# Patient Record
Sex: Female | Born: 1935 | Race: White | Hispanic: No | Smoking: Former smoker
Health system: Southern US, Community
[De-identification: ages and names within clinical notes are randomized; demographics above are authoritative.]

## PROBLEM LIST (undated history)

## (undated) DIAGNOSIS — N289 Disorder of kidney and ureter, unspecified: Secondary | ICD-10-CM

## (undated) DIAGNOSIS — F329 Major depressive disorder, single episode, unspecified: Secondary | ICD-10-CM

## (undated) DIAGNOSIS — I10 Essential (primary) hypertension: Secondary | ICD-10-CM

## (undated) DIAGNOSIS — J189 Pneumonia, unspecified organism: Secondary | ICD-10-CM

## (undated) DIAGNOSIS — G2581 Restless legs syndrome: Secondary | ICD-10-CM

## (undated) DIAGNOSIS — N318 Other neuromuscular dysfunction of bladder: Secondary | ICD-10-CM

## (undated) DIAGNOSIS — R413 Other amnesia: Secondary | ICD-10-CM

## (undated) DIAGNOSIS — G4489 Other headache syndrome: Secondary | ICD-10-CM

## (undated) DIAGNOSIS — N183 Chronic kidney disease, stage 3 (moderate): Secondary | ICD-10-CM

## (undated) DIAGNOSIS — M150 Primary generalized (osteo)arthritis: Secondary | ICD-10-CM

## (undated) DIAGNOSIS — M810 Age-related osteoporosis without current pathological fracture: Secondary | ICD-10-CM

## (undated) DIAGNOSIS — K219 Gastro-esophageal reflux disease without esophagitis: Secondary | ICD-10-CM

## (undated) DIAGNOSIS — S72009A Fracture of unspecified part of neck of unspecified femur, initial encounter for closed fracture: Secondary | ICD-10-CM

## (undated) DIAGNOSIS — I739 Peripheral vascular disease, unspecified: Secondary | ICD-10-CM

## (undated) DIAGNOSIS — H409 Unspecified glaucoma: Secondary | ICD-10-CM

## (undated) DIAGNOSIS — R350 Frequency of micturition: Secondary | ICD-10-CM

## (undated) DIAGNOSIS — N3281 Overactive bladder: Secondary | ICD-10-CM

## (undated) HISTORY — DX: Other neuromuscular dysfunction of bladder: N31.8

## (undated) HISTORY — DX: Age-related osteoporosis without current pathological fracture: M81.0

## (undated) HISTORY — DX: Gastro-esophageal reflux disease without esophagitis: K21.9

## (undated) HISTORY — DX: Other amnesia: R41.3

## (undated) HISTORY — DX: Disorder of kidney and ureter, unspecified: N28.9

## (undated) HISTORY — DX: Restless legs syndrome: G25.81

## (undated) HISTORY — DX: Other headache syndrome: G44.89

## (undated) HISTORY — DX: Frequency of micturition: R35.0

---
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
## 1984-04-04 HISTORY — PX: ROTATOR CUFF REPAIR: SHX139

## 1999-04-05 HISTORY — PX: ROTATOR CUFF REPAIR: SHX139

## 2003-06-11 ENCOUNTER — Ambulatory Visit (HOSPITAL_COMMUNITY): Admission: RE | Admit: 2003-06-11 | Discharge: 2003-06-11 | Payer: Self-pay | Admitting: Internal Medicine

## 2003-07-31 ENCOUNTER — Ambulatory Visit (HOSPITAL_COMMUNITY): Admission: RE | Admit: 2003-07-31 | Discharge: 2003-07-31 | Payer: Self-pay | Admitting: Vascular Surgery

## 2004-04-04 HISTORY — PX: CARPAL TUNNEL RELEASE: SHX101

## 2005-06-01 ENCOUNTER — Encounter: Admission: RE | Admit: 2005-06-01 | Discharge: 2005-06-01 | Payer: Self-pay | Admitting: Internal Medicine

## 2005-08-09 ENCOUNTER — Encounter: Admission: RE | Admit: 2005-08-09 | Discharge: 2005-08-09 | Payer: Self-pay | Admitting: Internal Medicine

## 2006-05-04 ENCOUNTER — Encounter: Admission: RE | Admit: 2006-05-04 | Discharge: 2006-05-04 | Payer: Self-pay | Admitting: *Deleted

## 2006-08-01 ENCOUNTER — Ambulatory Visit: Payer: Self-pay | Admitting: Vascular Surgery

## 2006-08-07 ENCOUNTER — Encounter: Payer: Self-pay | Admitting: Vascular Surgery

## 2006-08-07 ENCOUNTER — Ambulatory Visit: Payer: Self-pay | Admitting: Vascular Surgery

## 2006-08-07 ENCOUNTER — Inpatient Hospital Stay (HOSPITAL_COMMUNITY): Admission: EM | Admit: 2006-08-07 | Discharge: 2006-08-13 | Payer: Self-pay | Admitting: Emergency Medicine

## 2006-08-07 ENCOUNTER — Encounter (INDEPENDENT_AMBULATORY_CARE_PROVIDER_SITE_OTHER): Payer: Self-pay | Admitting: Cardiology

## 2006-08-22 ENCOUNTER — Encounter: Admission: RE | Admit: 2006-08-22 | Discharge: 2006-08-22 | Payer: Self-pay | Admitting: *Deleted

## 2008-08-07 ENCOUNTER — Encounter: Admission: RE | Admit: 2008-08-07 | Discharge: 2008-08-07 | Payer: Self-pay | Admitting: *Deleted

## 2009-02-09 ENCOUNTER — Encounter: Admission: RE | Admit: 2009-02-09 | Discharge: 2009-02-09 | Payer: Self-pay | Admitting: Internal Medicine

## 2009-05-02 ENCOUNTER — Inpatient Hospital Stay (HOSPITAL_COMMUNITY): Admission: EM | Admit: 2009-05-02 | Discharge: 2009-05-05 | Payer: Self-pay | Admitting: Emergency Medicine

## 2009-08-27 ENCOUNTER — Encounter: Admission: RE | Admit: 2009-08-27 | Discharge: 2009-08-27 | Payer: Self-pay | Admitting: Internal Medicine

## 2009-08-27 LAB — HM MAMMOGRAPHY: HM Mammogram: NORMAL

## 2010-01-26 ENCOUNTER — Ambulatory Visit (HOSPITAL_COMMUNITY): Admission: RE | Admit: 2010-01-26 | Discharge: 2010-01-26 | Payer: Self-pay | Admitting: Internal Medicine

## 2010-01-30 ENCOUNTER — Ambulatory Visit: Payer: Self-pay | Admitting: Cardiology

## 2010-01-30 ENCOUNTER — Inpatient Hospital Stay (HOSPITAL_COMMUNITY): Admission: EM | Admit: 2010-01-30 | Discharge: 2010-02-02 | Payer: Self-pay | Admitting: Emergency Medicine

## 2010-01-31 ENCOUNTER — Encounter (INDEPENDENT_AMBULATORY_CARE_PROVIDER_SITE_OTHER): Payer: Self-pay | Admitting: Internal Medicine

## 2010-04-25 ENCOUNTER — Encounter: Payer: Self-pay | Admitting: *Deleted

## 2010-06-15 LAB — BASIC METABOLIC PANEL
BUN: 18 mg/dL (ref 6–23)
Calcium: 9.4 mg/dL (ref 8.4–10.5)
GFR calc non Af Amer: 56 mL/min — ABNORMAL LOW (ref >60)
Glucose, Bld: 101 mg/dL — ABNORMAL HIGH (ref 70–99)
Sodium: 141 mEq/L (ref 135–145)

## 2010-06-16 LAB — CARDIAC PANEL(CRET KIN+CKTOT+MB+TROPI)
CK, MB: 3.3 ng/mL (ref 0.3–4.0)
CK, MB: 3.9 ng/mL (ref 0.3–4.0)
Troponin I: 0.02 ng/mL (ref 0.00–0.06)
Troponin I: 0.02 ng/mL (ref 0.00–0.06)

## 2010-06-16 LAB — URINE CULTURE
Colony Count: NO GROWTH
Culture  Setup Time: 201110291426

## 2010-06-16 LAB — CBC
HCT: 35.5 % — ABNORMAL LOW (ref 36.0–46.0)
Hemoglobin: 11.8 g/dL — ABNORMAL LOW (ref 12.0–15.0)
MCH: 31.4 pg (ref 26.0–34.0)
MCH: 31.4 pg (ref 26.0–34.0)
MCH: 31.4 pg (ref 26.0–34.0)
MCHC: 33.9 g/dL (ref 30.0–36.0)
MCHC: 33.9 g/dL (ref 30.0–36.0)
MCHC: 34 g/dL (ref 30.0–36.0)
MCV: 92.3 fL (ref 78.0–100.0)
MCV: 92.5 fL (ref 78.0–100.0)
MCV: 92.7 fL (ref 78.0–100.0)
Platelets: 119 10*3/uL — ABNORMAL LOW (ref 150–400)
RBC: 3.77 MIL/uL — ABNORMAL LOW (ref 3.87–5.11)
RDW: 13.4 % (ref 11.5–15.5)
RDW: 13.7 % (ref 11.5–15.5)

## 2010-06-16 LAB — BASIC METABOLIC PANEL
BUN: 21 mg/dL (ref 6–23)
BUN: 24 mg/dL — ABNORMAL HIGH (ref 6–23)
BUN: 24 mg/dL — ABNORMAL HIGH (ref 6–23)
CO2: 21 mEq/L (ref 19–32)
CO2: 21 mEq/L (ref 19–32)
CO2: 22 mEq/L (ref 19–32)
Calcium: 8.7 mg/dL (ref 8.4–10.5)
Chloride: 106 mEq/L (ref 96–112)
Chloride: 111 mEq/L (ref 96–112)
Chloride: 111 mEq/L (ref 96–112)
Creatinine, Ser: 1.01 mg/dL (ref 0.4–1.2)
Creatinine, Ser: 1.22 mg/dL — ABNORMAL HIGH (ref 0.4–1.2)
Glucose, Bld: 90 mg/dL (ref 70–99)
Potassium: 3.6 mEq/L (ref 3.5–5.1)

## 2010-06-16 LAB — CULTURE, BLOOD (ROUTINE X 2)

## 2010-06-16 LAB — CK TOTAL AND CKMB (NOT AT ARMC)
CK, MB: 3.5 ng/mL (ref 0.3–4.0)
Total CK: 56 U/L (ref 7–177)

## 2010-06-16 LAB — URINALYSIS, ROUTINE W REFLEX MICROSCOPIC
Bilirubin Urine: NEGATIVE
Nitrite: NEGATIVE
Protein, ur: 30 mg/dL — AB
Urobilinogen, UA: 1 mg/dL (ref 0.0–1.0)

## 2010-06-16 LAB — LACTIC ACID, PLASMA: Lactic Acid, Venous: 1.4 mmol/L (ref 0.5–2.2)

## 2010-06-16 LAB — VITAMIN B12: Vitamin B-12: 402 pg/mL (ref 211–911)

## 2010-06-20 LAB — BASIC METABOLIC PANEL
Calcium: 8.7 mg/dL (ref 8.4–10.5)
Creatinine, Ser: 0.87 mg/dL (ref 0.4–1.2)
GFR calc Af Amer: >60 mL/min (ref >60)
GFR calc non Af Amer: >60 mL/min (ref >60)
Glucose, Bld: 110 mg/dL — ABNORMAL HIGH (ref 70–99)
Sodium: 141 mEq/L (ref 135–145)

## 2010-06-20 LAB — COMPREHENSIVE METABOLIC PANEL
ALT: 10 U/L (ref 0–35)
ALT: 14 U/L (ref 0–35)
AST: 21 U/L (ref 0–37)
Albumin: 3.2 g/dL — ABNORMAL LOW (ref 3.5–5.2)
Albumin: 4.3 g/dL (ref 3.5–5.2)
Alkaline Phosphatase: 50 U/L (ref 39–117)
Alkaline Phosphatase: 72 U/L (ref 39–117)
BUN: 33 mg/dL — ABNORMAL HIGH (ref 6–23)
BUN: 55 mg/dL — ABNORMAL HIGH (ref 6–23)
Chloride: 111 mEq/L (ref 96–112)
GFR calc Af Amer: 34 mL/min — ABNORMAL LOW (ref >60)
Glucose, Bld: 92 mg/dL (ref 70–99)
Potassium: 4 mEq/L (ref 3.5–5.1)
Potassium: 4.1 mEq/L (ref 3.5–5.1)
Sodium: 142 mEq/L (ref 135–145)
Total Bilirubin: 1.5 mg/dL — ABNORMAL HIGH (ref 0.3–1.2)
Total Protein: 7.4 g/dL (ref 6.0–8.3)

## 2010-06-20 LAB — CULTURE, BLOOD (ROUTINE X 2)

## 2010-06-20 LAB — POCT CARDIAC MARKERS
CKMB, poc: 2 ng/mL (ref 1.0–8.0)
Troponin i, poc: <0.05 ng/mL (ref 0.00–0.09)

## 2010-06-20 LAB — CBC
HCT: 37.5 % (ref 36.0–46.0)
Hemoglobin: 12.5 g/dL (ref 12.0–15.0)
Hemoglobin: 12.8 g/dL (ref 12.0–15.0)
Platelets: 134 10*3/uL — ABNORMAL LOW (ref 150–400)
Platelets: 173 10*3/uL (ref 150–400)
RDW: 13.2 % (ref 11.5–15.5)
RDW: 13.3 % (ref 11.5–15.5)
WBC: 7.6 10*3/uL (ref 4.0–10.5)

## 2010-06-20 LAB — PROTIME-INR: INR: 1.03 (ref 0.00–1.49)

## 2010-06-20 LAB — URINALYSIS, ROUTINE W REFLEX MICROSCOPIC
Bilirubin Urine: NEGATIVE
Ketones, ur: NEGATIVE mg/dL
Nitrite: NEGATIVE
Specific Gravity, Urine: 1.023 (ref 1.005–1.030)
Urobilinogen, UA: 0.2 mg/dL (ref 0.0–1.0)

## 2010-06-20 LAB — DIFFERENTIAL
Basophils Relative: 0 % (ref 0–1)
Monocytes Absolute: 0.7 10*3/uL (ref 0.1–1.0)
Monocytes Relative: 7 % (ref 3–12)
Neutro Abs: 9.8 10*3/uL — ABNORMAL HIGH (ref 1.7–7.7)

## 2010-06-20 LAB — SODIUM, URINE, RANDOM: Sodium, Ur: 48 mEq/L

## 2010-06-20 LAB — MICROALBUMIN, URINE: Microalb, Ur: 15.65 mg/dL — ABNORMAL HIGH (ref 0.00–1.89)

## 2010-06-20 LAB — PROTEIN, URINE, RANDOM: Total Protein, Urine: 39 mg/dL

## 2010-06-20 LAB — SEDIMENTATION RATE: Sed Rate: 7 mm/hr (ref 0–22)

## 2010-08-17 NOTE — Discharge Summary (Signed)
NAMEREISE, HIETALA                ACCOUNT NO.:  [id number]   MEDICAL RECORD NO.:  [id number]          PATIENT TYPE:  INP   LOCATION:  6739                         FACILITY:  [facility]   PHYSICIAN:  Barnetta Chapel, MDDATE OF BIRTH:  12-Oct-1917   DATE OF ADMISSION:  08/07/2006  DATE OF DISCHARGE:  08/13/2006                               DISCHARGE SUMMARY   </PRIMARY CARE PHYSICIAN  Nicholes Calamity MD   ADMISSION DIAGNOSES:  1. Pneumonia.  2. Hypoxemia.  3. Hypokalemia.  4. Hyponatremia.  5. Hypertension.   DISCHARGE DIAGNOSES:  1. Left lower lobe pneumonia.  2. Hypoxemia secondary to pneumonia, resolved.  3. Hypokalemia, resolved.  4. Hyponatremia, resolved.  5. Hypertension.  6. Acute confusional state (delirium), likely secondary to underlying      pneumonia.   DISCHARGE MEDICATIONS:  1. Hytrin 2 mg p.o. 1 daily.  2. Pletal 50 mg p.o. b.i.d.  3. Lisinopril 40 mg 1 daily.  4. Omeprazole 20 mg p.o. 1 daily.  5. Requip 0.5 mg p.o. nightly.  6. Aspirin 325 mg p.o. 1 daily.  7. Pamelor eye drops 0.25% 1 drop to both eyes once daily.  8. Travatan 0.004% 1 drop both eyes nightly.  9. Celexa 20 mg p.o. once daily.  10.Clonidine 0.1 mg p.o. 3 times a day.  11.__________ p.o. once daily.  12.__________ 100 mg daily.  13.Risperidone 0.25 mg p.o. b.i.d. x1 week.  14.Levaquin 750 mg p.o. once daily for only 5 days and then stop.   CONSULTATIONS:  None.   IMAGING STUDIES:  1. CT of the chest done Aug 08, 2006.  This revealed left lower lobe      consolidation consistent with infectious infiltrate with lateral      adenopathy and narrowing of the pulmonary arteries.  No masses      identified.  Please repeat CT scan of the chest in 1-3 months to      ensure improvement in the consolidation and to rule out any      underlying mass.  The CT chest also showed fluid adjacent to the      liver.  It showed dilated right collecting system (this is      apparently congenital  according to the patient records.)  2. chest x-ray done the 5th of May showed pleural-based density on the      right chest that could represent possible pneumonia versus a mass.  3. CT scan of the head done on Aug 07, 2006, revealed atrophy and      chronic small vessel ischemic changes, no acute abnormality.  4. CT scan of the abdomen and pelvis done on Aug 09, 2006, revealed      dilated right renal pelvicaliceal system most likely secondary to a      right UPJ stenosis.  Diffuse edematous changes within the      subcutaneous fat and intraperitoneal fat, probable perineal      __________.  Mild splenomegaly.  Small simple-appearing left lobe      hepatic cyst.  Bilateral pleural effusions and left lower lobe  consolidative changes.  Diffuse changes of lumbar spondylosis.      Sigmoid colonic diverticulosis without evidence of diverticulitis.   BRIEF HISTORY AND HOSPITAL COURSE:  Please refer to the H&P done on Aug 07, 2006.  The patient is an 77 year old woman with multiple medical  problems.  The patient presented with a 2-day history of feeling sick,  not feeling well with cramping of the legs.  Appetite was also poor  prior to admission.  She had a cough which was productive of yellowish  sputum.  She was short of breath.  On admission, the patient's blood  pressure was 172/83 mmHg.  The temperature was 101.3.  The patient was  admitted to telemetry floor.  She was started on IV Avelox 400 mg once  daily.  She was adequately rehydrated.  She had a potassium of 3.3 on  admission, and sodium of 130.  Potassium was repleted.  With above  management, the hypoxia improved.  The patient's fever also resolved.  A  chest x-ray done on admission revealed a possible left-sided pneumonia.  CT scan was essentially as above.  A CT of the abdomen and pelvis was  done to further evaluate the findings on the CT of the chest.  Please  see above.   The patient has done well and will be discharged  back home to the care  of the primary care physician.  During her stay in the hospital, she  developed acute confusion.  She pulled her IV lines out.  She was  started on risperidone 0.25 mg once daily.  The acute confusion has  resolved.  She will be continued on risperidone for the next one week.   DISCHARGE PLANS:  1. Discharge patient back home today.  2. Follow up with primary care Victory Strollo in 1 week.  3. Cardiac diet.  4. Activity as tolerated.  5. Primary care Vanice Rappa should consider repeating CT scan in 6-12      weeks to ensure clearing of the left lower lobe consolidation.      Barnetta Chapel, MD  Electronically Signed     SIO/MEDQ  D:  08/13/2006  T:  08/13/2006  Job:  [id number]   cc:   D. Oley Balm III, M.D.

## 2010-08-20 NOTE — Op Note (Signed)
NAME:  Carrie Moran, Carrie Moran                          ACCOUNT NO.:  [id number]   MEDICAL RECORD NO.:  [id number]                   PATIENT TYPE:  OIB   LOCATION:  2899                                 FACILITY:  [facility]   PHYSICIAN:  Quita Skye. Hart Rochester, M.D.               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   PROCEDURE:  Abdominal aortogram with bilateral lower extremities run-off via  right common femoral approach.   SURGEON:  Quita Skye. Hart Rochester, M.D.   ANESTHESIA:  Local Xylocaine.   CONTRAST:  125 cc.   COMPLICATIONS:  None.  Labetalol 20 mg given intravenously for hypertension.   DESCRIPTION OF PROCEDURE:  The patient was taken to the [facility]  Peripheral Endovascular Lab, placed in the supine position at which time  both groins were prepped with Betadine scrub and solution, draped in routine  sterile manner. After infiltration with 1% Xylocaine, the right common  femoral artery was entered percutaneously using standard approach.  Guide  wire was passed into the suprarenal aorta under fluoroscopic guidance.   A 5 French sheath and dilator were passed over the guide wire, dilator  removed and a standard pigtail catheter positioned in the  suprarenal aorta.  Flush abdominal aortogram was then performed, injecting  20 cc of contrast at 20 cc/second.  This revealed the aorta to be widely  patent with single renal arteries bilaterally which had no areas of  stenosis.  Right kidney was slightly smaller than the left.  There was a  normal aortic bifurcation with mild plaque formation and both common  external and internal iliac arteries were also widely patent.  The catheter  was delivered into  the terminal aorta and bilateral lower extremities run-  off performed, injecting 98 cc of contrast at 8 cc/second.   This revealed the right leg to have a widely patent common femoral artery.  There was mild superficial femoral occlusive disease with no areas of  significant stenosis and 3-vessel run-off on the right through small, tibial  vessels.  On the left side, the common femoral and profunda femoris arteries  were widely patent. The superficial femoral artery was widely patent  proximally but became moderately to severely diseased in the proximal to mid-  thigh throughout with plaque running through this whole entire area.  The 2  areas of most significant stenosis proximally, 1 at approximately 80% and  the other at approximately 90%.  Distal superficial femoral and popliteal  arteries were widely patent with 3 vessel run-off on the left. Additional  views were obtained using a peak whole technique.   When this was completed, catheter was removed  over the guide wire, sheaths  removed, adequate compression applied. No complications ensued.   FINDINGS:  1. Left superficial femoral occlusive disease with 2 areas of moderate to     severe stenosis approximating 80-90% in     the proximal third of the thigh with normal run-off distally.  2. Mild right superficial femoral occlusive disease with normal run-off     distally.  3. Normal aortoiliac systems.                                               Quita Skye Hart Rochester, M.D.    JDL/MEDQ  D:  07/31/2003  T:  07/31/2003  Job:  [id number]

## 2010-08-20 NOTE — H&P (Signed)
Carrie Moran, Carrie Moran                ACCOUNT NO.:  [id number]   MEDICAL RECORD NO.:  [id number]          PATIENT TYPE:  INP   LOCATION:  6739                         FACILITY:  [facility]   PHYSICIAN:  Michelene Gardener, MD    DATE OF BIRTH:  09/29/1917   DATE OF ADMISSION:  08/07/2006  DATE OF DISCHARGE:                              HISTORY & PHYSICAL   PRIMARY CARE PHYSICIAN:  The patient has been seen at [facility] at [facility] and has been followed by Dr.  Deanne Coffer.   CHIEF COMPLAINT:  The patient was brought with multiple complaints  including feeling sick and poor appetite and cough for the last 2 days.   HISTORY OF PRESENT ILLNESS:  This is an 77 year old Caucasian female  with past medical history of multiple medical problems.  She presented  with the above-mentioned complaint.  Her daughter is present at the  bedside, and information was obtained from herself and her daughter.  For the last 2 days, she stated that she had been feeling sick and not  feeling well and had a lot of campy legs.  She was also complaining of  poor appetite.  Her symptoms progressed, and she developed increasing  cough which started as a dry cough and progressed to production with  white sputum initially and then developed into yellow sputum. That was  associated with increasing shortness of breath that has been going on  for the last 2 days and worsened today.  She denies wheeze.  There is no  fever, and there is no chest pain.  She came into the ER today because  she felt more febrile, and shortness of breath was worsening.   Initial vital signs in the ER showed blood pressure 172/83, temperature  101.3, pulse 95, respiratory rate 16.  Currently she is lying down with  no acute distress and was complaining of mild shortness of breath on and  off.   PAST MEDICAL HISTORY:  Significant for:  1. Arthritis.  2. History of DVT.  The patient is not taking Coumadin.  3. Hypertension.  4.  Migraine.  5. Renal insufficiency.  6. Osteoporosis.  7. GERD.  8. Restless leg syndrome.  9. History of glaucoma.   PAST SURGICAL HISTORY:  Denied.   MEDICATIONS:  1. Terazosin 4 mg p.o. once daily.  2. Actonel 35 mg p.o. once daily.  3. Lisinopril 40 mg p.o. once daily.  4. Omeprazole 20 mg p.o. twice daily.  5. Tramadol 50 mg as needed.  6. Requip 0.5 mg p.o. once daily.  7. Os-Cal Plus D 1 tablet p.o. twice daily.  8. Aspirin 81 mg p.o. once daily.  9. Timolol 0.5 mL 1 drop both eyes twice daily.  10.Travatan 1 drop once daily.  11.Citalopram, unknown dose, one daily.   SOCIAL HISTORY:  The patient denies smoking, denies alcohol drinking,  and denies recreational drugs.   FAMILY HISTORY:  Significant for hypertension on her father's side.  History of COPD on her mother's side.   REVIEW OF SYSTEMS:  CONSTITUTIONAL: Positive for fatigability, chronic  headache,  and poor appetite.  EYES: No blurred vision.  ENT:  Not  icterus.  No difficulty swallowing.  RESPIRATORY:  Positive for  productive cough with yellow sputum and increasing shortness of breath.  There are no wheezes and no chest pain.  CARDIOVASCULAR:  No chest pain,  no palpitations, no syncope.  GI: No nausea, vomiting, diarrhea.  GU:  No dysuria, hematuria.  ENDOCRINE:  No polyuria, no nocturia.  HEMATOLOGIC:  No bruising, no bleeding.  INFECTIOUS DISEASE:  No rashes,  no lesions.  NEUROLOGIC: No tingling and no weakness.  The rest of  systems reviewed and negative.   PHYSICAL EXAMINATION:  VITAL SIGNS:  Temperature 101.3, heart rate 75,  respiratory rate 18, blood pressure 172/83.  GENERAL APPEARANCE:  This is an elderly Caucasian female with mild  shortness of breath.  HEENT:  Conjunctivae showed no erythema and no pallor.  Pupils equal,  round, and reactive to light.  She has some hearing impairment.  Her  ears did not show any evidence of infection.  No nose bleed and no  infection.  Oral mucosa is dry,  and there is no pharyngeal erythema.  NECK:  Supple.  There is no JVD, and there is no carotid bruit.  Thyroid  showed no enlargement.  CARDIOVASCULAR: S1 and S2 are regular.  There is a positive murmur best  heard in the mitral area, and there are no gallops.  RESPIRATORY:  The patient is breathing around 20.  There is no use of  accessory muscles.  No intracostal retractions.  There is increased  rhonchi, especially in the left lower lobe area with mild rhonchi in the  right upper area.  There are no wheezes and no rales.  ABDOMEN:  Soft.  There is no distention, no tenderness.  No  hepatosplenomegaly.  Bowel sounds normal.  EXTREMITIES: Lower extremities positive for +1 edema.  There are no  varicose veins.  SKIN:  No rash, no erythema.  NEUROLOGIC:  Cranial nerves are intact.  There is no evidence of  neurological abnormalities.   LABORATORY RESULTS:  WBC 5.2, hemoglobin 12.4, hematocrit 36.5, MCV  91.5, platelet count 95.  Sodium 134, potassium 3.3, chloride 100, BUN  19, creatinine 1.1.   Chest x-ray showed infiltrate in the right lower lobe.   EKG is normal sinus rhythm, and there is no reversible ischemia.   ASSESSMENT:  1. Pneumonia.  Will admit this patient to the floor.  Already received      1 dose of antibiotics in the ER, and I will continue her on Avelox      400 mg IV once daily.  Two sets of blood cultures already from the      ER, so I will get a sputum culture.  Will apply oxygen as needed to      keep saturation above 92.  Will also give nebulizer treatments as      needed.  2. Hypoxemia secondary to underlying pneumonia.  Will treat with      oxygen as needed.  3. Hypokalemia.  Potassium is 3.3.  All of the medicines taken by the      patient will not cause hypokalemia.  I will get magnesium levels to      rule out the possibility of hypomagnesemia.  Will also give     potassium chloride 40 mEq x1 and will repeat potassium level in the      morning.  4.  Hyponatremia.  This is very mild hyponatremia  with sodium of 134      and no need for supplementation at this time, and we will just      watch sodium level tomorrow.  5. Hypertension.  Blood pressure has been elevated initially which is      around 195 systolic on admission. This might be attributed to the      anxiety of the patient.  Repeat blood pressure is around 158/75.      The patient did not take her medications today.  I will restart      those and then follow her blood pressure and make adjustments      according to her hospital course.   DISPOSITION:  Otherwise, other medical conditions remain stable.  The  patient was maintained on same medications as taken at home.  I have  discussed this case with the patient and the family member available at  bedside.  The condition, prognosis, and management were explained to  them, and their questions were answered.   Time:  1 hour.      Michelene Gardener, MD  Electronically Signed     NAE/MEDQ  D:  08/07/2006  T:  08/07/2006  Job:  [id number]   cc:   D. Oley Balm III, M.D.

## 2012-10-16 ENCOUNTER — Ambulatory Visit (INDEPENDENT_AMBULATORY_CARE_PROVIDER_SITE_OTHER): Payer: Medicare Other | Admitting: Internal Medicine

## 2012-10-16 ENCOUNTER — Encounter: Payer: Self-pay | Admitting: Internal Medicine

## 2012-10-16 VITALS — BP 150/72 | HR 91 | Temp 97.4°F | Resp 14 | Wt 97.8 lb

## 2012-10-16 DIAGNOSIS — N3281 Overactive bladder: Secondary | ICD-10-CM

## 2012-10-16 DIAGNOSIS — I10 Essential (primary) hypertension: Secondary | ICD-10-CM

## 2012-10-16 DIAGNOSIS — R1031 Right lower quadrant pain: Secondary | ICD-10-CM

## 2012-10-16 DIAGNOSIS — N318 Other neuromuscular dysfunction of bladder: Secondary | ICD-10-CM

## 2012-10-16 MED ORDER — OXYBUTYNIN CHLORIDE 5 MG PO TABS: 5.0000 mg | ORAL_TABLET | Freq: Every day | ORAL | Status: DC

## 2012-10-16 MED ORDER — METOPROLOL TARTRATE 25 MG PO TABS: ORAL_TABLET | ORAL | Status: DC

## 2012-10-16 NOTE — Progress Notes (Signed)
Patient ID: Carrie Moran, female   DOB: 1918-02-05, 79 y.o.   MRN: [id number]  Chief Complaint  Patient presents with  . Hypertension  . urinary complaints   Allergies not on file  HPI- Patient here with her son. She normally follows in [place] hospital.  Elevated bp- has been running high intermittently. Denies any chest pain, headache, SOB. Taking clonidine 0.1 mg bid , amlodipine 2.5 mg daily and lopressor 50 mg once a day for now. Was on lopressor 50 mg bid ande was decreased to 50 mg once a day 2 month back- has ran out of it and [place] had thought of weaning her off it. Will start her back on it. she is also on amlodipine 2.5 mg daily for 2 months and has ran out of it. bp has been running high  Urinary frequency- increased frequency for sometime. No burning or pain. No blood in the urine. Also had severe right groin area pain 2 days back, non radiating. Feels strong odor to her urine. No pain at present  Review of Systems  Constitutional: Negative for fever, chills and diaphoresis.  Respiratory: Negative for shortness of breath.   Cardiovascular: Negative for chest pain and palpitations.  Gastrointestinal: Negative for heartburn, nausea and vomiting.  Musculoskeletal: Negative for myalgias.  Skin: Negative for rash.  Neurological: Negative for dizziness.  Psychiatric/Behavioral: Negative for depression.   BP 150/72  Pulse 91  Temp(Src) 97.4 F (36.3 C) (Oral)  Resp 14  Wt 97 lb 12.8 oz (44.362 kg)  SpO2 97%  gen- elderly frail lady in NAD HEENT- no pallor, no icterus, no LAD, MMM CVS- ns 1,s2, rrr respi CTAB abdo- no suprapubic tenderness, no abdominal tenderness, bs+, soft, no flank tenderness Ext- able to move all 4  ASSESSMENT/PLAN-  HTN- Stop the amlodipine Increase the lopressor to 50 mg in am and 25 mg in pm Continue prn clonidine Monitor home bp Reassess if no improvement  Urinary frequency- likely from overactive bladder. Will have her on oxybutynin 5 mg daily.  Monitor further.   RLQ pain-Due to flank and groin pain few days back and recurrent uti will get renal usg to rule out hydronephrosis and renal stone

## 2012-10-16 NOTE — Patient Instructions (Signed)
Stop taking amlodipine  Start new dose of metoprolol and oxybutynin

## 2012-10-17 DIAGNOSIS — R1031 Right lower quadrant pain: Secondary | ICD-10-CM | POA: Insufficient documentation

## 2012-10-17 DIAGNOSIS — I10 Essential (primary) hypertension: Secondary | ICD-10-CM | POA: Insufficient documentation

## 2012-10-17 DIAGNOSIS — N3281 Overactive bladder: Secondary | ICD-10-CM | POA: Insufficient documentation

## 2012-10-17 HISTORY — DX: Overactive bladder: N32.81

## 2012-10-19 ENCOUNTER — Telehealth: Payer: Self-pay | Admitting: Geriatric Medicine

## 2012-10-19 NOTE — Telephone Encounter (Signed)
Brett Canales called and said that the patient is in pain again and her urine is dark. Due to our office closing at 12, I suggested that he take her somewhere today to go get looked at. He said he would take her.

## 2012-10-23 ENCOUNTER — Ambulatory Visit
Admission: RE | Admit: 2012-10-23 | Discharge: 2012-10-23 | Disposition: A | Discharge status: Home / Self Care | Payer: Medicare Other | Source: Ambulatory Visit | Attending: Internal Medicine | Admitting: Internal Medicine

## 2012-10-23 DIAGNOSIS — R1031 Right lower quadrant pain: Secondary | ICD-10-CM

## 2012-10-24 ENCOUNTER — Other Ambulatory Visit: Payer: Self-pay | Admitting: Geriatric Medicine

## 2012-10-24 MED ORDER — CLONIDINE HCL 0.1 MG PO TABS: 0.1000 mg | ORAL_TABLET | Freq: Two times a day (BID) | ORAL | Status: AC

## 2012-10-30 ENCOUNTER — Ambulatory Visit: Payer: Medicare Other | Admitting: Internal Medicine

## 2012-10-30 DIAGNOSIS — Z0289 Encounter for other administrative examinations: Secondary | ICD-10-CM

## 2012-10-31 ENCOUNTER — Encounter (HOSPITAL_COMMUNITY): Payer: Self-pay | Admitting: *Deleted

## 2012-10-31 ENCOUNTER — Emergency Department (HOSPITAL_COMMUNITY)
Admission: EM | Admit: 2012-10-31 | Discharge: 2012-10-31 | Discharge status: Against Medical Advice | Payer: Medicare Other | Attending: Emergency Medicine | Admitting: Emergency Medicine

## 2012-10-31 DIAGNOSIS — S0993XA Unspecified injury of face, initial encounter: Secondary | ICD-10-CM | POA: Insufficient documentation

## 2012-10-31 DIAGNOSIS — I10 Essential (primary) hypertension: Secondary | ICD-10-CM | POA: Insufficient documentation

## 2012-10-31 DIAGNOSIS — Y9289 Other specified places as the place of occurrence of the external cause: Secondary | ICD-10-CM | POA: Insufficient documentation

## 2012-10-31 DIAGNOSIS — S46909A Unspecified injury of unspecified muscle, fascia and tendon at shoulder and upper arm level, unspecified arm, initial encounter: Secondary | ICD-10-CM | POA: Insufficient documentation

## 2012-10-31 DIAGNOSIS — W010XXA Fall on same level from slipping, tripping and stumbling without subsequent striking against object, initial encounter: Secondary | ICD-10-CM | POA: Insufficient documentation

## 2012-10-31 DIAGNOSIS — S4980XA Other specified injuries of shoulder and upper arm, unspecified arm, initial encounter: Secondary | ICD-10-CM | POA: Insufficient documentation

## 2012-10-31 DIAGNOSIS — Z79899 Other long term (current) drug therapy: Secondary | ICD-10-CM | POA: Insufficient documentation

## 2012-10-31 DIAGNOSIS — S199XXA Unspecified injury of neck, initial encounter: Secondary | ICD-10-CM | POA: Insufficient documentation

## 2012-10-31 DIAGNOSIS — Y9389 Activity, other specified: Secondary | ICD-10-CM | POA: Insufficient documentation

## 2012-10-31 HISTORY — DX: Essential (primary) hypertension: I10

## 2012-10-31 NOTE — ED Notes (Signed)
The pt fell yesterday  When she tripped going up steps.  She has had neck and lt shoulder pain since then

## 2012-11-01 ENCOUNTER — Ambulatory Visit (INDEPENDENT_AMBULATORY_CARE_PROVIDER_SITE_OTHER): Payer: Medicare Other | Admitting: Nurse Practitioner

## 2012-11-01 ENCOUNTER — Ambulatory Visit
Admission: RE | Admit: 2012-11-01 | Discharge: 2012-11-01 | Disposition: A | Discharge status: Home / Self Care | Payer: Medicare Other | Source: Ambulatory Visit | Attending: Nurse Practitioner | Admitting: Nurse Practitioner

## 2012-11-01 ENCOUNTER — Encounter: Payer: Self-pay | Admitting: Nurse Practitioner

## 2012-11-01 ENCOUNTER — Other Ambulatory Visit: Payer: Self-pay | Admitting: Geriatric Medicine

## 2012-11-01 VITALS — BP 148/88 | HR 56 | Temp 97.6°F | Resp 14 | Wt 103.2 lb

## 2012-11-01 DIAGNOSIS — S42002A Fracture of unspecified part of left clavicle, initial encounter for closed fracture: Secondary | ICD-10-CM

## 2012-11-01 DIAGNOSIS — T148XXA Other injury of unspecified body region, initial encounter: Secondary | ICD-10-CM

## 2012-11-01 DIAGNOSIS — W19XXXA Unspecified fall, initial encounter: Secondary | ICD-10-CM

## 2012-11-01 DIAGNOSIS — S42009A Fracture of unspecified part of unspecified clavicle, initial encounter for closed fracture: Secondary | ICD-10-CM

## 2012-11-01 MED ORDER — TRAMADOL HCL 50 MG PO TABS: 50.0000 mg | ORAL_TABLET | Freq: Three times a day (TID) | ORAL | Status: DC | PRN

## 2012-11-01 NOTE — Patient Instructions (Addendum)
Will get chest xray and xray of left shoulder  Can use aleve 1 tablet every 8 hours for pain (use for 5 days then stop) Use ice and heat for pain  May get OTC salonpas patch to apply to painful area or use one of the following-- biofreeze/icyhot/bengay Cont using tylenol 650 mg every 6 hours ( do not use more than 3000 mg in 24 hours)   Contusion A contusion is a deep bruise. Contusions are the result of an injury that caused bleeding under the skin. The contusion may turn blue, purple, or yellow. Minor injuries will give you a painless contusion, but more severe contusions may stay painful and swollen for a few weeks.  CAUSES  A contusion is usually caused by a blow, trauma, or direct force to an area of the body. SYMPTOMS   Swelling and redness of the injured area.  Bruising of the injured area.  Tenderness and soreness of the injured area.  Pain. DIAGNOSIS  The diagnosis can be made by taking a history and physical exam. An X-ray, CT scan, or MRI may be needed to determine if there were any associated injuries, such as fractures. TREATMENT  Specific treatment will depend on what area of the body was injured. In general, the best treatment for a contusion is resting, icing, elevating, and applying cold compresses to the injured area. Over-the-counter medicines may also be recommended for pain control. Ask your caregiver what the best treatment is for your contusion. HOME CARE INSTRUCTIONS   Put ice on the injured area.  Put ice in a plastic bag.  Place a towel between your skin and the bag.  Leave the ice on for 15-20 minutes, 3-4 times a day.  Only take over-the-counter or prescription medicines for pain, discomfort, or fever as directed by your caregiver. Your caregiver may recommend avoiding anti-inflammatory medicines (aspirin, ibuprofen, and naproxen) for 48 hours because these medicines may increase bruising.  Rest the injured area.  If possible, elevate the injured area  to reduce swelling. SEEK IMMEDIATE MEDICAL CARE IF:   You have increased bruising or swelling.  You have pain that is getting worse.  Your swelling or pain is not relieved with medicines. MAKE SURE YOU:   Understand these instructions.  Will watch your condition.  Will get help right away if you are not doing well or get worse. Document Released: 12/29/2004 Document Revised: 06/13/2011 Document Reviewed: 01/24/2011 [facility] Patient Information 2014 [place], [place].

## 2012-11-01 NOTE — Progress Notes (Signed)
Patient ID: Carrie Moran, female   DOB: [birth year], 79 y.o.   MRN: [id number]   No Known Allergies  Chief Complaint  Patient presents with  . Fell 2 days ago and bruised chest and painful    HPI: Patient is a 79 y.o. female seen in the office today due to fall 2 days ago---pt does not remember what happened; granddaughter providing history and she was not there when the fall happened (son is out of town) but it is reported she was stepping up stairs; unknown what she hit or where she hit. No loss of consciousness noted. Pt is now Very bruised in her chest and with increased pain. Hx of bilateral shoulder surgeries and has limited mobility and can not lift her hands over her head or rotate her should at baseline however reports pain in her left shoulder now.  No neurological deficits. Memory has gotten much worse in the last month Review of Systems:  Review of Systems  Constitutional: Negative for malaise/fatigue.  HENT: Negative for nosebleeds, congestion, sore throat and tinnitus.   Eyes: Negative for blurred vision, double vision and pain.  Respiratory: Negative for cough and shortness of breath.   Cardiovascular: Negative for chest pain and palpitations.  Gastrointestinal: Negative for abdominal pain, diarrhea and constipation.  Genitourinary: Negative for dysuria, urgency and frequency.  Musculoskeletal: Positive for myalgias, joint pain (left shoulder pain) and falls.  Skin:       Significant briusing throughout chest   Neurological: Negative for dizziness, weakness and headaches.  Psychiatric/Behavioral: Positive for memory loss.      Past Medical History  Diagnosis Date  . Hypertension    History reviewed. No pertinent past surgical history. Social History:   reports that she has quit smoking. She does not have any smokeless tobacco history on file. She reports that she does not drink alcohol or use illicit drugs.  No family history on file.  Medications: Patient's  Medications  New Prescriptions   No medications on file  Previous Medications   ASPIRIN 81 MG TABLET    Take 81 mg by mouth daily.   CILOSTAZOL (PLETAL) 50 MG TABLET    Take 50 mg by mouth 2 (two) times daily.   CLONIDINE (CATAPRES) 0.1 MG TABLET    Take 1 tablet (0.1 mg total) by mouth 2 (two) times daily.   LATANOPROST (XALATAN) 0.005 % OPHTHALMIC SOLUTION    Place 1 drop into both eyes daily. Instill 1 drop in each eye once daily for glaucoma.   METOPROLOL TARTRATE (LOPRESSOR) 25 MG TABLET    Take 25-50 mg by mouth 2 (two) times daily. 2 tabs in the am, 1 tab in the pm   MIRTAZAPINE (REMERON) 15 MG TABLET    Take 15 mg by mouth at bedtime.   OMEPRAZOLE (PRILOSEC) 20 MG CAPSULE    Take 40 mg by mouth daily.    OXYBUTYNIN (DITROPAN) 5 MG TABLET    Take 1 tablet (5 mg total) by mouth daily.   TIMOLOL MALEATE 0.5 % (DAILY) SOLN    Place 1 drop into both eyes daily.   Modified Medications   No medications on file  Discontinued Medications   No medications on file     Physical Exam:  Filed Vitals:   11/01/12 1051  BP: 148/88  Pulse: 56  Temp: 97.6 F (36.4 C)  TempSrc: Oral  Resp: 14  Weight: 103 lb 3.2 oz (46.811 kg)  Physical Exam  Constitutional: She is well-developed, well-nourished, and  in no distress. No distress.  HENT:  Head: Normocephalic and atraumatic.  Eyes: Conjunctivae and EOM are normal. Pupils are equal, round, and reactive to light.  Neck: Normal range of motion. Neck supple. No thyromegaly present.  Cardiovascular: Normal rate, regular rhythm and normal heart sounds.   Pulmonary/Chest: Effort normal and breath sounds normal. No respiratory distress.    briusing to chest wall with tenderness   Abdominal: Soft. Bowel sounds are normal. She exhibits no distension.  Musculoskeletal: She exhibits no edema.       Right shoulder: She exhibits decreased range of motion. She exhibits no tenderness, no swelling, no effusion and normal pulse.       Left shoulder: She  exhibits decreased range of motion, tenderness and decreased strength. She exhibits no swelling, no effusion and normal pulse.  Lymphadenopathy:    She has no cervical adenopathy.  Neurological: She is alert.  Skin: Skin is warm and dry. She is not diaphoretic.  ecchymosis thought out chest; bruising in different stages     Assessment/Plan  Fall, initial encounter and  Contusion of unspecified site At this time will get xrays; of chest and shoulder; to use aleve 1 tablet for 5 days to help with inflammation may use ice and heat topically to help with pain; also may use salonpas patch or another topical agent- may cont tylenol do not exceed 3000 mg in 24 hours  - DG Chest 1 View - DG Shoulder Left;  ------ Once xrays were done it was reported there was a Suspect a displaced fracture of the left clavicular head   Clavicle fracture, left, closed, initial encounter At this time will get a  Ambulatory referral to Orthopedic Surgery ASAP And give tramadol 50 mg q 8 hours as needed for pain   Pt needs routine follow up--- instructions given to granddaughter to have son call and make routine follow up appt as soon as possible. Will need MMSE

## 2012-11-02 ENCOUNTER — Other Ambulatory Visit: Payer: Self-pay | Admitting: Geriatric Medicine

## 2012-11-06 ENCOUNTER — Telehealth: Payer: Self-pay | Admitting: Geriatric Medicine

## 2012-11-06 NOTE — Telephone Encounter (Signed)
Patient has an appointment with you tomorrow at 2:45. Her son has decided to put her in a facility. The patient does not know that her son has made this decision. He would like for you to help break the news to his mother during the office visit that you and the family both as a whole think its best that she go into a facility. Ask [name] if you have any questions.

## 2012-11-07 ENCOUNTER — Ambulatory Visit (INDEPENDENT_AMBULATORY_CARE_PROVIDER_SITE_OTHER): Payer: Medicare Other | Admitting: Internal Medicine

## 2012-11-07 VITALS — BP 110/70 | HR 54 | Temp 97.4°F | Resp 16 | Wt 105.6 lb

## 2012-11-07 DIAGNOSIS — K219 Gastro-esophageal reflux disease without esophagitis: Secondary | ICD-10-CM

## 2012-11-07 DIAGNOSIS — F32A Depression, unspecified: Secondary | ICD-10-CM

## 2012-11-07 DIAGNOSIS — H409 Unspecified glaucoma: Secondary | ICD-10-CM

## 2012-11-07 DIAGNOSIS — Z111 Encounter for screening for respiratory tuberculosis: Secondary | ICD-10-CM

## 2012-11-07 DIAGNOSIS — I739 Peripheral vascular disease, unspecified: Secondary | ICD-10-CM

## 2012-11-07 DIAGNOSIS — R51 Headache: Secondary | ICD-10-CM

## 2012-11-07 DIAGNOSIS — N318 Other neuromuscular dysfunction of bladder: Secondary | ICD-10-CM

## 2012-11-07 DIAGNOSIS — F329 Major depressive disorder, single episode, unspecified: Secondary | ICD-10-CM | POA: Insufficient documentation

## 2012-11-07 DIAGNOSIS — N3281 Overactive bladder: Secondary | ICD-10-CM

## 2012-11-07 DIAGNOSIS — R519 Headache, unspecified: Secondary | ICD-10-CM | POA: Insufficient documentation

## 2012-11-07 DIAGNOSIS — I10 Essential (primary) hypertension: Secondary | ICD-10-CM

## 2012-11-07 HISTORY — DX: Depression, unspecified: F32.A

## 2012-11-07 HISTORY — DX: Unspecified glaucoma: H40.9

## 2012-11-07 HISTORY — DX: Peripheral vascular disease, unspecified: I73.9

## 2012-11-07 MED ORDER — OMEPRAZOLE 20 MG PO CPDR: 40.0000 mg | DELAYED_RELEASE_CAPSULE | Freq: Every day | ORAL | Status: DC

## 2012-11-07 MED ORDER — TOPIRAMATE 25 MG PO TABS: 25.0000 mg | ORAL_TABLET | Freq: Two times a day (BID) | ORAL | Status: AC

## 2012-11-07 NOTE — Progress Notes (Signed)
Patient ID: Carrie Moran, female   DOB: 06-Oct-1917, 79 y.o.   MRN: [id number]  Chief Complaint  Patient presents with  . Follow-up    Bp check,    HPI 79 y/o female patient here with her son. She has been getting her care at [facility] and will be transferring her care to our clinic for routine visits. She was seen few weeks back for OAB and HTN. Her bp medication was adjusted and she was also started on oxybuynin Her bp has been better controlled She has been going to the bathroom but frequency has decreased compared to before Her family wants her to be moved into assisted living because after summer break her daughter in law will be back to teaching in school and her son goes to work and there will be no one to take care of her at home. She had a mechanical fall recently and had fracture of head of her left clavicle  Review of Systems  Constitutional: Negative for fever, chills and diaphoresis.  Eyes: Negative for blurred vision.  Respiratory: Negative for cough and shortness of breath.   Cardiovascular: Negative for chest pain, palpitations and orthopnea.  Gastrointestinal: Negative for heartburn, nausea, vomiting, abdominal pain and diarrhea.  Genitourinary: Negative for dysuria.  Musculoskeletal: Negative for myalgias.  Skin: Negative for itching and rash.  Neurological: Negative for headaches.  Psychiatric/Behavioral: Negative for depression.    Physical exam  BP 110/70  Pulse 54  Temp(Src) 97.4 F (36.3 C) (Oral)  Resp 16  Wt 105 lb 9.6 oz (47.9 kg)  SpO2 95%  gen- elderly frail lady in NAD HEENT- no pallor, no icterus, no LAD, MMM CVS- ns 1,s2, rrr respi CTAB abdo- no suprapubic tenderness, no abdominal tenderness, bs+, soft, no flank tenderness Ext- able to move all 4, using a cane Neuro- aaox 3, no focal deficit  Assessment/plan  Talked with patient in detail about reason behind family deciding on sending her to assisted living. She wants to think about it but is  willing to get her PPD test done. Have placed ppd and to read it on 11/09/12. Will fill out FL2 form  OAB- continue oxybutynin  gerd- refill on omeprazole provided  HTN- bp better controlled. Continue amlodipine, clonidine and metoprolol. Continue asa  PVD- continue cilostazol and asa  Glaucoma- cotninue xalatan  Headache- refill on topiramate provided. Remains headache free  Depression- continue remeron as helping with her weight and appetite along with her mood  Will need records from [place] for review

## 2012-11-07 NOTE — Patient Instructions (Signed)
Come back on Friday 11/09/12 to read PPD. Your FL2 form will be ready for pick up

## 2012-11-08 ENCOUNTER — Telehealth: Payer: Self-pay | Admitting: *Deleted

## 2012-11-08 LAB — CBC WITH DIFFERENTIAL/PLATELET
Basophils Absolute: 0 10*3/uL (ref 0.0–0.2)
Eos: 3 % (ref 0–5)
Immature Grans (Abs): 0 10*3/uL (ref 0.0–0.1)
Immature Granulocytes: 0 % (ref 0–2)
Lymphocytes Absolute: 1.5 10*3/uL (ref 0.7–3.1)
MCHC: 32.6 g/dL (ref 31.5–35.7)
MCV: 93 fL (ref 79–97)
Monocytes Absolute: 0.5 10*3/uL (ref 0.1–0.9)
Monocytes: 8 % (ref 4–12)
Neutrophils Relative %: 62 % (ref 40–74)
RDW: 14 % (ref 12.3–15.4)
WBC: 5.8 10*3/uL (ref 3.4–10.8)

## 2012-11-08 LAB — COMPREHENSIVE METABOLIC PANEL
ALT: 8 IU/L (ref 0–32)
Albumin/Globulin Ratio: 1.9 (ref 1.1–2.5)
Alkaline Phosphatase: 62 IU/L (ref 39–117)
BUN/Creatinine Ratio: 29 — ABNORMAL HIGH (ref 11–26)
Chloride: 107 mmol/L (ref 97–108)
GFR calc Af Amer: 49 mL/min/{1.73_m2} — ABNORMAL LOW (ref >59)
GFR calc non Af Amer: 42 mL/min/{1.73_m2} — ABNORMAL LOW (ref >59)
Potassium: 5.3 mmol/L — ABNORMAL HIGH (ref 3.5–5.2)
Total Bilirubin: 0.4 mg/dL (ref 0.0–1.2)

## 2012-11-08 NOTE — Progress Notes (Signed)
Error

## 2012-11-12 NOTE — Telephone Encounter (Signed)
Addressed in OV

## 2012-11-30 DIAGNOSIS — N189 Chronic kidney disease, unspecified: Secondary | ICD-10-CM

## 2012-11-30 DIAGNOSIS — I129 Hypertensive chronic kidney disease with stage 1 through stage 4 chronic kidney disease, or unspecified chronic kidney disease: Secondary | ICD-10-CM

## 2012-11-30 DIAGNOSIS — F329 Major depressive disorder, single episode, unspecified: Secondary | ICD-10-CM

## 2012-11-30 DIAGNOSIS — R413 Other amnesia: Secondary | ICD-10-CM

## 2012-12-05 ENCOUNTER — Telehealth: Payer: Self-pay

## 2012-12-05 NOTE — Telephone Encounter (Signed)
Message left on triage VM, patient with elevated B/P 190/130. (FYI Message)  I called [facility] to speak with Isabell Jarvis was gone for the day.  I was told b/p was rechecked and resulted at 190/130, patient is asymptomatic.   Per Dr.Pandey recheck b/p after 20 min or resting/lying down. If b/p still elevated patient needs to be seen in ER for evaluation. Clarisse Gouge (nurse) verbalized understanding of Dr.Pandeys instructions

## 2012-12-11 ENCOUNTER — Other Ambulatory Visit: Payer: Self-pay | Admitting: *Deleted

## 2012-12-11 MED ORDER — HYDROCHLOROTHIAZIDE 12.5 MG PO TABS: ORAL_TABLET | ORAL | Status: DC

## 2012-12-14 ENCOUNTER — Telehealth: Payer: Self-pay

## 2012-12-14 NOTE — Telephone Encounter (Signed)
Message left on triage voicemail: B/P- 190/100 Pulse-66, patient is asymptomatic of TIA, denies chest pain. Patient is with frequent urination, denies burning.   Another message was also left from Carrie Moran requesting ok to refer patient for speech therapy for trouble swallowing   Please advise

## 2012-12-14 NOTE — Telephone Encounter (Signed)
Left message on voicemail for Carrie Moran informing her ok for speech therapy. Questioned if patient is following instructions from last OV

## 2012-12-14 NOTE — Telephone Encounter (Signed)
Ok to refer to speech therapy  Had made medication adjustment on reviewing your last note, is patient taking the new med that was started?

## 2012-12-19 NOTE — Telephone Encounter (Signed)
Spoke with Vernona Rieger, patient is following instructions given at last OV, patient was seen yesterday and B/P was improved- Vernona Rieger was unable to recall exact values and indicates this information is faxed to PCP daily.   Dr.Pandey please advise if any additional follow-up is needed

## 2012-12-19 NOTE — Telephone Encounter (Signed)
As per note bp appears normal. I would appreciate documented reading though. On review her bp on 9/12 14 was 200/110. If bp is < 140/90, it is good. Else notify the office

## 2012-12-25 ENCOUNTER — Telehealth: Payer: Self-pay

## 2012-12-25 NOTE — Telephone Encounter (Signed)
thanks

## 2012-12-25 NOTE — Telephone Encounter (Signed)
1.) Left message on VM for Lauren with Dr.Pandey's instructions. Medication list updated   2.) Orders signed and faxed back to Willadean Carol with clarification on medications and B/P protocol

## 2012-12-25 NOTE — Telephone Encounter (Signed)
See phone note dated 12/25/2012

## 2012-12-25 NOTE — Telephone Encounter (Signed)
1.) Triage message left indication patient's B/P is 180/110. Current weight 102.6 was previously 103. Patient also still complaining of urinary frequency. Patient is requesting order for ensure. Call was from Lauren @ [facility] [phone]   2.) Another triage message was left on this patient by Willadean Carol from [facility] [phone], Paper was sent requesting clarification on medications and a standing order on how to handle patient's elevated B/P readings (when to call PCP). Please review paperwork that was sent and advise ASAP.   Message to be sent and printed for PCP (Dr.Pandey) to review and advise

## 2012-12-25 NOTE — Telephone Encounter (Signed)
Carrie Moran calling from [place] with another b/p reading 215/120 (FYI)

## 2012-12-25 NOTE — Telephone Encounter (Signed)
Patient last seen in office on 8/14 and her blood pressure was stable that visit. It appears she is having several elevated bp readings. She was transferring her care from the [facility] hospital but records are still pending. Increase her HCTZ to 25 mg daily for now. Have her come to the office for follow up for elevated bp next week with a provider. Her urinary frequency will be addressed in that visit as well. It will be good to get her records from [place] prior to the visit

## 2012-12-26 ENCOUNTER — Other Ambulatory Visit: Payer: Self-pay | Admitting: *Deleted

## 2012-12-26 DIAGNOSIS — S42002A Fracture of unspecified part of left clavicle, initial encounter for closed fracture: Secondary | ICD-10-CM

## 2012-12-26 MED ORDER — TRAMADOL HCL 50 MG PO TABS: 50.0000 mg | ORAL_TABLET | Freq: Three times a day (TID) | ORAL | Status: DC | PRN

## 2012-12-27 ENCOUNTER — Ambulatory Visit: Payer: Self-pay | Admitting: Internal Medicine

## 2012-12-31 ENCOUNTER — Encounter: Payer: Self-pay | Admitting: Internal Medicine

## 2012-12-31 ENCOUNTER — Ambulatory Visit (INDEPENDENT_AMBULATORY_CARE_PROVIDER_SITE_OTHER): Payer: Medicare Other | Admitting: Internal Medicine

## 2012-12-31 VITALS — BP 190/100 | HR 57 | Temp 97.7°F | Wt 101.0 lb

## 2012-12-31 DIAGNOSIS — N318 Other neuromuscular dysfunction of bladder: Secondary | ICD-10-CM

## 2012-12-31 DIAGNOSIS — N3281 Overactive bladder: Secondary | ICD-10-CM

## 2012-12-31 DIAGNOSIS — I10 Essential (primary) hypertension: Secondary | ICD-10-CM

## 2012-12-31 MED ORDER — AMLODIPINE BESYLATE 5 MG PO TABS: 5.0000 mg | ORAL_TABLET | Freq: Every day | ORAL | Status: DC

## 2012-12-31 NOTE — Progress Notes (Signed)
Patient ID: Carrie Moran, female   DOB: 24-Nov-1917, 79 y.o.   MRN: [id number] Location:  [facility]  No Known Allergies  Chief Complaint  Patient presents with  . Hypertension    elevated B/P   . Urinary Frequency    ongoing concern- per son x several years    HPI: Patient is a 79 y.o. white female seen in the office today for elevated BP.   Son is with her and states that it has been high since her 79s. States that in the past every time she has been to the hospital it has been very high even though she is being managed. Over the past week BP medication has been increased but no changes in BP. Pt is asymptomatic when BP is high. Denies having a headache or changes in her vision. Pt. Has been at [facility] for three weeks before she lived her son. He states that he checked her BP every once in a while when she lived with him and most of the time it was high.  Son is considered that she has white coat syndrome and that elevates her BP. Considered that this new move may be contributing.   Review of Systems:  Review of Systems  Eyes: Negative for blurred vision and double vision.  Respiratory: Positive for shortness of breath.        With acitivity  Cardiovascular: Negative for chest pain.  Neurological: Negative for dizziness and headaches.       States not anymore than usual     Past Medical History  Diagnosis Date  . Hypertension   . Memory loss   . GERD (gastroesophageal reflux disease)   . RLS (restless legs syndrome)   . Hypertonicity of bladder   . Renal insufficiency   . Other headache syndromes(339.89)     History reviewed. No pertinent past surgical history.  Social History:   reports that she has quit smoking. She does not have any smokeless tobacco history on file. She reports that she does not drink alcohol or use illicit drugs.  No family history on file.  Medications: Patient's Medications  New Prescriptions    No medications on file  Previous Medications   ASPIRIN 81 MG TABLET    Take 81 mg by mouth daily.   CILOSTAZOL (PLETAL) 50 MG TABLET    Take 50 mg by mouth 2 (two) times daily.   CLONIDINE (CATAPRES) 0.1 MG TABLET    Take 1 tablet (0.1 mg total) by mouth 2 (two) times daily.   HYDROCHLOROTHIAZIDE (HYDRODIURIL) 25 MG TABLET    Take 25 mg by mouth daily.   LATANOPROST (XALATAN) 0.005 % OPHTHALMIC SOLUTION    Place 1 drop into both eyes daily. Instill 1 drop in each eye once daily for glaucoma.   METOPROLOL TARTRATE (LOPRESSOR) 25 MG TABLET    Take 50 mg by mouth 2 (two) times daily.    MIRTAZAPINE (REMERON) 15 MG TABLET    Take 15 mg by mouth at bedtime.   OMEPRAZOLE (PRILOSEC) 20 MG CAPSULE    Take 2 capsules (40 mg total) by mouth daily.   OXYBUTYNIN (DITROPAN) 5 MG TABLET    Take 1 tablet (5 mg total) by mouth daily.   TIMOLOL MALEATE 0.5 % (DAILY) SOLN    Place 1 drop into both eyes daily.    TOPIRAMATE (TOPAMAX) 25 MG TABLET    Take 1 tablet (25 mg total) by mouth 2 (two)  times daily.   TRAMADOL (ULTRAM) 50 MG TABLET    Take 1 tablet (50 mg total) by mouth every 8 (eight) hours as needed for pain.  Modified Medications   No medications on file  Discontinued Medications   No medications on file     Physical Exam: Filed Vitals:   12/31/12 1158  BP: 190/100  Pulse: 57  Temp: 97.7 F (36.5 C)  TempSrc: Oral  Weight: 101 lb (45.813 kg)  SpO2: 95%   Physical Exam  Constitutional: She appears well-developed and well-nourished.  Thin white female  Cardiovascular: Normal rate, regular rhythm, normal heart sounds and intact distal pulses.   Pulmonary/Chest: Effort normal and breath sounds normal.  Neurological: She is alert.  Skin: Skin is warm and dry.  Psychiatric: She has a normal mood and affect.     Labs reviewed: Basic Metabolic Panel:  Recent Labs  72/53/66 1545  NA 145*  K 5.3*  CL 107  CO2 26  GLUCOSE 100*  BUN 33  CREATININE 1.12*  CALCIUM 9.9   Liver  Function Tests:  Recent Labs  11/07/12 1545  AST 14  ALT 8  ALKPHOS 62  BILITOT 0.4  PROT 5.8*  CBC:  Recent Labs  11/07/12 1545  WBC 5.8  NEUTROABS 3.6  HGB 11.8  HCT 36.2  MCV 93    Assessment/Plan 1. Essential hypertension, benign -continue catapres 0.1mg  po bid, hctz 25mg  and lopressor 50mg  po bid - amLODipine (NORVASC) 5 MG tablet; Take 1 tablet (5 mg total) by mouth daily.  Dispense: 90 tablet; Refill: 3 - could consider renal artery doppler study if bp remains uncontrolled with norvasc, but appears pt and her son would not be interested in stenting or intervention if this was discovered (makes sense given her age and comorbidities)  2. Overactive bladder -remains problematic -ditropan is not helping her -unfortunately, bp is poorly controlled, and that is a side effect of myrbetriq so I would probably not try it  Labs/tests ordered:  None today Next appt:  As scheduled with Dr. Glade Lloyd

## 2012-12-31 NOTE — Patient Instructions (Signed)
Start amlodipine 5mg  by mouth daily Follow a low sodium diet If your blood pressure is remaining above 150/90, please contact the office and we can increase the amlodipine to 10mg  daily.

## 2013-01-01 ENCOUNTER — Other Ambulatory Visit: Payer: Self-pay | Admitting: *Deleted

## 2013-01-04 ENCOUNTER — Telehealth: Payer: Self-pay | Admitting: *Deleted

## 2013-01-08 NOTE — Telephone Encounter (Signed)
Note sent to MD

## 2013-02-12 ENCOUNTER — Encounter: Payer: Self-pay | Admitting: *Deleted

## 2013-02-12 ENCOUNTER — Ambulatory Visit: Payer: Medicare Other | Admitting: Internal Medicine

## 2013-03-05 ENCOUNTER — Telehealth: Payer: Self-pay | Admitting: *Deleted

## 2013-03-05 NOTE — Telephone Encounter (Signed)
Received a fax from [facility] regarding patient's BP results 03/02/2013 her BP was 154/92 and pulse 70 after medication per [employer].  Per Dr. Warner Mccreedy amlodipine to 10mg  by mouth daily. Monitor BP. Also next BP reading, please send her medication list for verification. Faxed to [facility] Fax#: [phone]

## 2013-06-20 ENCOUNTER — Ambulatory Visit (INDEPENDENT_AMBULATORY_CARE_PROVIDER_SITE_OTHER): Payer: Medicare Other | Admitting: Nurse Practitioner

## 2013-06-20 ENCOUNTER — Encounter: Payer: Self-pay | Admitting: Nurse Practitioner

## 2013-06-20 VITALS — BP 128/82 | HR 53 | Temp 96.5°F | Resp 10 | Wt 108.0 lb

## 2013-06-20 DIAGNOSIS — I739 Peripheral vascular disease, unspecified: Secondary | ICD-10-CM

## 2013-06-20 DIAGNOSIS — I10 Essential (primary) hypertension: Secondary | ICD-10-CM

## 2013-06-20 DIAGNOSIS — N318 Other neuromuscular dysfunction of bladder: Secondary | ICD-10-CM

## 2013-06-20 DIAGNOSIS — R634 Abnormal weight loss: Secondary | ICD-10-CM

## 2013-06-20 DIAGNOSIS — R51 Headache: Secondary | ICD-10-CM

## 2013-06-20 DIAGNOSIS — F329 Major depressive disorder, single episode, unspecified: Secondary | ICD-10-CM

## 2013-06-20 DIAGNOSIS — N3281 Overactive bladder: Secondary | ICD-10-CM

## 2013-06-20 DIAGNOSIS — K219 Gastro-esophageal reflux disease without esophagitis: Secondary | ICD-10-CM

## 2013-06-20 DIAGNOSIS — F32A Depression, unspecified: Secondary | ICD-10-CM

## 2013-06-20 DIAGNOSIS — F3289 Other specified depressive episodes: Secondary | ICD-10-CM

## 2013-06-20 NOTE — Patient Instructions (Signed)
  NEW ORDERS  -STOP ditropan  -please take blood pressure three times a week only after Harlin Rain has been sitting for 5-10 mins  -Cont all blood pressure medications as previously prescribed  -blood work today

## 2013-06-20 NOTE — Progress Notes (Signed)
Patient ID: Carrie Moran, female   DOB: 1917/06/14, 80 y.o.   MRN: [id number]    No Known Allergies  Chief Complaint  Patient presents with  . Hypertension    Elevated blood pressure   . Leg Swelling    Bilateral leg swelling since October, swelling decreases at night   . Foot Swelling    Left foot swelling     HPI: Patient is a 80 y.o. famle seen in the office today for routine follow up; pt has been having elevated blood pressure at facility but there are no records of what the blood pressure is, per son and pt her blood pressure does run high at times but overall it is better controlled.  Pt has had slight leg swelling since she was started on norvasc; blood pressure better controlled and swelling does not bother her other than the fact she knows it is there. -son reports pt is [facility] happier now that she is at assisted living, eating better (weight is up), involved in activities, more social   Review of Systems:  Review of Systems  Constitutional: Negative for fever, chills, malaise/fatigue and diaphoresis.  Eyes: Negative for blurred vision.  Respiratory: Negative for cough and shortness of breath.   Cardiovascular: Positive for leg swelling. Negative for chest pain, palpitations and orthopnea.  Gastrointestinal: Negative for heartburn, nausea, vomiting, abdominal pain, diarrhea and constipation.  Genitourinary: Positive for frequency (ongoing despite medication ). Negative for dysuria.  Musculoskeletal: Negative for myalgias.  Skin: Negative for itching and rash.  Neurological: Negative for dizziness, tingling and headaches.  Psychiatric/Behavioral: Negative for depression. The patient is not nervous/anxious and does not have insomnia.      Past Medical History  Diagnosis Date  . Hypertension   . Memory loss   . GERD (gastroesophageal reflux disease)   . RLS (restless legs syndrome)   . Hypertonicity of bladder   . Renal insufficiency   . Other headache  syndromes(339.89)   . Osteoporosis, unspecified   . Urinary frequency    Past Surgical History  Procedure Laterality Date  . Appendectomy  1930  . Rotator cuff repair  1986  . Rotator cuff repair  2001  . Carpal tunnel release  2006    Bilateral    Social History:   reports that she has quit smoking. She does not have any smokeless tobacco history on file. She reports that she does not drink alcohol or use illicit drugs.  Family History  Problem Relation Age of Onset  . Heart attack Mother   . Cancer Brother   . Cancer Brother     Medications: Patient's Medications  New Prescriptions   No medications on file  Previous Medications   AMLODIPINE (NORVASC) 10 MG TABLET    Take one tablet once daily to control Blood pressure   ASPIRIN 81 MG TABLET    Take 81 mg by mouth daily.   CILOSTAZOL (PLETAL) 50 MG TABLET    Take 50 mg by mouth 2 (two) times daily.   CLONIDINE (CATAPRES) 0.1 MG TABLET    Take 1 tablet (0.1 mg total) by mouth 2 (two) times daily.   HYDROCHLOROTHIAZIDE (HYDRODIURIL) 25 MG TABLET    Take 25 mg by mouth daily.   LATANOPROST (XALATAN) 0.005 % OPHTHALMIC SOLUTION    Place 1 drop into both eyes daily. Instill 1 drop in each eye once daily for glaucoma.   METOPROLOL TARTRATE (LOPRESSOR) 25 MG TABLET    Take 50 mg by mouth 2 (  two) times daily.    MIRTAZAPINE (REMERON) 15 MG TABLET    Take 15 mg by mouth at bedtime.   OMEPRAZOLE (PRILOSEC) 20 MG CAPSULE    Take 2 capsules (40 mg total) by mouth daily.   OXYBUTYNIN (DITROPAN) 5 MG TABLET    Take 1 tablet (5 mg total) by mouth daily.   TIMOLOL MALEATE 0.5 % (DAILY) SOLN    Place 1 drop into both eyes daily.    TOPIRAMATE (TOPAMAX) 25 MG TABLET    Take 1 tablet (25 mg total) by mouth 2 (two) times daily.   TRAMADOL (ULTRAM) 50 MG TABLET    Take 1 tablet (50 mg total) by mouth every 8 (eight) hours as needed for pain.  Modified Medications   No medications on file  Discontinued Medications   No medications on file      Physical Exam:  Filed Vitals:   06/20/13 1519  BP: 128/82  Pulse: 53  Temp: 96.5 F (35.8 C)  Resp: 10  Weight: 108 lb (48.988 kg)  SpO2: 95%    Physical Exam  Constitutional: She is well-developed, well-nourished, and in no distress.  HENT:  Mouth/Throat: Oropharynx is clear and moist. No oropharyngeal exudate.  Eyes: Conjunctivae and EOM are normal. Pupils are equal, round, and reactive to light.  Neck: Neck supple. No JVD present. No thyromegaly present.  Cardiovascular: Normal rate, regular rhythm, normal heart sounds and intact distal pulses.   Pulmonary/Chest: Effort normal and breath sounds normal. No respiratory distress. She has no wheezes.  Abdominal: Soft. Bowel sounds are normal. She exhibits no distension. There is no tenderness.  Musculoskeletal: She exhibits edema (+1 bilaterally). She exhibits no tenderness.  Lymphadenopathy:    She has no cervical adenopathy.  Neurological: She is alert.  Skin: Skin is warm and dry.  Psychiatric: Affect normal.     Labs reviewed: Basic Metabolic Panel:  Recent Labs  11/07/12 1545  NA 145*  K 5.3*  CL 107  CO2 26  GLUCOSE 100*  BUN 33  CREATININE 1.12*  CALCIUM 9.9   Liver Function Tests:  Recent Labs  11/07/12 1545  AST 14  ALT 8  ALKPHOS 62  BILITOT 0.4  PROT 5.8*   No results found for this basename: LIPASE, AMYLASE,  in the last 8760 hours No results found for this basename: AMMONIA,  in the last 8760 hours CBC:  Recent Labs  11/07/12 1545  WBC 5.8  NEUTROABS 3.6  HGB 11.8  HCT 36.2  MCV 93    Assessment/Plan 1. HTN (hypertension) -pt blood pressure seems to be labile but overall seems to have improved since starting norvasc several months ago; of note the edema is most likely from this but swelling is minimal and does improve at night per pt; offer TED hose to help during the day but swelling does not appear to be bothering her enough even to wear these.  -blood pressure good  today, educated pts son that facility should take bp while pt has been sitting for ~5 mins and not agitated; will have them check blood pressure 3 times weekly  -to cont catapres, norvasc and lopressor - Basic metabolic panel - CBC With differential/Platelet  2. Overactive bladder -no improvement on medication -STOP ditropan at this time  3. GERD (gastroesophageal reflux disease) -conts on prilosec with good results  4. Loss of weight -has improved; conts on Remeron with good effect  5. Depression -improved since transitioned to assisted living   6. Headache(784.0) -conts  on topamax without increase in headaches  7. PVD (peripheral vascular disease) -stable on petal  - CBC With differential/Platelet

## 2013-06-21 LAB — CBC WITH DIFFERENTIAL
BASOS ABS: 0 10*3/uL (ref 0.0–0.2)
Basos: 0 %
Eos: 3 %
Eosinophils Absolute: 0.2 10*3/uL (ref 0.0–0.4)
HEMATOCRIT: 40.2 % (ref 34.0–46.6)
Hemoglobin: 13.4 g/dL (ref 11.1–15.9)
Immature Grans (Abs): 0 10*3/uL (ref 0.0–0.1)
Immature Granulocytes: 0 %
LYMPHS ABS: 1.8 10*3/uL (ref 0.7–3.1)
LYMPHS: 33 %
MCH: 30.2 pg (ref 26.6–33.0)
MCHC: 33.3 g/dL (ref 31.5–35.7)
MCV: 91 fL (ref 79–97)
Monocytes Absolute: 0.5 10*3/uL (ref 0.1–0.9)
Monocytes: 9 %
Neutrophils Absolute: 2.9 10*3/uL (ref 1.4–7.0)
Neutrophils Relative %: 55 %
Platelets: 179 10*3/uL (ref 150–379)
RBC: 4.44 x10E6/uL (ref 3.77–5.28)
RDW: 13 % (ref 12.3–15.4)
WBC: 5.4 10*3/uL (ref 3.4–10.8)

## 2013-06-21 LAB — BASIC METABOLIC PANEL
BUN/Creatinine Ratio: 23 (ref 11–26)
BUN: 37 mg/dL — ABNORMAL HIGH (ref 10–36)
CALCIUM: 9.7 mg/dL (ref 8.7–10.3)
CHLORIDE: 101 mmol/L (ref 97–108)
CO2: 25 mmol/L (ref 18–29)
Creatinine, Ser: 1.58 mg/dL — ABNORMAL HIGH (ref 0.57–1.00)
GFR calc Af Amer: 32 mL/min/{1.73_m2} — ABNORMAL LOW (ref >59)
GFR, EST NON AFRICAN AMERICAN: 28 mL/min/{1.73_m2} — AB (ref >59)
GLUCOSE: 89 mg/dL (ref 65–99)
POTASSIUM: 3.5 mmol/L (ref 3.5–5.2)
Sodium: 144 mmol/L (ref 134–144)

## 2013-06-28 ENCOUNTER — Encounter: Payer: Self-pay | Admitting: Internal Medicine

## 2013-07-09 ENCOUNTER — Ambulatory Visit (INDEPENDENT_AMBULATORY_CARE_PROVIDER_SITE_OTHER): Payer: Medicare Other | Admitting: Internal Medicine

## 2013-07-09 ENCOUNTER — Encounter: Payer: Self-pay | Admitting: Internal Medicine

## 2013-07-09 ENCOUNTER — Ambulatory Visit: Payer: Self-pay | Admitting: Internal Medicine

## 2013-07-09 VITALS — BP 112/70 | HR 72 | Temp 98.0°F | Wt 106.8 lb

## 2013-07-09 DIAGNOSIS — I10 Essential (primary) hypertension: Secondary | ICD-10-CM

## 2013-07-09 DIAGNOSIS — H6120 Impacted cerumen, unspecified ear: Secondary | ICD-10-CM

## 2013-07-09 DIAGNOSIS — N183 Chronic kidney disease, stage 3 unspecified: Secondary | ICD-10-CM

## 2013-07-09 DIAGNOSIS — H6121 Impacted cerumen, right ear: Secondary | ICD-10-CM

## 2013-07-09 DIAGNOSIS — R42 Dizziness and giddiness: Secondary | ICD-10-CM

## 2013-07-09 HISTORY — DX: Essential (primary) hypertension: I10

## 2013-07-09 MED ORDER — METOPROLOL TARTRATE 25 MG PO TABS: 25.0000 mg | ORAL_TABLET | Freq: Two times a day (BID) | ORAL | Status: DC

## 2013-07-09 MED ORDER — CARBAMIDE PEROXIDE 6.5 % OT SOLN
5.0000 [drp] | Freq: Two times a day (BID) | OTIC | Status: AC
Start: 2013-07-09 — End: 2013-07-14

## 2013-07-09 NOTE — Progress Notes (Signed)
Patient ID: Carrie Moran, female   DOB: 12-15-1917, 80 y.o.   MRN: [id number]    Chief Complaint  Patient presents with  . Acute Visit    fall, dizziness, multiple elevated bp readings in facility   No Known Allergies  HPI 80 y/o female patient is seen today for :  Elevated bp readings- she has hx of HTN and has had several bp readings with SBP 160-190 and DBP 90-110. On further review from facility,  07/03/13 bp 135/93, 07/05/13 128/70, 07/08/13 109/59  This am bp 189/99 and after medication 103/60  She had a mechanical fall yesterday while trying to wear her trousers, she denies hitting her head. She is alert and oriented at present. She denies any headache, blurry vision, abdominal pain, chest pain or dyspnea. She has dizziness at times with change of position. Denies ringing in her ears  Review of Systems  Constitutional: Negative for fever, chills, weight loss. Feels tired HENT: Negative for congestion, hearing loss and sore throat.   Eyes: Negative for blurred vision, double vision and discharge.  Respiratory: has dry cough for couple of days. Negative for sputum production, shortness of breath and wheezing.   Cardiovascular: Negative for chest pain, palpitations, orthopnea and leg swelling.  Gastrointestinal: Negative for heartburn, nausea, vomiting, abdominal pain Genitourinary: Negative for dysuria, urgency, frequency and flank pain.  Musculoskeletal: Negative for back pain. Has  Knee pain Skin: Negative for itching and rash.  Neurological: Negative for tingling, focal weakness and headaches.  Psychiatric/Behavioral: Negative for depression and memory loss. The patient is not nervous/anxious.    Past Medical History  Diagnosis Date  . Hypertension   . Memory loss   . GERD (gastroesophageal reflux disease)   . RLS (restless legs syndrome)   . Hypertonicity of bladder   . Renal insufficiency   . Other headache syndromes(339.89)   . Osteoporosis, unspecified   . Urinary  frequency    Past Surgical History  Procedure Laterality Date  . Appendectomy  1930  . Rotator cuff repair  1986  . Rotator cuff repair  2001  . Carpal tunnel release  2006    Bilateral    Current Outpatient Prescriptions on File Prior to Visit  Medication Sig Dispense Refill  . amLODipine (NORVASC) 10 MG tablet Take one tablet once daily to control Blood pressure      . aspirin 81 MG tablet Take 81 mg by mouth daily.      . cilostazol (PLETAL) 50 MG tablet Take 50 mg by mouth 2 (two) times daily.      . cloNIDine (CATAPRES) 0.1 MG tablet Take 1 tablet (0.1 mg total) by mouth 2 (two) times daily.  60 tablet  3  . latanoprost (XALATAN) 0.005 % ophthalmic solution Place 1 drop into both eyes daily. Instill 1 drop in each eye once daily for glaucoma.      . metoprolol tartrate (LOPRESSOR) 25 MG tablet Take 50 mg by mouth 2 (two) times daily.       . mirtazapine (REMERON) 15 MG tablet Take 15 mg by mouth at bedtime.      Marland Kitchen omeprazole (PRILOSEC) 20 MG capsule Take 2 capsules (40 mg total) by mouth daily.  60 capsule  3  . Timolol Maleate 0.5 % (DAILY) SOLN Place 1 drop into both eyes daily.       Marland Kitchen topiramate (TOPAMAX) 25 MG tablet Take 1 tablet (25 mg total) by mouth 2 (two) times daily.  60 tablet  1  .  traMADol (ULTRAM) 50 MG tablet Take 1 tablet (50 mg total) by mouth every 8 (eight) hours as needed for pain.  90 tablet  5   No current facility-administered medications on file prior to visit.    Physical exam BP 110/68  Pulse 65  Temp(Src) 98 F (36.7 C) (Oral)  Wt 106 lb 12.8 oz (48.444 kg)  SpO2 97%  Orthostatic bp Lying down 102/60, 66/min Sitting- 110/68, 65/min Standing 112/70, 72/min  General- elderly female in no acute distress, frail Head- atraumatic, normocephalic Eyes- PERRLA, EOMI, no pallor, no icterus Ears- left ear normal tympanic membrane and normal external ear canal , right ear imapcted with cerumen Neck- no lymphadenopathy Mouth- normal mucus membrane, no  oral thrush, normal oropharynx Chest- no chest wall deformities, no chest wall tenderness Cardiovascular- normal s1,s2, no murmurs/ rubs/ gallops Respiratory- bilateral clear to auscultation, no wheeze, no rhonchi, no crackles Abdomen- bowel sounds present, soft, non tender Musculoskeletal- able to move all 4 extremities, no spinal and paraspinal tenderness,no use of assistive device, no leg edema Neurological- no focal deficit Skin- warm and dry Psychiatry- alert and oriented, normal mood and affect  Labs- Lab Results  Component Value Date   WBC 5.4 06/20/2013   HGB 13.4 06/20/2013   HCT 40.2 06/20/2013   MCV 91 06/20/2013   PLT 179 06/20/2013   CMP     Component Value Date/Time   NA 144 06/20/2013 1610   NA 141 02/02/2010 0433   K 3.5 06/20/2013 1610   CL 101 06/20/2013 1610   CO2 25 06/20/2013 1610   GLUCOSE 89 06/20/2013 1610   GLUCOSE 101* 02/02/2010 0433   BUN 37* 06/20/2013 1610   BUN 18 02/02/2010 0433   CREATININE 1.58* 06/20/2013 1610   CALCIUM 9.7 06/20/2013 1610   PROT 5.8* 11/07/2012 1545   PROT 5.6* 05/03/2009 0550   ALBUMIN 3.2* 05/03/2009 0550   AST 14 11/07/2012 1545   ALT 8 11/07/2012 1545   ALKPHOS 62 11/07/2012 1545   BILITOT 0.4 11/07/2012 1545   GFRNONAA 28* 06/20/2013 1610   GFRAA 32* 06/20/2013 1610    Assessment/plan  1. Accelerated hypertension bp reading in office and last 1 week looks overall normal with few elevated readings. Given her age, will avoid too tght a control. Stop hctz with her worsening renal function. Change lopressor to 25 mg bid. Monitor bp twice a day, review reading in 1 week. Follow up in 1 month  2. Dizziness orthostasis ruled out. No anemia in past, will rule out by checking cbc given new onset. Likely iatrogenic with her being on multiple bp medication. Adjustment made, see above - CBC with Differential  3. CKD (chronic kidney disease) stage 3, GFR 30-59 ml/min Avoid NSAIDs, encouraged hydration. Likely from long standing bp - Basic  Metabolic Panel  4. Impacted cerumen In right ear, debrox ear drop 1 drop bid in right ear x 5 days f/b ear lavage

## 2013-07-10 LAB — CBC WITH DIFFERENTIAL/PLATELET
Basophils Absolute: 0 10*3/uL (ref 0.0–0.2)
Basos: 1 %
EOS ABS: 0.1 10*3/uL (ref 0.0–0.4)
Eos: 4 %
HCT: 39.8 % (ref 34.0–46.6)
Hemoglobin: 13.3 g/dL (ref 11.1–15.9)
IMMATURE GRANS (ABS): 0 10*3/uL (ref 0.0–0.1)
IMMATURE GRANULOCYTES: 0 %
LYMPHS ABS: 1.4 10*3/uL (ref 0.7–3.1)
Lymphs: 34 %
MCH: 30.7 pg (ref 26.6–33.0)
MCHC: 33.4 g/dL (ref 31.5–35.7)
MCV: 92 fL (ref 79–97)
Monocytes Absolute: 0.9 10*3/uL (ref 0.1–0.9)
Monocytes: 22 %
NEUTROS PCT: 39 %
Neutrophils Absolute: 1.6 10*3/uL (ref 1.4–7.0)
RBC: 4.33 x10E6/uL (ref 3.77–5.28)
RDW: 13.2 % (ref 12.3–15.4)
WBC: 4 10*3/uL (ref 3.4–10.8)

## 2013-07-10 LAB — BASIC METABOLIC PANEL
BUN / CREAT RATIO: 18 (ref 11–26)
BUN: 29 mg/dL (ref 10–36)
CHLORIDE: 98 mmol/L (ref 97–108)
CO2: 27 mmol/L (ref 18–29)
Calcium: 9.5 mg/dL (ref 8.7–10.3)
Creatinine, Ser: 1.61 mg/dL — ABNORMAL HIGH (ref 0.57–1.00)
GFR calc non Af Amer: 27 mL/min/{1.73_m2} — ABNORMAL LOW (ref >59)
GFR, EST AFRICAN AMERICAN: 32 mL/min/{1.73_m2} — AB (ref >59)
GLUCOSE: 88 mg/dL (ref 65–99)
Potassium: 3.6 mmol/L (ref 3.5–5.2)
SODIUM: 142 mmol/L (ref 134–144)

## 2013-08-06 ENCOUNTER — Ambulatory Visit: Payer: Medicare Other | Admitting: Internal Medicine

## 2013-08-07 ENCOUNTER — Encounter: Payer: Self-pay | Admitting: Internal Medicine

## 2013-08-07 ENCOUNTER — Ambulatory Visit (INDEPENDENT_AMBULATORY_CARE_PROVIDER_SITE_OTHER): Payer: Medicare Other | Admitting: Internal Medicine

## 2013-08-07 VITALS — BP 154/82 | HR 80 | Temp 97.7°F | Ht 63.0 in | Wt 105.8 lb

## 2013-08-07 DIAGNOSIS — H6120 Impacted cerumen, unspecified ear: Secondary | ICD-10-CM

## 2013-08-07 DIAGNOSIS — N318 Other neuromuscular dysfunction of bladder: Secondary | ICD-10-CM

## 2013-08-07 DIAGNOSIS — N3281 Overactive bladder: Secondary | ICD-10-CM | POA: Insufficient documentation

## 2013-08-07 DIAGNOSIS — H6123 Impacted cerumen, bilateral: Secondary | ICD-10-CM

## 2013-08-07 DIAGNOSIS — I10 Essential (primary) hypertension: Secondary | ICD-10-CM

## 2013-08-07 MED ORDER — FESOTERODINE FUMARATE ER 4 MG PO TB24: 4.0000 mg | ORAL_TABLET | Freq: Every day | ORAL | Status: DC

## 2013-08-07 NOTE — Progress Notes (Signed)
Patient ID: Carrie Moran, female   DOB: [birth year], 80 y.o.   MRN: [id number]    Chief Complaint  Patient presents with  . Medical Management of Chronic Issues    1 month f/u  . other    frequent urination and needs ears cleaned out  . Immunizations    declines Tdap   Allergies  Allergen Reactions  . Sulfa Antibiotics    HPI 80 y/o female patient is here for follow up visit. Her son is present with her. She complaints of having hearing loss and feeling stuffed in both ears. She also has increased urinary frequency both during day and night- gets up atleast 3 times in the night. No burning or pain reported. No vaginal discharge or itching. Her bp reading from facility reviewed with few elevated SBP reading but overall normal. dneies headache or chest pain or dyspnea  Review of Systems  Constitutional: Negative for fever, chills, weight loss, malaise/fatigue and diaphoresis.  HENT: Negative for congestion, hearing loss and sore throat.   Eyes: Negative for blurred vision, double vision and discharge.  Respiratory: Negative for cough, sputum production, shortness of breath and wheezing.   Cardiovascular: Negative for chest pain, palpitations, orthopnea and leg swelling.  Gastrointestinal: Negative for heartburn, nausea, vomiting Genitourinary: Negative for dysuria and flank pain.  Musculoskeletal: Negative for back pain, falls, joint pain and myalgias.  Skin: Negative for itching and rash.  Neurological:  Negative for dizziness, tingling, focal weakness and headaches.  Psychiatric/Behavioral: Negative for depression and memory loss. The patient is not nervous/anxious.    Past Medical History  Diagnosis Date  . Hypertension   . Memory loss   . GERD (gastroesophageal reflux disease)   . RLS (restless legs syndrome)   . Hypertonicity of bladder   . Renal insufficiency   . Other headache syndromes(339.89)   . Osteoporosis, unspecified   . Urinary frequency    Past Surgical  History  Procedure Laterality Date  . Appendectomy  1930  . Rotator cuff repair  1986  . Rotator cuff repair  2001  . Carpal tunnel release  2006    Bilateral    Current Outpatient Prescriptions on File Prior to Visit  Medication Sig Dispense Refill  . amLODipine (NORVASC) 10 MG tablet Take one tablet once daily to control Blood pressure      . aspirin 81 MG tablet Take 81 mg by mouth daily.      . cilostazol (PLETAL) 50 MG tablet Take 50 mg by mouth 2 (two) times daily.      . cloNIDine (CATAPRES) 0.1 MG tablet Take 1 tablet (0.1 mg total) by mouth 2 (two) times daily.  60 tablet  3  . latanoprost (XALATAN) 0.005 % ophthalmic solution Place 1 drop into both eyes daily. Instill 1 drop in each eye once daily for glaucoma.      . metoprolol tartrate (LOPRESSOR) 25 MG tablet Take 1 tablet (25 mg total) by mouth 2 (two) times daily.  60 tablet  3  . mirtazapine (REMERON) 15 MG tablet Take 15 mg by mouth at bedtime.      Marland Kitchen omeprazole (PRILOSEC) 20 MG capsule Take 2 capsules (40 mg total) by mouth daily.  60 capsule  3  . Timolol Maleate 0.5 % (DAILY) SOLN Place 1 drop into both eyes daily.       Marland Kitchen topiramate (TOPAMAX) 25 MG tablet Take 1 tablet (25 mg total) by mouth 2 (two) times daily.  60 tablet  1  .  traMADol (ULTRAM) 50 MG tablet Take 1 tablet (50 mg total) by mouth every 8 (eight) hours as needed for pain.  90 tablet  5   No current facility-administered medications on file prior to visit.    Physical exam BP 154/82  Pulse 80  Temp(Src) 97.7 F (36.5 C) (Oral)  Ht 5\' 3"  (1.6 m)  Wt 105 lb 12.8 oz (47.991 kg)  BMI 18.75 kg/m2  SpO2 98%  General- elderly female in no acute distress, frail Head- atraumatic, normocephalic Eyes- PERRLA, EOMI, no pallor, no icterus Ears- left ear right ear imapcted with cerumen Neck- no lymphadenopathy Mouth- normal mucus membrane, no oral thrush, normal oropharynx Chest- no chest wall deformities, no chest wall tenderness Cardiovascular- normal  s1,s2, no murmurs/ rubs/ gallops Respiratory- bilateral clear to auscultation, no wheeze, no rhonchi, no crackles Abdomen- bowel sounds present, soft, non tender Musculoskeletal- able to move all 4 extremities, no spinal and paraspinal tenderness,no use of assistive device, no leg edema Neurological- no focal deficit Skin- warm and dry Psychiatry- alert and oriented, normal mood and affect   Assessment/plan 1. OAB (overactive bladder) Will have her on toviaz 4 mg daily for now and monitor clinically. Advised to avoid drinking water after 8pm with her going to bed at 9-9:30  2. Impacted cerumen of both ears Ear lavage and both canal appear clear  3. Essential hypertension, benign Overall fluctuating bp reading with few high and other normal reading. Wont change medications but will have them given at defined timing and assess further. Metoprolol 25 mg bid 7am/pm and clonidine 0.1 mg 7am/pm and amlodipine 10 mg daily for now. Monitor bp at home

## 2013-08-13 ENCOUNTER — Telehealth: Payer: Self-pay

## 2013-08-13 NOTE — Telephone Encounter (Signed)
Paperwork was dropped off by [employer] and given to Dr.Pandey. Paperwork was a physician's order to sign medication list. Per Dr.Pandey, med list dropped off was not accurate when compared to med list in Epic. I [phone]) and faxed [phone]) paperwork to [facility] place to update medication list (copy of our list and dropped off list faxed).  I called Misty @ [phone] informing her of Dr.Pandey's response to med list/ physician orders, I also informed Misty of the action plan to correct med list. Dr.Pandey will sign med list/physician orders once corrected.

## 2013-08-15 NOTE — Progress Notes (Signed)
This encounter was created in error - please disregard.

## 2013-08-21 ENCOUNTER — Other Ambulatory Visit: Payer: Self-pay | Admitting: *Deleted

## 2013-09-24 ENCOUNTER — Ambulatory Visit (INDEPENDENT_AMBULATORY_CARE_PROVIDER_SITE_OTHER): Payer: Medicare Other | Admitting: Internal Medicine

## 2013-09-24 ENCOUNTER — Encounter: Payer: Self-pay | Admitting: Internal Medicine

## 2013-09-24 VITALS — BP 140/84 | HR 72 | Temp 97.5°F | Wt 107.0 lb

## 2013-09-24 DIAGNOSIS — F3289 Other specified depressive episodes: Secondary | ICD-10-CM

## 2013-09-24 DIAGNOSIS — F32A Depression, unspecified: Secondary | ICD-10-CM

## 2013-09-24 DIAGNOSIS — I10 Essential (primary) hypertension: Secondary | ICD-10-CM

## 2013-09-24 DIAGNOSIS — N183 Chronic kidney disease, stage 3 unspecified: Secondary | ICD-10-CM

## 2013-09-24 DIAGNOSIS — R634 Abnormal weight loss: Secondary | ICD-10-CM

## 2013-09-24 DIAGNOSIS — M159 Polyosteoarthritis, unspecified: Secondary | ICD-10-CM

## 2013-09-24 DIAGNOSIS — M150 Primary generalized (osteo)arthritis: Secondary | ICD-10-CM

## 2013-09-24 DIAGNOSIS — M8949 Other hypertrophic osteoarthropathy, multiple sites: Secondary | ICD-10-CM

## 2013-09-24 DIAGNOSIS — F329 Major depressive disorder, single episode, unspecified: Secondary | ICD-10-CM

## 2013-09-24 DIAGNOSIS — K219 Gastro-esophageal reflux disease without esophagitis: Secondary | ICD-10-CM

## 2013-09-24 DIAGNOSIS — N3281 Overactive bladder: Secondary | ICD-10-CM

## 2013-09-24 DIAGNOSIS — N318 Other neuromuscular dysfunction of bladder: Secondary | ICD-10-CM

## 2013-09-24 HISTORY — DX: Other hypertrophic osteoarthropathy, multiple sites: M89.49

## 2013-09-24 HISTORY — DX: Primary generalized (osteo)arthritis: M15.0

## 2013-09-24 HISTORY — DX: Polyosteoarthritis, unspecified: M15.9

## 2013-09-24 MED ORDER — OMEPRAZOLE 20 MG PO CPDR: 20.0000 mg | DELAYED_RELEASE_CAPSULE | Freq: Every day | ORAL | Status: AC

## 2013-09-24 MED ORDER — TRAMADOL HCL 50 MG PO TABS: 50.0000 mg | ORAL_TABLET | Freq: Every day | ORAL | Status: DC

## 2013-09-24 MED ORDER — OMEPRAZOLE 20 MG PO CPDR: 20.0000 mg | DELAYED_RELEASE_CAPSULE | Freq: Every day | ORAL | Status: DC

## 2013-09-24 MED ORDER — DICLOFENAC SODIUM 1 % TD GEL: 2.0000 g | Freq: Two times a day (BID) | TRANSDERMAL | Status: DC

## 2013-09-24 MED ORDER — MIRABEGRON ER 25 MG PO TB24: 25.0000 mg | ORAL_TABLET | Freq: Every day | ORAL | Status: AC

## 2013-09-24 MED ORDER — MIRABEGRON ER 25 MG PO TB24: 25.0000 mg | ORAL_TABLET | Freq: Every day | ORAL | Status: DC

## 2013-09-24 NOTE — Progress Notes (Signed)
Patient ID: Carrie Moran, female   DOB: 09/09/1917, 80 y.o.   MRN: [id number]    Chief Complaint  Patient presents with  . Medical Management of Chronic Issues    blood pressure, depression, PVD, 3 month follow-up. With daughter-in-law Threasa Beards.    Allergies  Allergen Reactions  . Sulfa Antibiotics    HPI Patient here with her daughter in law for follow up. bp reading much improved. Continues to have overactive bladder. Has been having dry mouth. Her knee joint and right shoulder joint bothers her and limits her ROM. Resides in independent living in [place]. Reviewed meds. Mood imporved. Gained some weight and po intake improved  Review of Systems  Constitutional: Negative for fever, chills, weight loss, malaise/fatigue and diaphoresis.  HENT: Negative for congestion, hearing loss and sore throat.   Eyes: Negative for blurred vision, double vision and discharge.  Respiratory: Negative for cough, sputum production, shortness of breath and wheezing.   Cardiovascular: Negative for chest pain, palpitations, orthopnea and leg swelling.  Gastrointestinal: Negative for heartburn, nausea, vomiting Genitourinary: Negative for dysuria and flank pain.  Musculoskeletal: Negative for back pain, falls. has joint pain  Skin: Negative for itching and rash.  Neurological:  Negative for dizziness, tingling, focal weakness and headaches.  Psychiatric/Behavioral: The patient is not nervous/anxious.     Physical exam BP 140/84  Pulse 72  Temp(Src) 97.5 F (36.4 C) (Oral)  Wt 107 lb (48.535 kg)  General- elderly female in no acute distress, frail Head- atraumatic, normocephalic Cardiovascular- normal s1,s2, no murmurs/ rubs/ gallops Respiratory- bilateral clear to auscultation, no wheeze, no rhonchi, no crackles Abdomen- bowel sounds present, soft, non tender Musculoskeletal- able to move all 4 extremities, no spinal and paraspinal tenderness,no use of assistive device, no leg  edema Neurological- no focal deficit Skin- warm and dry Psychiatry- alert and oriented, normal mood and affect  Labs- Lab Results  Component Value Date   CREATININE 1.61* 07/09/2013   Lab Results  Component Value Date   WBC 4.0 07/09/2013   HGB 13.3 07/09/2013   HCT 39.8 07/09/2013   MCV 92 07/09/2013   PLT 179 06/20/2013   Assessment/plan  1. Essential hypertension, benign Stable and improved from before. Facility reading reviewed. Continue current regimen. No changes made  2. Gastroesophageal reflux disease, esophagitis presence not specified Improved. Decrease omeprazole to 20 mg daily and reduce further next visit if tolerated  3. OAB (overactive bladder) toviaz not helping and pt has dry mouth. Discontinue toviaz. Have tried vesicare in past. Will try mirabegron 25 mg daily and reassess  4. CKD (chronic kidney disease) stage 3, GFR 30-59 ml/min Avoid NSAIDs  5. Depression Stable with remeron- mood and weight have improved  6. Loss of weight Stable. Appetite improved. Continue remeron  7. OA Will have her on voltaren gel bid to affected areas and tramadol once a day for now and reassess

## 2013-09-24 NOTE — Patient Instructions (Signed)
Stop taking TOVIAZ  Stop taking prilosec 40 mg daily  Only take prilosec 20 mg daily  Take tramadol 50 mg once a day for pain from arthritis  Start mirabegron 25 mg daily for overactive bladder  New medication voltaren gel to be applied to your knee and shoulder twice a day for pain  Please give updated medication list to your nurse

## 2013-10-12 ENCOUNTER — Emergency Department (HOSPITAL_COMMUNITY)
Admission: EM | Admit: 2013-10-12 | Discharge: 2013-10-12 | Disposition: A | Discharge status: Home / Self Care | Payer: Medicare Other | Attending: Emergency Medicine | Admitting: Emergency Medicine

## 2013-10-12 ENCOUNTER — Encounter (HOSPITAL_COMMUNITY): Payer: Self-pay | Admitting: Emergency Medicine

## 2013-10-12 ENCOUNTER — Emergency Department (HOSPITAL_COMMUNITY): Payer: Medicare Other

## 2013-10-12 DIAGNOSIS — M25551 Pain in right hip: Secondary | ICD-10-CM

## 2013-10-12 DIAGNOSIS — Z87891 Personal history of nicotine dependence: Secondary | ICD-10-CM | POA: Insufficient documentation

## 2013-10-12 DIAGNOSIS — S79929A Unspecified injury of unspecified thigh, initial encounter: Secondary | ICD-10-CM

## 2013-10-12 DIAGNOSIS — Y929 Unspecified place or not applicable: Secondary | ICD-10-CM | POA: Insufficient documentation

## 2013-10-12 DIAGNOSIS — Y9389 Activity, other specified: Secondary | ICD-10-CM | POA: Insufficient documentation

## 2013-10-12 DIAGNOSIS — R42 Dizziness and giddiness: Secondary | ICD-10-CM | POA: Insufficient documentation

## 2013-10-12 DIAGNOSIS — G2581 Restless legs syndrome: Secondary | ICD-10-CM | POA: Insufficient documentation

## 2013-10-12 DIAGNOSIS — K219 Gastro-esophageal reflux disease without esophagitis: Secondary | ICD-10-CM | POA: Insufficient documentation

## 2013-10-12 DIAGNOSIS — S79919A Unspecified injury of unspecified hip, initial encounter: Secondary | ICD-10-CM | POA: Insufficient documentation

## 2013-10-12 DIAGNOSIS — Z7982 Long term (current) use of aspirin: Secondary | ICD-10-CM | POA: Insufficient documentation

## 2013-10-12 DIAGNOSIS — W19XXXA Unspecified fall, initial encounter: Secondary | ICD-10-CM

## 2013-10-12 DIAGNOSIS — Z8739 Personal history of other diseases of the musculoskeletal system and connective tissue: Secondary | ICD-10-CM | POA: Insufficient documentation

## 2013-10-12 DIAGNOSIS — Z87448 Personal history of other diseases of urinary system: Secondary | ICD-10-CM | POA: Insufficient documentation

## 2013-10-12 DIAGNOSIS — Z79899 Other long term (current) drug therapy: Secondary | ICD-10-CM | POA: Insufficient documentation

## 2013-10-12 DIAGNOSIS — W010XXA Fall on same level from slipping, tripping and stumbling without subsequent striking against object, initial encounter: Secondary | ICD-10-CM | POA: Insufficient documentation

## 2013-10-12 DIAGNOSIS — I10 Essential (primary) hypertension: Secondary | ICD-10-CM | POA: Insufficient documentation

## 2013-10-12 LAB — URINALYSIS, ROUTINE W REFLEX MICROSCOPIC
Bilirubin Urine: NEGATIVE
Glucose, UA: NEGATIVE mg/dL
HGB URINE DIPSTICK: NEGATIVE
Ketones, ur: NEGATIVE mg/dL
Leukocytes, UA: NEGATIVE
Nitrite: NEGATIVE
Protein, ur: NEGATIVE mg/dL
Specific Gravity, Urine: 1.011 (ref 1.005–1.030)
Urobilinogen, UA: 1 mg/dL (ref 0.0–1.0)
pH: 7.5 (ref 5.0–8.0)

## 2013-10-12 LAB — COMPREHENSIVE METABOLIC PANEL
ALT: 11 U/L (ref 0–35)
ANION GAP: 15 (ref 5–15)
AST: 21 U/L (ref 0–37)
Albumin: 3.9 g/dL (ref 3.5–5.2)
Alkaline Phosphatase: 69 U/L (ref 39–117)
BUN: 31 mg/dL — ABNORMAL HIGH (ref 6–23)
CALCIUM: 9.6 mg/dL (ref 8.4–10.5)
CO2: 23 mEq/L (ref 19–32)
CREATININE: 1.28 mg/dL — AB (ref 0.50–1.10)
Chloride: 105 mEq/L (ref 96–112)
GFR calc non Af Amer: 35 mL/min — ABNORMAL LOW (ref >90)
GFR, EST AFRICAN AMERICAN: 40 mL/min — AB (ref >90)
GLUCOSE: 105 mg/dL — AB (ref 70–99)
Potassium: 3.9 mEq/L (ref 3.7–5.3)
Sodium: 143 mEq/L (ref 137–147)
Total Bilirubin: 0.3 mg/dL (ref 0.3–1.2)
Total Protein: 7.1 g/dL (ref 6.0–8.3)

## 2013-10-12 LAB — CBC WITH DIFFERENTIAL/PLATELET
Basophils Absolute: 0 10*3/uL (ref 0.0–0.1)
Basophils Relative: 0 % (ref 0–1)
EOS ABS: 0.2 10*3/uL (ref 0.0–0.7)
EOS PCT: 4 % (ref 0–5)
HCT: 39.4 % (ref 36.0–46.0)
Hemoglobin: 13.3 g/dL (ref 12.0–15.0)
LYMPHS ABS: 0.5 10*3/uL — AB (ref 0.7–4.0)
Lymphocytes Relative: 10 % — ABNORMAL LOW (ref 12–46)
MCH: 30.9 pg (ref 26.0–34.0)
MCHC: 33.8 g/dL (ref 30.0–36.0)
MCV: 91.4 fL (ref 78.0–100.0)
MONOS PCT: 9 % (ref 3–12)
Monocytes Absolute: 0.5 10*3/uL (ref 0.1–1.0)
Neutro Abs: 4.2 10*3/uL (ref 1.7–7.7)
Neutrophils Relative %: 77 % (ref 43–77)
PLATELETS: 120 10*3/uL — AB (ref 150–400)
RBC: 4.31 MIL/uL (ref 3.87–5.11)
RDW: 13.1 % (ref 11.5–15.5)
WBC: 5.4 10*3/uL (ref 4.0–10.5)

## 2013-10-12 LAB — I-STAT TROPONIN, ED: TROPONIN I, POC: 0.01 ng/mL (ref 0.00–0.08)

## 2013-10-12 MED ORDER — ACETAMINOPHEN 325 MG PO TABS
650.0000 mg | ORAL_TABLET | Freq: Once | ORAL | Status: AC
  Administered 2013-10-12: 650 mg via ORAL
  Filled 2013-10-12: qty 2

## 2013-10-12 NOTE — Discharge Instructions (Signed)
Hip Pain The hips join the upper legs to the lower pelvis. The bones, cartilage, tendons, and muscles of the hip joint perform a lot of work each day holding your body weight and allowing you to move around. Hip pain is a common symptom. It can range from a minor ache to severe pain on 1 or both hips. Pain may be felt on the inside of the hip joint near the groin, or the outside near the buttocks and upper thigh. There may be swelling or stiffness as well. It occurs more often when a person walks or performs activity. There are many reasons hip pain can develop. CAUSES  It is important to work with your caregiver to identify the cause since many conditions can impact the bones, cartilage, muscles, and tendons of the hips. Causes for hip pain include:  Broken (fractured) bones.  Separation of the thighbone from the hip socket (dislocation).  Torn cartilage of the hip joint.  Swelling (inflammation) of a tendon (tendonitis), the sac within the hip joint (bursitis), or a joint.  A weakening in the abdominal wall (hernia), affecting the nerves to the hip.  Arthritis in the hip joint or lining of the hip joint.  Pinched nerves in the back, hip, or upper thigh.  A bulging disc in the spine (herniated disc).  Rarely, bone infection or cancer. DIAGNOSIS  The location of your hip pain will help your caregiver understand what may be causing the pain. A diagnosis is based on your medical history, your symptoms, results from your physical exam, and results from diagnostic tests. Diagnostic tests may include X-ray exams, a computerized magnetic scan (magnetic resonance imaging, MRI), or bone scan. TREATMENT  Treatment will depend on the cause of your hip pain. Treatment may include:  Limiting activities and resting until symptoms improve.  Crutches or other walking supports (a cane or brace).  Ice, elevation, and compression.  Physical therapy or home exercises.  Shoe inserts or special  shoes.  Losing weight.  Medications to reduce pain.  Undergoing surgery. HOME CARE INSTRUCTIONS   Only take over-the-counter or prescription medicines for pain, discomfort, or fever as directed by your caregiver.  Put ice on the injured area:  Put ice in a plastic bag.  Place a towel between your skin and the bag.  Leave the ice on for 15-20 minutes at a time, 03-04 times a day.  Keep your leg raised (elevated) when possible to lessen swelling.  Avoid activities that cause pain.  Follow specific exercises as directed by your caregiver.  Sleep with a pillow between your legs on your most comfortable side.  Record how often you have hip pain, the location of the pain, and what it feels like. This information may be helpful to you and your caregiver.  Ask your caregiver about returning to work or sports and whether you should drive.  Follow up with your caregiver for further exams, therapy, or testing as directed. SEEK MEDICAL CARE IF:   Your pain or swelling continues or worsens after 1 week.  You are feeling unwell or have chills.  You have increasing difficulty with walking.  You have a loss of sensation or other new symptoms.  You have questions or concerns. SEEK IMMEDIATE MEDICAL CARE IF:   You cannot put weight on the affected hip.  You have fallen.  You have a sudden increase in pain and swelling in your hip.  You have a fever. MAKE SURE YOU:   Understand these instructions.  Will watch your condition.  Will get help right away if you are not doing well or get worse. Document Released: 09/08/2009 Document Revised: 06/13/2011 Document Reviewed: 09/08/2009 [facility] Patient Information 2015 [place], [place]. This information is not intended to replace advice given to you by your health care provider. Make sure you discuss any questions you have with your health care provider.

## 2013-10-12 NOTE — ED Provider Notes (Signed)
CSN: [id number]     Arrival date & time 10/12/13  1823 History   First MD Initiated Contact with Patient 10/12/13 1917     Chief Complaint  Patient presents with  . Hypertension  . Fall   HPI Comments: Patient is a 80 y.o. Female who presents the the [facility] ED from a nursing home with a complaint of fall with dizziness.  Patient was incidentally noted to have hypertension en route by EMS.  Patient states that she was trying to put the phone away when she got tripped up over her feet and fell on the right hip.  Patient states that she laid on the floor for about an hour before somebody came to help her get up.  She states that she was unable to get herself up because she became dizzy when she tried to get herself up.  She denies hitting her head when she fell on the floor.    Patient is a 80 y.o. female presenting with hypertension and fall.  Hypertension Associated symptoms include arthralgias. Pertinent negatives include no abdominal pain, chest pain, chills, fatigue, fever, joint swelling, nausea, neck pain or vomiting.  Fall Associated symptoms include arthralgias. Pertinent negatives include no abdominal pain, chest pain, chills, fatigue, fever, joint swelling, nausea, neck pain or vomiting.    Past Medical History  Diagnosis Date  . Hypertension   . Memory loss   . GERD (gastroesophageal reflux disease)   . RLS (restless legs syndrome)   . Hypertonicity of bladder   . Renal insufficiency   . Other headache syndromes(339.89)   . Osteoporosis, unspecified   . Urinary frequency    Past Surgical History  Procedure Laterality Date  . Appendectomy  1930  . Rotator cuff repair  1986  . Rotator cuff repair  2001  . Carpal tunnel release  2006    Bilateral    Family History  Problem Relation Age of Onset  . Heart attack Mother   . Cancer Brother   . Cancer Brother    History  Substance Use Topics  . Smoking status: Former Research scientist (life sciences)  . Smokeless tobacco: Never Used  . Alcohol Use:  No   OB History   Grav Para Term Preterm Abortions TAB SAB Ect Mult Living                 Review of Systems  Constitutional: Negative for fever, chills and fatigue.  Respiratory: Negative for chest tightness and shortness of breath.   Cardiovascular: Negative for chest pain, palpitations and leg swelling.  Gastrointestinal: Negative for nausea, vomiting, abdominal pain, diarrhea, constipation and blood in stool.  Genitourinary: Negative for dysuria, urgency, frequency, hematuria and difficulty urinating.  Musculoskeletal: Positive for arthralgias. Negative for gait problem, joint swelling, neck pain and neck stiffness.      Allergies  Sulfa antibiotics  Home Medications   Prior to Admission medications   Medication Sig Start Date End Date Taking? Authorizing Provider  amLODipine (NORVASC) 10 MG tablet Take one tablet once daily to control Blood pressure   Yes Historical Provider, MD  aspirin 81 MG tablet Take 81 mg by mouth daily.   Yes Historical Provider, MD  cilostazol (PLETAL) 50 MG tablet Take 50 mg by mouth 2 (two) times daily.   Yes Historical Provider, MD  cloNIDine (CATAPRES) 0.1 MG tablet Take 1 tablet (0.1 mg total) by mouth 2 (two) times daily. 10/24/12  Yes Mahima Bubba Camp, MD  diclofenac sodium (VOLTAREN) 1 % GEL Apply 2 g topically  2 (two) times daily. 09/24/13  Yes Mahima Pandey, MD  latanoprost (XALATAN) 0.005 % ophthalmic solution Place 1 drop into both eyes daily. Instill 1 drop in each eye once daily for glaucoma.   Yes Historical Provider, MD  metoprolol tartrate (LOPRESSOR) 25 MG tablet Take 1 tablet (25 mg total) by mouth 2 (two) times daily. 07/09/13  Yes Mahima Pandey, MD  mirabegron ER (MYRBETRIQ) 25 MG TB24 tablet Take 1 tablet (25 mg total) by mouth daily. 09/24/13  Yes Mahima Pandey, MD  mirtazapine (REMERON) 15 MG tablet Take 15 mg by mouth at bedtime.   Yes Historical Provider, MD  omeprazole (PRILOSEC) 20 MG capsule Take 1 capsule (20 mg total) by mouth  daily. 09/24/13  Yes Mahima Pandey, MD  Timolol Maleate 0.5 % (DAILY) SOLN Place 1 drop into both eyes daily.    Yes Historical Provider, MD  topiramate (TOPAMAX) 25 MG tablet Take 1 tablet (25 mg total) by mouth 2 (two) times daily. 11/07/12  Yes Mahima Bubba Camp, MD  traMADol (ULTRAM) 50 MG tablet Take 1 tablet (50 mg total) by mouth daily. 09/24/13  Yes Mahima Pandey, MD   BP 189/93  Pulse 85  Temp(Src) 100.8 F (38.2 C) (Rectal)  Resp 29  SpO2 96% Physical Exam  Constitutional: She is oriented to person, place, and time. She appears well-developed and well-nourished. No distress.  HENT:  Head: Normocephalic and atraumatic.  Mouth/Throat: Oropharynx is clear and moist. No oropharyngeal exudate.  Eyes: Conjunctivae and EOM are normal. Pupils are equal, round, and reactive to light. No scleral icterus.  Neck: Normal range of motion. Neck supple. No JVD present. No thyromegaly present.  Cardiovascular: Normal rate, regular rhythm and intact distal pulses.  Exam reveals no gallop and no friction rub.   No murmur heard. Pulmonary/Chest: Effort normal and breath sounds normal. No respiratory distress. She has no wheezes. She has no rales. She exhibits no tenderness.  Abdominal: Soft. Bowel sounds are normal. She exhibits no distension and no mass. There is no tenderness. There is no rebound and no guarding.  Musculoskeletal: Normal range of motion.       Right hip: She exhibits tenderness. She exhibits normal range of motion, normal strength, no bony tenderness, no swelling, no crepitus, no deformity and no laceration.  Lymphadenopathy:    She has no cervical adenopathy.  Neurological: She is alert and oriented to person, place, and time. No cranial nerve deficit. Coordination normal.  Skin: Skin is warm and dry. She is not diaphoretic.  Psychiatric: She has a normal mood and affect. Her behavior is normal. Judgment and thought content normal.    ED Course  Procedures (including critical care  time) Labs Review Labs Reviewed  CBC WITH DIFFERENTIAL - Abnormal; Notable for the following:    Platelets 120 (*)    Lymphocytes Relative 10 (*)    Lymphs Abs 0.5 (*)    All other components within normal limits  COMPREHENSIVE METABOLIC PANEL - Abnormal; Notable for the following:    Glucose, Bld 105 (*)    BUN 31 (*)    Creatinine, Ser 1.28 (*)    GFR calc non Af Amer 35 (*)    GFR calc Af Amer 40 (*)    All other components within normal limits  URINALYSIS, ROUTINE W REFLEX MICROSCOPIC - Abnormal; Notable for the following:    APPearance CLOUDY (*)    All other components within normal limits  I-STAT TROPOININ, ED    Imaging Review Dg Chest 2  View  10/12/2013   CLINICAL DATA:  Status post fall.  Syncope.  EXAM: CHEST  2 VIEW  COMPARISON:  And single view of the chest 11/01/2012.  FINDINGS: Lungs are clear. No pneumothorax or pleural effusion. Cardiomegaly is noted. Remote left second rib fracture is noted. No acute fracture is identified. Degenerative change is present about the shoulders.  IMPRESSION: Cardiomegaly without acute disease.   Electronically Signed   By: Inge Rise M.D.   On: 10/12/2013 21:34   Dg Hip Complete Right  10/12/2013   CLINICAL DATA:  Fall, right hip pain  EXAM: RIGHT HIP - COMPLETE 2+ VIEW  COMPARISON:  08/07/2008  FINDINGS: Moderate to severe right hip degenerative change with overhanging osteophytosis is reidentified. Allowing for this, which may mimic or obscure a subtle subcapital fracture, no displaced fracture is identified. The right femoral head is properly located. The appearance is unchanged since previously. Right iliac bone island and pelvic phleboliths reidentified. Vascular calcifications are noted. Lumbar spine degenerative change partly visualized. Mild left hip degenerative change is also noted. No displaced pelvic fracture.  IMPRESSION: Moderate severe right hip degenerative change without focal acute finding.   Electronically Signed   By:  Conchita Paris M.D.   On: 10/12/2013 20:05     EKG Interpretation None      MDM   Final diagnoses:  Essential hypertension  Dizziness  Fall, initial encounter  Right hip pain   Patient is a 80 y.o. Female who presents to the ED after a mechanical fall. Patient had some mild right hip pain on initial exam.  Plain film of the hip are without any obvious fracture.  Patient did complain of some mild dizziness.  Patient had a CBC, CMP, EKG, CXR, and UA which were unremarkable.  Patient did have a mild temperature here in the ED of 100.8 F.  She was treated with some tylenol.  Patient was able to ambulate.  Patient is stable for discharge back to the nursing home at this time.      Bostwick A Forcucci, PA-C 10/12/13 2227  At approximately 10:30 pm I was found by the nurse stating that the patient had a less steady gait and said that she felt a little off kilter.  I asked for orthostatic pressures and the patient was found to have a 10 mm drop in systolic from sitting to standing.  Patient is tachypnic and did have fever here, but no source for infection at this time.  Patient stable for discharge.     Cherylann Parr, PA-C 10/12/13 2334

## 2013-10-12 NOTE — ED Notes (Signed)
Patient states she fell on her right hip, but reports she doesn't have any pain.  Alert and oriented x 2.  Patient reports her feet got tangled up and she fell beside her bed and laid there an hour before anyone found her.

## 2013-10-12 NOTE — ED Notes (Signed)
Patient reports she was dizzy after falling but did not hit her head.

## 2013-10-12 NOTE — ED Notes (Signed)
Pt ambulated to the bathroom w/ one assist.  Pt had a steady gait.

## 2013-10-12 NOTE — ED Notes (Signed)
Per EMS-fell today and laid on ground till Tech arrived, no injury from fall-once she got up she felt dizzy, BP 204/104-no head injury, no LOC-no complaints of pain-states dizziness not bas, "just a little"

## 2013-10-12 NOTE — ED Notes (Signed)
Bed: WA18 Expected date:  Expected time:  Means of arrival:  Comments: EMS 

## 2013-10-14 NOTE — ED Provider Notes (Signed)
Medical screening examination/treatment/procedure(s) were conducted as a shared visit with non-physician practitioner(s) and myself.  I personally evaluated the patient during the encounter.   EKG Interpretation None     Patient with fall. Some generalized weakness. Mild temperature without clear source. Does not appear to need lumbar puncture. Patient feels better and be discharged back to the nursing home  Jasper Riling. Alvino Chapel, MD 10/14/13 [id number]

## 2013-11-26 ENCOUNTER — Ambulatory Visit (INDEPENDENT_AMBULATORY_CARE_PROVIDER_SITE_OTHER): Payer: Medicare Other | Admitting: Internal Medicine

## 2013-11-26 ENCOUNTER — Ambulatory Visit: Payer: Medicare Other | Admitting: Internal Medicine

## 2013-11-26 ENCOUNTER — Encounter: Payer: Self-pay | Admitting: Internal Medicine

## 2013-11-26 VITALS — BP 118/74 | HR 72 | Temp 97.6°F | Ht 63.0 in | Wt 102.6 lb

## 2013-11-26 DIAGNOSIS — I10 Essential (primary) hypertension: Secondary | ICD-10-CM

## 2013-11-26 DIAGNOSIS — N183 Chronic kidney disease, stage 3 unspecified: Secondary | ICD-10-CM

## 2013-11-26 DIAGNOSIS — N3281 Overactive bladder: Secondary | ICD-10-CM

## 2013-11-26 DIAGNOSIS — R350 Frequency of micturition: Secondary | ICD-10-CM

## 2013-11-26 DIAGNOSIS — K219 Gastro-esophageal reflux disease without esophagitis: Secondary | ICD-10-CM

## 2013-11-26 DIAGNOSIS — N318 Other neuromuscular dysfunction of bladder: Secondary | ICD-10-CM

## 2013-11-26 LAB — POCT URINALYSIS DIPSTICK
Bilirubin, UA: NEGATIVE
Blood, UA: NEGATIVE
Glucose, UA: NEGATIVE
KETONES UA: NEGATIVE
Nitrite, UA: NEGATIVE
Protein, UA: NEGATIVE
Spec Grav, UA: <=1.005
UROBILINOGEN UA: 0.2
pH, UA: 6

## 2013-11-26 MED ORDER — MIRTAZAPINE 15 MG PO TABS: 7.5000 mg | ORAL_TABLET | Freq: Every day | ORAL | Status: DC

## 2013-11-26 NOTE — Progress Notes (Signed)
Patient ID: Carrie Moran, female   DOB: 09/30/1917, 80 y.o.   MRN: [id number]    Chief Complaint  Patient presents with  . Follow-up    2 month f/u OAB, Arthritis, BP, & discuss Genetic Test Results  . other    frequent urination x 2-3 weeks; neg for fall screening   Allergies  Allergen Reactions  . Sulfa Antibiotics    HPI 80 y/o female pt here for follow up visit. She has frequent urination for 2-3 weeks. She feels that myrbetriq is not helping with her bladder symptom. Reviewed genetic test results  Wt Readings from Last 3 Encounters:  11/26/13 102 lb 9.6 oz (46.539 kg)  09/24/13 107 lb (48.535 kg)  08/07/13 105 lb 12.8 oz (47.991 kg)   Her bp reading looks controlled today OA pain controlled with her tramadol  ROS Constitutional: Negative for fever, chills, weight loss, malaise/fatigue and diaphoresis.   HENT: Negative for congestion, hearing loss and sore throat.    Eyes: Negative for blurred vision, double vision and discharge.   Respiratory: Negative for cough, sputum production, shortness of breath and wheezing.    Cardiovascular: Negative for chest pain, palpitations, orthopnea and leg swelling.   Gastrointestinal: Negative for heartburn, nausea, vomiting.   Musculoskeletal: Negative for back pain, falls. has joint pain   Skin: Negative for itching and rash.   Neurological:  Negative for dizziness, tingling, focal weakness and headaches.   Psychiatric/Behavioral: The patient is not nervous/anxious.    Past Medical History  Diagnosis Date  . Hypertension   . Memory loss   . GERD (gastroesophageal reflux disease)   . RLS (restless legs syndrome)   . Hypertonicity of bladder   . Renal insufficiency   . Other headache syndromes(339.89)   . Osteoporosis, unspecified   . Urinary frequency    Medication reviewed. See [facility]  Physical exam BP 118/74  Pulse 72  Temp(Src) 97.6 F (36.4 C) (Oral)  Ht 5\' 3"  (1.6 m)  Wt 102 lb 9.6 oz (46.539 kg)  BMI 18.18 kg/m2   SpO2 96%  General- elderly female in no acute distress, frail Head- atraumatic, normocephalic Cardiovascular- normal s1,s2, no murmurs Respiratory- bilateral clear to auscultation, no wheeze, no rhonchi, no crackles Abdomen- bowel sounds present, soft, non tender Musculoskeletal- able to move all 4 extremities, no spinal and paraspinal tenderness,no use of assistive device, no leg edema Neurological- no focal deficit Skin- warm and dry Psychiatry- alert and oriented, normal mood and affect   Labs CBC Latest Ref Rng 10/12/2013 07/09/2013 06/20/2013  WBC 4.0 - 10.5 K/uL 5.4 4.0 5.4  Hemoglobin 12.0 - 15.0 g/dL 13.3 13.3 13.4  Hematocrit 36.0 - 46.0 % 39.4 39.8 40.2  Platelets 150 - 400 K/uL 120(L) - 179   CMP     Component Value Date/Time   NA 143 10/12/2013 1853   NA 142 07/09/2013 1250   K 3.9 10/12/2013 1853   CL 105 10/12/2013 1853   CO2 23 10/12/2013 1853   GLUCOSE 105* 10/12/2013 1853   GLUCOSE 88 07/09/2013 1250   BUN 31* 10/12/2013 1853   BUN 29 07/09/2013 1250   CREATININE 1.28* 10/12/2013 1853   CALCIUM 9.6 10/12/2013 1853   PROT 7.1 10/12/2013 1853   PROT 5.8* 11/07/2012 1545   ALBUMIN 3.9 10/12/2013 1853   AST 21 10/12/2013 1853   ALT 11 10/12/2013 1853   ALKPHOS 69 10/12/2013 1853   BILITOT 0.3 10/12/2013 1853   GFRNONAA 35* 10/12/2013 1853   GFRAA 40* 10/12/2013  1853   Urinalysis    Component Value Date/Time   COLORURINE YELLOW 10/12/2013 1935   APPEARANCEUR CLOUDY* 10/12/2013 1935   LABSPEC 1.011 10/12/2013 1935   PHURINE 7.5 10/12/2013 1935   GLUCOSEU NEGATIVE 10/12/2013 1935   HGBUR NEGATIVE 10/12/2013 1935   BILIRUBINUR neg 11/26/2013 1137   BILIRUBINUR NEGATIVE 10/12/2013 1935   KETONESUR NEGATIVE 10/12/2013 1935   PROTEINUR neg 11/26/2013 1137   PROTEINUR NEGATIVE 10/12/2013 1935   UROBILINOGEN 0.2 11/26/2013 1137   UROBILINOGEN 1.0 10/12/2013 1935   NITRITE neg 11/26/2013 1137   NITRITE NEGATIVE 10/12/2013 1935   LEUKOCYTESUR small (1+) 11/26/2013 1137    Assessment/plan  1.  Frequency of urination Will send her urine for culture study. Encouraged hydration. Her OAB likely contributing to this as well. Consider antibiotics if has uti - POC Urinalysis Dipstick - Culture, Urine  2. Essential hypertension, benign Stable, cotninue clonidine and amlodipine with lopressor - CMP - Lipid Panel - CBC with Differential - TSH  3. Gastroesophageal reflux disease, esophagitis presence not specified Continue prilosec for now  4. OAB (overactive bladder) Continue myrbetriq for now. Have tried other medication for UI without much help. This has not helped her symptom much either. Advised on seeing urology- pt and son to decide on this and will let me know whether to provide urology referral   5. CKD (chronic kidney disease) stage 3, GFR 30-59 ml/min Slow decline- avoid nephrotoxic agents. Monitor clinically - TSH  Will wean her off remeron with her mood being fine and appetite being normal. Also increased risk of metabolic interaction with topamax

## 2013-11-28 LAB — URINE CULTURE

## 2013-11-29 NOTE — Progress Notes (Signed)
LMOM for Carrie Moran to Return call # [phone]

## 2013-12-12 ENCOUNTER — Encounter: Payer: Self-pay | Admitting: Internal Medicine

## 2014-02-19 ENCOUNTER — Ambulatory Visit (INDEPENDENT_AMBULATORY_CARE_PROVIDER_SITE_OTHER): Payer: Medicare Other | Admitting: Internal Medicine

## 2014-02-19 ENCOUNTER — Encounter: Payer: Self-pay | Admitting: Internal Medicine

## 2014-02-19 VITALS — BP 140/60 | HR 69 | Temp 96.3°F | Ht 63.0 in | Wt 101.8 lb

## 2014-02-19 DIAGNOSIS — R600 Localized edema: Secondary | ICD-10-CM

## 2014-02-19 DIAGNOSIS — N3281 Overactive bladder: Secondary | ICD-10-CM

## 2014-02-19 DIAGNOSIS — N393 Stress incontinence (female) (male): Secondary | ICD-10-CM

## 2014-02-19 NOTE — Progress Notes (Signed)
Patient ID: Carrie Moran, female   DOB: July 26, 1917, 80 y.o.   MRN: [id number]    Chief Complaint  Patient presents with  . Acute Visit    Left ankle swollen   Allergies  Allergen Reactions  . Sulfa Antibiotics    HPI 80 y/o female pt here with her son for acute concerns. She has been having swelling in her ankles for a week now. She has been keeping them elevated when possible. She mentions the swelling increasing by end of the day. She denies any leg pain. Her weight is unchanged on review. Denies redness in her legs. She continues to have increased urinary frequency and incontinence. She is on myrbetriq and this is not helping much. She agrees to seeing a urologist this visit  Wt Readings from Last 3 Encounters:  02/19/14 101 lb 12.8 oz (46.176 kg)  11/26/13 102 lb 9.6 oz (46.539 kg)  09/24/13 107 lb (48.535 kg)   Review of Systems   Constitutional: Negative for fever, chills, weight loss, malaise/fatigue and diaphoresis.   HENT: Negative for congestion, hearing loss and sore throat.    Eyes: Negative for blurred vision, double vision and discharge.   Respiratory: Negative for cough, sputum production, shortness of breath and wheezing.    Cardiovascular: Negative for chest pain, palpitations, orthopnea and leg swelling.   Gastrointestinal: Negative for heartburn, nausea, vomiting Genitourinary: Negative for dysuria and flank pain.   Neurological:  Negative for dizziness, tingling, focal weakness and headaches.   Psychiatric/Behavioral: The patient is not nervous/anxious.    Past Medical History  Diagnosis Date  . Hypertension   . Memory loss   . GERD (gastroesophageal reflux disease)   . RLS (restless legs syndrome)   . Hypertonicity of bladder   . Renal insufficiency   . Other headache syndromes(339.89)   . Osteoporosis, unspecified   . Urinary frequency    Current Outpatient Prescriptions on File Prior to Visit  Medication Sig Dispense Refill  . amLODipine (NORVASC)  10 MG tablet Take one tablet once daily to control Blood pressure    . aspirin 81 MG tablet Take 81 mg by mouth daily.    . cilostazol (PLETAL) 50 MG tablet Take 50 mg by mouth 2 (two) times daily.    . cloNIDine (CATAPRES) 0.1 MG tablet Take 1 tablet (0.1 mg total) by mouth 2 (two) times daily. 60 tablet 3  . diclofenac sodium (VOLTAREN) 1 % GEL Apply 2 g topically 2 (two) times daily. 1 Tube 1  . latanoprost (XALATAN) 0.005 % ophthalmic solution Place 1 drop into both eyes daily. Instill 1 drop in each eye once daily for glaucoma.    . metoprolol tartrate (LOPRESSOR) 25 MG tablet Take 1 tablet (25 mg total) by mouth 2 (two) times daily. 60 tablet 3  . mirabegron ER (MYRBETRIQ) 25 MG TB24 tablet Take 1 tablet (25 mg total) by mouth daily. 30 tablet 3  . mirtazapine (REMERON) 15 MG tablet Take 0.5 tablets (7.5 mg total) by mouth at bedtime. Take half a tablet once a day for one week and then stop it 10 tablet 0  . omeprazole (PRILOSEC) 20 MG capsule Take 1 capsule (20 mg total) by mouth daily. 30 capsule 3  . Timolol Maleate 0.5 % (DAILY) SOLN Place 1 drop into both eyes daily.     Marland Kitchen topiramate (TOPAMAX) 25 MG tablet Take 1 tablet (25 mg total) by mouth 2 (two) times daily. 60 tablet 1  . traMADol (ULTRAM) 50 MG tablet  Take 1 tablet (50 mg total) by mouth daily. 30 tablet 5   No current facility-administered medications on file prior to visit.    Physical exam BP 140/60 mmHg  Pulse 69  Temp(Src) 96.3 F (35.7 C) (Oral)  Ht 5\' 3"  (1.6 m)  Wt 101 lb 12.8 oz (46.176 kg)  BMI 18.04 kg/m2  SpO2 98%  General- elderly female in no acute distress, frail Head- atraumatic, normocephalic Cardiovascular- normal s1,s2, no murmurs/ rubs/ gallops Respiratory- bilateral clear to auscultation, no wheeze, no rhonchi, no crackles Musculoskeletal- able to move all 4 extremities, no spinal and paraspinal tenderness, fluctuant swelling in left knee, non tender, has trace edema around both ankles, good  dorsalis pedis, has varicose veins Neurological- no focal deficit Skin- warm and dry Psychiatry- alert and oriented, normal mood and affect  Assessment/plan  1. Stress incontinence - Ambulatory referral to Urology  2. Bilateral edema of lower extremity Likely from venous stasis given her varicose veins. Her amlodipine could be contributing some too. Will avoid diuretics for now given her age and OAB as this will worsen it. Advised on leg elevation at rest and to wear compression stocking when out of bed. Script provided. Reassess on upcoming visit  3. OAB (overactive bladder) Persists, on myrbetriq, continue this and will get urology input, referral provided

## 2014-03-04 ENCOUNTER — Encounter: Payer: Medicare Other | Admitting: Internal Medicine

## 2014-04-17 ENCOUNTER — Other Ambulatory Visit: Payer: Self-pay | Admitting: *Deleted

## 2014-04-17 MED ORDER — TRAMADOL HCL 50 MG PO TABS: ORAL_TABLET | ORAL | Status: DC

## 2014-04-17 NOTE — Telephone Encounter (Signed)
Fax Order From [employer]

## 2014-06-06 ENCOUNTER — Encounter: Payer: Self-pay | Admitting: Internal Medicine

## 2014-06-06 ENCOUNTER — Ambulatory Visit (INDEPENDENT_AMBULATORY_CARE_PROVIDER_SITE_OTHER): Payer: Medicare Other | Admitting: Internal Medicine

## 2014-06-06 VITALS — BP 110/80 | HR 61 | Temp 97.5°F | Resp 18 | Ht 63.0 in | Wt 101.0 lb

## 2014-06-06 DIAGNOSIS — D234 Other benign neoplasm of skin of scalp and neck: Secondary | ICD-10-CM | POA: Diagnosis not present

## 2014-06-06 DIAGNOSIS — B0223 Postherpetic polyneuropathy: Secondary | ICD-10-CM | POA: Diagnosis not present

## 2014-06-06 DIAGNOSIS — N3281 Overactive bladder: Secondary | ICD-10-CM | POA: Diagnosis not present

## 2014-06-06 LAB — POCT URINALYSIS DIPSTICK
Bilirubin, UA: NEGATIVE
GLUCOSE UA: NEGATIVE
Ketones, UA: NEGATIVE
Nitrite, UA: NEGATIVE
Protein, UA: NEGATIVE
RBC UA: NEGATIVE
Spec Grav, UA: <=1.005
UROBILINOGEN UA: NEGATIVE
pH, UA: 8.5

## 2014-06-06 MED ORDER — GABAPENTIN 100 MG PO CAPS: 100.0000 mg | ORAL_CAPSULE | Freq: Every day | ORAL | Status: AC

## 2014-06-06 NOTE — Progress Notes (Signed)
Patient ID: Carrie Moran, female   DOB: 01-13-1918, 81 y.o.   MRN: [id number]    Facility  [facility]    Place of Service:   OFFICE   Allergies  Allergen Reactions  . Sulfa Antibiotics     Chief Complaint  Patient presents with  . Acute Visit    pain between her breasts; urinary frequency; scalp bump    HPI:  81 yo female seen today for pain between her breasts x 6 weeks. She lives at [facility]. Area is itchy but no rash. Pain is burning and occasionally wakes her up from sleep. She had her zostavax.  Bump on hairline noticed today by hairstylist. No pain or d/c.  Increased urinary frequency x several yrs. Has tried multiple meds in the past without relief. She did not see urologist as appt was cx by her son who could not make it.    Medications: Patient's Medications  New Prescriptions   GABAPENTIN (NEURONTIN) 100 MG CAPSULE    Take 1 capsule (100 mg total) by mouth at bedtime.  Previous Medications   AMBULATORY NON FORMULARY MEDICATION    Ted compression stockings for both legs- wear them in the morning and remove it before going to bed   AMLODIPINE (NORVASC) 10 MG TABLET    Take one tablet once daily to control Blood pressure   ASPIRIN 81 MG TABLET    Take 81 mg by mouth daily.   CILOSTAZOL (PLETAL) 50 MG TABLET    Take 50 mg by mouth 2 (two) times daily.   CLONIDINE (CATAPRES) 0.1 MG TABLET    Take 1 tablet (0.1 mg total) by mouth 2 (two) times daily.   DICLOFENAC SODIUM (VOLTAREN) 1 % GEL    Apply 2 g topically 2 (two) times daily.   LATANOPROST (XALATAN) 0.005 % OPHTHALMIC SOLUTION    Place 1 drop into both eyes daily. Instill 1 drop in each eye once daily for glaucoma.   METOPROLOL TARTRATE (LOPRESSOR) 25 MG TABLET    Take 1 tablet (25 mg total) by mouth 2 (two) times daily.   MIRABEGRON ER (MYRBETRIQ) 25 MG TB24 TABLET    Take 1 tablet (25 mg total) by mouth daily.   MIRTAZAPINE (REMERON) 15 MG TABLET    Take 0.5 tablets (7.5 mg total) by mouth at bedtime. Take half a  tablet once a day for one week and then stop it   OMEPRAZOLE (PRILOSEC) 20 MG CAPSULE    Take 1 capsule (20 mg total) by mouth daily.   TIMOLOL MALEATE 0.5 % (DAILY) SOLN    Place 1 drop into both eyes daily.    TOPIRAMATE (TOPAMAX) 25 MG TABLET    Take 1 tablet (25 mg total) by mouth 2 (two) times daily.   TRAMADOL (ULTRAM) 50 MG TABLET    Take one tablet by mouth once daily for pain  Modified Medications   No medications on file  Discontinued Medications   No medications on file     Review of Systems  Constitutional: Negative for fever and chills.  Respiratory: Negative for cough, chest tightness and shortness of breath.   Cardiovascular: Positive for chest pain.  Skin: Negative for rash.  Neurological: Negative for dizziness, tremors, facial asymmetry, speech difficulty, weakness and numbness.  Psychiatric/Behavioral: Positive for confusion and sleep disturbance. The patient is nervous/anxious.     Filed Vitals:   06/06/14 1145  BP: 110/80  Pulse: 61  Temp: 97.5 F (36.4 C)  TempSrc: Oral  Resp:  18  Height: 5\' 3"  (1.6 m)  Weight: 101 lb (45.813 kg)  SpO2: 99%   Body mass index is 17.9 kg/(m^2).  Physical Exam  Constitutional:  Frail appearing in NAD  Pulmonary/Chest:    Abdominal: Soft. Normal appearance, normal aorta and bowel sounds are normal. She exhibits no distension, no fluid wave, no abdominal bruit, no ascites, no pulsatile midline mass and no mass. There is no hepatomegaly. There is no tenderness. There is no rigidity, no rebound and no guarding.  Skin:        Labs reviewed: Office Visit on 06/06/2014  Component Date Value Ref Range Status  . Color, UA 06/06/2014 Yellow   Final  . Clarity, UA 06/06/2014 clear   Final  . Glucose, UA 06/06/2014 negative   Final  . Bilirubin, UA 06/06/2014 negative   Final  . Ketones, UA 06/06/2014 negative   Final  . Spec Grav, UA 06/06/2014 <=1.005   Final  . Blood, UA 06/06/2014 negative   Final  . pH, UA  06/06/2014 8.5   Final  . Protein, UA 06/06/2014 negative   Final  . Urobilinogen, UA 06/06/2014 negative   Final  . Nitrite, UA 06/06/2014 negative   Final  . Leukocytes, UA 06/06/2014 small (1+)   Final   UA neg for acute process  Assessment/Plan   ICD-9-CM ICD-10-CM   1. Shingles (herpes zoster) polyneuropathy - new 053.13 B02.23 gabapentin (NEURONTIN) 100 MG capsule  2. Dermoid cyst of scalp - new 216.4 D23.4   3. OAB (overactive bladder) - unchanged on myrbetriq 596.51 N32.81 Ambulatory referral to Urology     POC Urinalysis Dipstick   --trial of gabapentin 100mg  qhs for neuropathic pain  --may apply cool compress to chest wall as needed  --wash hands frequently to avoid spread  --antiviral not indicated as the window for treatment has expired  --she will think about derm referral for scalp cyst. She was told by her hairstylist that the lump is enlarging  --refer to urology for OAB w/u. She has failed pharmacologic tx  --f/u for routine OV   Nakita Santerre S. Perlie Gold  [facility] [address] [place], [place] [address] [phone] Office (Wednesdays and Fridays 8 AM - 5 PM) [phone] Cell (Monday-Friday 8 AM - 5 PM)

## 2014-06-06 NOTE — Patient Instructions (Addendum)
Continue current medications as prescribed.  Will call with appt for Urology  Recommend dermatology eval for scalp cyst if it becomes irritated

## 2014-06-11 ENCOUNTER — Ambulatory Visit (INDEPENDENT_AMBULATORY_CARE_PROVIDER_SITE_OTHER): Payer: Medicare Other | Admitting: Internal Medicine

## 2014-06-11 ENCOUNTER — Encounter: Payer: Self-pay | Admitting: Internal Medicine

## 2014-06-11 VITALS — BP 118/66 | HR 62 | Temp 97.7°F | Resp 20 | Ht 63.0 in | Wt 104.6 lb

## 2014-06-11 DIAGNOSIS — M25442 Effusion, left hand: Secondary | ICD-10-CM

## 2014-06-11 DIAGNOSIS — B0223 Postherpetic polyneuropathy: Secondary | ICD-10-CM

## 2014-06-11 DIAGNOSIS — M150 Primary generalized (osteo)arthritis: Secondary | ICD-10-CM | POA: Diagnosis not present

## 2014-06-11 DIAGNOSIS — R410 Disorientation, unspecified: Secondary | ICD-10-CM

## 2014-06-11 DIAGNOSIS — M159 Polyosteoarthritis, unspecified: Secondary | ICD-10-CM

## 2014-06-11 NOTE — Progress Notes (Signed)
Patient ID: Carrie Moran, female   DOB: [birth year], 81 y.o.   MRN: [id number]    Facility  [facility]    Place of Service:   OFFICE   Allergies  Allergen Reactions  . Sulfa Antibiotics     Chief Complaint  Patient presents with  . Acute Visit    Patient with left hand concerns,swollen joint. No known injury   . Medication Management    Discuss ,gabapentin    HPI:  81 yo female seen today for left finger pain x 3 days. 3rd finger goes out of joint and hurts. No known trauma. No numbness or tingling.  Son would [facility]. She has fallen x 2 in last 4 mos while getting dressed. She also needs help with bathing and medication adherance. No other ADL needs.   Since beginning the gabapentin, she has been disoriented and confuses night and day. No other concerns. Med is controlling her pain and son does not wish to change med at this time.  Medications: Patient's Medications  New Prescriptions   No medications on file  Previous Medications   AMBULATORY NON FORMULARY MEDICATION    Ted compression stockings for both legs- wear them in the morning and remove it before going to bed   AMLODIPINE (NORVASC) 10 MG TABLET    Take one tablet once daily to control Blood pressure   ASPIRIN 81 MG TABLET    Take 81 mg by mouth daily.   CILOSTAZOL (PLETAL) 50 MG TABLET    Take 50 mg by mouth 2 (two) times daily.   CLONIDINE (CATAPRES) 0.1 MG TABLET    Take 1 tablet (0.1 mg total) by mouth 2 (two) times daily.   DICLOFENAC SODIUM (VOLTAREN) 1 % GEL    Apply 2 g topically 2 (two) times daily.   GABAPENTIN (NEURONTIN) 100 MG CAPSULE    Take 1 capsule (100 mg total) by mouth at bedtime.   LATANOPROST (XALATAN) 0.005 % OPHTHALMIC SOLUTION    Place 1 drop into both eyes daily. Instill 1 drop in each eye once daily for glaucoma.   METOPROLOL TARTRATE (LOPRESSOR) 25 MG TABLET    Take 1 tablet (25 mg total) by mouth 2 (two) times daily.   MIRABEGRON ER (MYRBETRIQ) 25 MG TB24 TABLET     Take 1 tablet (25 mg total) by mouth daily.   MIRTAZAPINE (REMERON) 15 MG TABLET    Take 0.5 tablets (7.5 mg total) by mouth at bedtime. Take half a tablet once a day for one week and then stop it   OMEPRAZOLE (PRILOSEC) 20 MG CAPSULE    Take 1 capsule (20 mg total) by mouth daily.   TIMOLOL MALEATE 0.5 % (DAILY) SOLN    Place 1 drop into both eyes daily.    TOPIRAMATE (TOPAMAX) 25 MG TABLET    Take 1 tablet (25 mg total) by mouth 2 (two) times daily.   TRAMADOL (ULTRAM) 50 MG TABLET    Take one tablet by mouth once daily for pain  Modified Medications   No medications on file  Discontinued Medications   No medications on file     Review of Systems  Unable to perform ROS: Other  she is confused  Filed Vitals:   06/11/14 0913  BP: 118/66  Pulse: 62  Temp: 97.7 F (36.5 C)  TempSrc: Oral  Resp: 20  Height: 5\' 3"  (1.6 m)  Weight: 104 lb 9.6 oz (47.446 kg)  SpO2: 97%  Body mass index is 18.53 kg/(m^2).  Physical Exam  Constitutional: No distress.  Frail appearing in NAD. Awake and alert  Musculoskeletal: She exhibits edema and tenderness (with flexion of left 3rd finger at MCP joint).  Left 2nd MCP joint swelling with crepitus. ROM intact. Min PIP and no DIP swelling at 3rd finger. Grip strengthintact. Radial and ulnar pulses intact. Excellent capillary RF  Psychiatric: Her speech is normal. Her mood appears not anxious. She is not agitated.     Labs reviewed: Office Visit on 06/06/2014  Component Date Value Ref Range Status  . Color, UA 06/06/2014 Yellow   Final  . Clarity, UA 06/06/2014 clear   Final  . Glucose, UA 06/06/2014 negative   Final  . Bilirubin, UA 06/06/2014 negative   Final  . Ketones, UA 06/06/2014 negative   Final  . Spec Grav, UA 06/06/2014 <=1.005   Final  . Blood, UA 06/06/2014 negative   Final  . pH, UA 06/06/2014 8.5   Final  . Protein, UA 06/06/2014 negative   Final  . Urobilinogen, UA 06/06/2014 negative   Final  . Nitrite, UA 06/06/2014  negative   Final  . Leukocytes, UA 06/06/2014 small (1+)   Final     Assessment/Plan   ICD-9-CM ICD-10-CM   1. Finger joint swelling, left - probably due to arthritis 719.04 M25.442   2. Shingles (herpes zoster) polyneuropathy 053.13 B02.23   3. Disorientation 780.99 R41.0    due to gabapentin  4.      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 May benefit from joint injection. Buddy tape fingers until seen by Ortho. Avoid use for now until seen by Ortho  --continue tramadol prn pain  --continue gabapentin for shingles pain. Redirect when disoriented. Watch as she is a fall risk  --recommend [facility] due to her increased ADL needs. She needs assistance with dressing, bathing and medication adherence.  --Follow up as scheduled and prn  Deidra Spease S. Perlie Gold  [facility] [address] [place], [place] [address] [phone] Office (Wednesdays and Fridays 8 AM - 5 PM) [phone] Cell (Monday-Friday 8 AM - 5 PM)

## 2014-06-11 NOTE — Patient Instructions (Signed)
Recommend Ortho eval (hand specialist) for possible injection  Continue gabapentin for nerve pain  Keep appt as scheduled

## 2014-06-12 ENCOUNTER — Encounter: Payer: Self-pay | Admitting: Internal Medicine

## 2014-06-17 ENCOUNTER — Encounter (HOSPITAL_COMMUNITY): Payer: Self-pay | Admitting: Emergency Medicine

## 2014-06-17 ENCOUNTER — Other Ambulatory Visit (HOSPITAL_COMMUNITY): Payer: Self-pay

## 2014-06-17 ENCOUNTER — Emergency Department (HOSPITAL_COMMUNITY): Payer: Medicare Other

## 2014-06-17 ENCOUNTER — Inpatient Hospital Stay (HOSPITAL_COMMUNITY)
Admission: EM | Admit: 2014-06-17 | Discharge: 2014-06-24 | DRG: 469 | Disposition: A | Discharge status: Skilled Nursing Facility | Payer: Medicare Other | Attending: Internal Medicine | Admitting: Internal Medicine

## 2014-06-17 ENCOUNTER — Inpatient Hospital Stay (HOSPITAL_COMMUNITY): Payer: Medicare Other

## 2014-06-17 DIAGNOSIS — R0602 Shortness of breath: Secondary | ICD-10-CM

## 2014-06-17 DIAGNOSIS — M810 Age-related osteoporosis without current pathological fracture: Secondary | ICD-10-CM | POA: Diagnosis present

## 2014-06-17 DIAGNOSIS — S72001A Fracture of unspecified part of neck of right femur, initial encounter for closed fracture: Principal | ICD-10-CM | POA: Diagnosis present

## 2014-06-17 DIAGNOSIS — F0390 Unspecified dementia without behavioral disturbance: Secondary | ICD-10-CM | POA: Diagnosis present

## 2014-06-17 DIAGNOSIS — J189 Pneumonia, unspecified organism: Secondary | ICD-10-CM | POA: Diagnosis not present

## 2014-06-17 DIAGNOSIS — Z01810 Encounter for preprocedural cardiovascular examination: Secondary | ICD-10-CM

## 2014-06-17 DIAGNOSIS — G2581 Restless legs syndrome: Secondary | ICD-10-CM | POA: Diagnosis present

## 2014-06-17 DIAGNOSIS — I10 Essential (primary) hypertension: Secondary | ICD-10-CM | POA: Diagnosis present

## 2014-06-17 DIAGNOSIS — R7989 Other specified abnormal findings of blood chemistry: Secondary | ICD-10-CM

## 2014-06-17 DIAGNOSIS — H409 Unspecified glaucoma: Secondary | ICD-10-CM | POA: Diagnosis present

## 2014-06-17 DIAGNOSIS — R778 Other specified abnormalities of plasma proteins: Secondary | ICD-10-CM | POA: Diagnosis present

## 2014-06-17 DIAGNOSIS — Z87891 Personal history of nicotine dependence: Secondary | ICD-10-CM | POA: Diagnosis not present

## 2014-06-17 DIAGNOSIS — I739 Peripheral vascular disease, unspecified: Secondary | ICD-10-CM | POA: Diagnosis present

## 2014-06-17 DIAGNOSIS — Z791 Long term (current) use of non-steroidal anti-inflammatories (NSAID): Secondary | ICD-10-CM

## 2014-06-17 DIAGNOSIS — K5900 Constipation, unspecified: Secondary | ICD-10-CM | POA: Diagnosis present

## 2014-06-17 DIAGNOSIS — I4891 Unspecified atrial fibrillation: Secondary | ICD-10-CM

## 2014-06-17 DIAGNOSIS — I248 Other forms of acute ischemic heart disease: Secondary | ICD-10-CM | POA: Diagnosis present

## 2014-06-17 DIAGNOSIS — Y95 Nosocomial condition: Secondary | ICD-10-CM | POA: Diagnosis not present

## 2014-06-17 DIAGNOSIS — N390 Urinary tract infection, site not specified: Secondary | ICD-10-CM

## 2014-06-17 DIAGNOSIS — Z7982 Long term (current) use of aspirin: Secondary | ICD-10-CM

## 2014-06-17 DIAGNOSIS — K219 Gastro-esophageal reflux disease without esophagitis: Secondary | ICD-10-CM | POA: Diagnosis present

## 2014-06-17 DIAGNOSIS — S72001D Fracture of unspecified part of neck of right femur, subsequent encounter for closed fracture with routine healing: Secondary | ICD-10-CM

## 2014-06-17 DIAGNOSIS — R7401 Elevation of levels of liver transaminase levels: Secondary | ICD-10-CM | POA: Insufficient documentation

## 2014-06-17 DIAGNOSIS — N183 Chronic kidney disease, stage 3 unspecified: Secondary | ICD-10-CM

## 2014-06-17 DIAGNOSIS — Z79899 Other long term (current) drug therapy: Secondary | ICD-10-CM

## 2014-06-17 DIAGNOSIS — N179 Acute kidney failure, unspecified: Secondary | ICD-10-CM | POA: Diagnosis present

## 2014-06-17 DIAGNOSIS — W19XXXA Unspecified fall, initial encounter: Secondary | ICD-10-CM

## 2014-06-17 DIAGNOSIS — I129 Hypertensive chronic kidney disease with stage 1 through stage 4 chronic kidney disease, or unspecified chronic kidney disease: Secondary | ICD-10-CM | POA: Diagnosis present

## 2014-06-17 DIAGNOSIS — R140 Abdominal distension (gaseous): Secondary | ICD-10-CM

## 2014-06-17 DIAGNOSIS — R079 Chest pain, unspecified: Secondary | ICD-10-CM

## 2014-06-17 DIAGNOSIS — R740 Nonspecific elevation of levels of transaminase and lactic acid dehydrogenase [LDH]: Secondary | ICD-10-CM

## 2014-06-17 DIAGNOSIS — T8351XD Infection and inflammatory reaction due to indwelling urinary catheter, subsequent encounter: Secondary | ICD-10-CM | POA: Diagnosis not present

## 2014-06-17 DIAGNOSIS — N3281 Overactive bladder: Secondary | ICD-10-CM | POA: Diagnosis present

## 2014-06-17 DIAGNOSIS — E870 Hyperosmolality and hypernatremia: Secondary | ICD-10-CM | POA: Diagnosis present

## 2014-06-17 DIAGNOSIS — Z66 Do not resuscitate: Secondary | ICD-10-CM | POA: Diagnosis present

## 2014-06-17 DIAGNOSIS — R109 Unspecified abdominal pain: Secondary | ICD-10-CM

## 2014-06-17 DIAGNOSIS — M25551 Pain in right hip: Secondary | ICD-10-CM | POA: Diagnosis present

## 2014-06-17 DIAGNOSIS — K819 Cholecystitis, unspecified: Secondary | ICD-10-CM

## 2014-06-17 DIAGNOSIS — Z09 Encounter for follow-up examination after completed treatment for conditions other than malignant neoplasm: Secondary | ICD-10-CM

## 2014-06-17 DIAGNOSIS — S72009A Fracture of unspecified part of neck of unspecified femur, initial encounter for closed fracture: Secondary | ICD-10-CM

## 2014-06-17 HISTORY — DX: Peripheral vascular disease, unspecified: I73.9

## 2014-06-17 HISTORY — DX: Essential (primary) hypertension: I10

## 2014-06-17 HISTORY — DX: Fracture of unspecified part of neck of unspecified femur, initial encounter for closed fracture: S72.009A

## 2014-06-17 HISTORY — DX: Unspecified glaucoma: H40.9

## 2014-06-17 HISTORY — DX: Major depressive disorder, single episode, unspecified: F32.9

## 2014-06-17 HISTORY — DX: Chronic kidney disease, stage 3 (moderate): N18.3

## 2014-06-17 HISTORY — DX: Overactive bladder: N32.81

## 2014-06-17 HISTORY — DX: Chronic kidney disease, stage 3 unspecified: N18.30

## 2014-06-17 HISTORY — DX: Primary generalized (osteo)arthritis: M15.0

## 2014-06-17 LAB — TYPE AND SCREEN
ABO/RH(D): A NEG
Antibody Screen: NEGATIVE

## 2014-06-17 LAB — COMPREHENSIVE METABOLIC PANEL
ALT: 244 U/L — ABNORMAL HIGH (ref 0–35)
AST: 362 U/L — AB (ref 0–37)
Albumin: 3.6 g/dL (ref 3.5–5.2)
Alkaline Phosphatase: 78 U/L (ref 39–117)
Anion gap: 11 (ref 5–15)
BUN: 34 mg/dL — ABNORMAL HIGH (ref 6–23)
CALCIUM: 9.1 mg/dL (ref 8.4–10.5)
CO2: 20 mmol/L (ref 19–32)
CREATININE: 1.41 mg/dL — AB (ref 0.50–1.10)
Chloride: 108 mmol/L (ref 96–112)
GFR calc Af Amer: 36 mL/min — ABNORMAL LOW (ref >90)
GFR calc non Af Amer: 31 mL/min — ABNORMAL LOW (ref >90)
Glucose, Bld: 265 mg/dL — ABNORMAL HIGH (ref 70–99)
Potassium: 4 mmol/L (ref 3.5–5.1)
Sodium: 139 mmol/L (ref 135–145)
Total Bilirubin: 1.4 mg/dL — ABNORMAL HIGH (ref 0.3–1.2)
Total Protein: 6.2 g/dL (ref 6.0–8.3)

## 2014-06-17 LAB — CBC WITH DIFFERENTIAL/PLATELET
BASOS PCT: 0 % (ref 0–1)
Basophils Absolute: 0 10*3/uL (ref 0.0–0.1)
Eosinophils Absolute: 0.1 10*3/uL (ref 0.0–0.7)
Eosinophils Relative: 1 % (ref 0–5)
HEMATOCRIT: 45.1 % (ref 36.0–46.0)
HEMOGLOBIN: 13.9 g/dL (ref 12.0–15.0)
LYMPHS ABS: 2.7 10*3/uL (ref 0.7–4.0)
Lymphocytes Relative: 12 % (ref 12–46)
MCH: 30 pg (ref 26.0–34.0)
MCHC: 30.8 g/dL (ref 30.0–36.0)
MCV: 97.2 fL (ref 78.0–100.0)
MONO ABS: 1.6 10*3/uL — AB (ref 0.1–1.0)
MONOS PCT: 7 % (ref 3–12)
NEUTROS ABS: 17.8 10*3/uL — AB (ref 1.7–7.7)
Neutrophils Relative %: 80 % — ABNORMAL HIGH (ref 43–77)
Platelets: 160 10*3/uL (ref 150–400)
RBC: 4.64 MIL/uL (ref 3.87–5.11)
RDW: 13 % (ref 11.5–15.5)
WBC: 22.2 10*3/uL — ABNORMAL HIGH (ref 4.0–10.5)

## 2014-06-17 LAB — I-STAT TROPONIN, ED: Troponin i, poc: 0.49 ng/mL (ref 0.00–0.08)

## 2014-06-17 LAB — URINALYSIS, ROUTINE W REFLEX MICROSCOPIC
BILIRUBIN URINE: NEGATIVE
Glucose, UA: NEGATIVE mg/dL
Hgb urine dipstick: NEGATIVE
Ketones, ur: NEGATIVE mg/dL
Nitrite: NEGATIVE
PROTEIN: NEGATIVE mg/dL
SPECIFIC GRAVITY, URINE: 1.01 (ref 1.005–1.030)
UROBILINOGEN UA: 0.2 mg/dL (ref 0.0–1.0)
pH: 6 (ref 5.0–8.0)

## 2014-06-17 LAB — MRSA PCR SCREENING: MRSA BY PCR: NEGATIVE

## 2014-06-17 LAB — PROTIME-INR
INR: 1.37 (ref 0.00–1.49)
Prothrombin Time: 17 seconds — ABNORMAL HIGH (ref 11.6–15.2)

## 2014-06-17 LAB — URINE MICROSCOPIC-ADD ON

## 2014-06-17 LAB — ABO/RH: ABO/RH(D): A NEG

## 2014-06-17 MED ORDER — ENOXAPARIN SODIUM 40 MG/0.4ML ~~LOC~~ SOLN
40.0000 mg | SUBCUTANEOUS | Status: DC
  Administered 2014-06-17: 40 mg via SUBCUTANEOUS
  Filled 2014-06-17: qty 0.4

## 2014-06-17 MED ORDER — GABAPENTIN 100 MG PO CAPS
100.0000 mg | ORAL_CAPSULE | Freq: Every day | ORAL | Status: DC
  Administered 2014-06-17 – 2014-06-23 (×7): 100 mg via ORAL
  Filled 2014-06-17 (×7): qty 1

## 2014-06-17 MED ORDER — CILOSTAZOL 100 MG PO TABS
50.0000 mg | ORAL_TABLET | Freq: Two times a day (BID) | ORAL | Status: DC
  Administered 2014-06-17 – 2014-06-24 (×14): 50 mg via ORAL
  Filled 2014-06-17 (×14): qty 1

## 2014-06-17 MED ORDER — METOPROLOL TARTRATE 25 MG PO TABS
25.0000 mg | ORAL_TABLET | Freq: Two times a day (BID) | ORAL | Status: DC
  Administered 2014-06-17 – 2014-06-20 (×7): 25 mg via ORAL
  Filled 2014-06-17 (×7): qty 1

## 2014-06-17 MED ORDER — FENTANYL CITRATE 0.05 MG/ML IJ SOLN
50.0000 ug | Freq: Once | INTRAMUSCULAR | Status: AC
  Administered 2014-06-17: 50 ug via INTRAVENOUS
  Filled 2014-06-17: qty 2

## 2014-06-17 MED ORDER — AMLODIPINE BESYLATE 10 MG PO TABS
10.0000 mg | ORAL_TABLET | Freq: Every day | ORAL | Status: DC
  Administered 2014-06-17 – 2014-06-20 (×3): 10 mg via ORAL
  Filled 2014-06-17 (×3): qty 1

## 2014-06-17 MED ORDER — TIMOLOL MALEATE 0.5 % OP SOLN
1.0000 [drp] | Freq: Every day | OPHTHALMIC | Status: DC
  Administered 2014-06-17 – 2014-06-24 (×8): 1 [drp] via OPHTHALMIC
  Filled 2014-06-17: qty 5

## 2014-06-17 MED ORDER — CLONIDINE HCL 0.1 MG PO TABS
0.1000 mg | ORAL_TABLET | Freq: Two times a day (BID) | ORAL | Status: DC
  Administered 2014-06-17 – 2014-06-24 (×14): 0.1 mg via ORAL
  Filled 2014-06-17 (×14): qty 1

## 2014-06-17 MED ORDER — TOPIRAMATE 25 MG PO TABS
25.0000 mg | ORAL_TABLET | Freq: Two times a day (BID) | ORAL | Status: DC
  Administered 2014-06-17 – 2014-06-24 (×13): 25 mg via ORAL
  Filled 2014-06-17 (×16): qty 1

## 2014-06-17 MED ORDER — MORPHINE SULFATE 4 MG/ML IJ SOLN
4.0000 mg | Freq: Once | INTRAMUSCULAR | Status: AC
  Administered 2014-06-17: 4 mg via INTRAVENOUS
  Filled 2014-06-17: qty 1

## 2014-06-17 MED ORDER — SODIUM CHLORIDE 0.9 % IV SOLN
INTRAVENOUS | Status: DC
  Administered 2014-06-17: 14:00:00 via INTRAVENOUS

## 2014-06-17 MED ORDER — PANTOPRAZOLE SODIUM 40 MG PO TBEC
40.0000 mg | DELAYED_RELEASE_TABLET | Freq: Every day | ORAL | Status: DC
  Administered 2014-06-17 – 2014-06-24 (×7): 40 mg via ORAL
  Filled 2014-06-17 (×7): qty 1

## 2014-06-17 MED ORDER — SODIUM CHLORIDE 0.9 % IV SOLN
INTRAVENOUS | Status: DC
  Administered 2014-06-18 – 2014-06-21 (×5): via INTRAVENOUS

## 2014-06-17 MED ORDER — LATANOPROST 0.005 % OP SOLN
1.0000 [drp] | Freq: Every day | OPHTHALMIC | Status: DC
Start: 2014-06-17 — End: 2014-06-24
  Administered 2014-06-17 – 2014-06-24 (×8): 1 [drp] via OPHTHALMIC
  Filled 2014-06-17: qty 2.5

## 2014-06-17 MED ORDER — POLYETHYLENE GLYCOL 3350 17 G PO PACK
17.0000 g | PACK | Freq: Every day | ORAL | Status: DC
  Administered 2014-06-17 – 2014-06-24 (×7): 17 g via ORAL
  Filled 2014-06-17 (×6): qty 1

## 2014-06-17 MED ORDER — MIRABEGRON ER 25 MG PO TB24
25.0000 mg | ORAL_TABLET | Freq: Every day | ORAL | Status: DC
  Administered 2014-06-17 – 2014-06-24 (×7): 25 mg via ORAL
  Filled 2014-06-17 (×8): qty 1

## 2014-06-17 MED ORDER — SENNOSIDES-DOCUSATE SODIUM 8.6-50 MG PO TABS: 2.0000 | ORAL_TABLET | Freq: Every day | ORAL | Status: DC | PRN

## 2014-06-17 MED ORDER — SODIUM CHLORIDE 0.9 % IV BOLUS (SEPSIS)
500.0000 mL | Freq: Once | INTRAVENOUS | Status: AC
  Administered 2014-06-17: 500 mL via INTRAVENOUS

## 2014-06-17 MED ORDER — DICLOFENAC SODIUM 1 % TD GEL
2.0000 g | Freq: Two times a day (BID) | TRANSDERMAL | Status: DC
  Administered 2014-06-17 – 2014-06-19 (×3): 2 g via TOPICAL
  Filled 2014-06-17: qty 100

## 2014-06-17 NOTE — Progress Notes (Signed)
  Echocardiogram 2D Echocardiogram has been performed.  Darlina Sicilian M 06/17/2014, 1:46 PM

## 2014-06-17 NOTE — Progress Notes (Signed)
Clinical Social Work Department CLINICAL SOCIAL WORK PLACEMENT NOTE 06/17/2014  Patient:  Carrie Moran, Carrie Moran  Account Number:  [id number] Admit date:  06/17/2014  Clinical Social Worker:  Renold Genta  Date/time:  06/17/2014 04:16 PM  Clinical Social Work is seeking post-discharge placement for this patient at the following level of care:   SKILLED NURSING   (*CSW will update this form in Epic as items are completed)   06/17/2014  Patient/family provided with [place] Department of Clinical Social Work's list of facilities offering this level of care within the geographic area requested by the patient (or if unable, by the patient's family).  06/17/2014  Patient/family informed of their freedom to choose among providers that offer the needed level of care, that participate in Medicare, Medicaid or managed care program needed by the patient, have an available bed and are willing to accept the patient.  06/17/2014  Patient/family informed of [facility]' ownership interest in [facility], as well as of the fact that they are under no obligation to receive care at this facility.  PASARR submitted to EDS on 06/17/2014 PASARR number received on 06/17/2014  FL2 transmitted to all facilities in geographic area requested by pt/family on  06/17/2014 FL2 transmitted to all facilities within larger geographic area on   Patient informed that his/her managed care company has contracts with or will negotiate with  certain facilities, including the following:     Patient/family informed of bed offers received:   Patient chooses bed at  Physician recommends and patient chooses bed at    Patient to be transferred to  on   Patient to be transferred to facility by  Patient and family notified of transfer on  Name of family member notified:    The following physician request were entered in Epic:   Additional Comments:    Raynaldo Opitz, [place] Social Worker cell #: [phone]

## 2014-06-17 NOTE — ED Notes (Signed)
Critical value I-stat CTnl 0.49 reported to Dr. Darl Householder

## 2014-06-17 NOTE — H&P (Addendum)
History and Physical:    Carrie Moran   ZOX:[id number] DOB: 1917/10/05 DOA: 06/17/2014  Referring physician: Dr. Shirlyn Moran PCP: Carrie Serve, MD   Chief Complaint: Fall/hip pain  History of Present Illness:   Carrie Moran is an 81 y.o. female with a PMH of dementia, hypertension and severe LVH with an EF of 65% by echocardiography in 2011 who presented with a fall and right hip pain. No angina. 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 She denies any syncope or loss of consciousness. X-rays show a right subcapital hip fracture. As part of her workup her troponin resulted as positive at 0.49. White blood cell count is elevated at 22,000 with a left shift. UA is negative and chest x-ray is clear. There is no known history of coronary disease.  Cardiology has seen the patient for pre-operative clearance given her elevated troponins, with recommendations to proceed with 2 D Echo but to proceed with surgery after this is completed as further preop testing will delay her surgery and recovery.  Patient has received pain medications and is unable to participate in an extensive reiteration of her history of present illness. Her son reports that she currently resides in an assisted living facility, where she was brought to live due to frequent falls at home. On a previous fall, she actually broke her collarbone.  ROS:   Constitutional: No fever, no chills;  Appetite normal; + weight loss, no weight gain, no fatigue.  HEENT: No blurry vision, no diplopia, no pharyngitis, no dysphagia CV: No chest pain, no palpitations, no PND, no orthopnea, no edema.  Resp: No SOB, no cough, no pleuritic pain. GI: No nausea, no vomiting, no diarrhea, no melena, no hematochezia, + constipation, no abdominal pain.  GU: No dysuria, no hematuria, no frequency, no urgency. MSK: no myalgias, no arthralgias, +right hip pain, +gait imbalance.  Neuro:  No headache, no  focal neurological deficits, no history of seizures.  Psych: + depression, no anxiety.  Endo: No heat intolerance, no cold intolerance, no polyuria, no polydipsia  Skin: No rashes, no skin lesions.  Heme: No easy bruising.  Travel history: No recent travel.   Past Medical History:   Past Medical History  Diagnosis Date  . Hypertension   . Memory loss   . GERD (gastroesophageal reflux disease)   . RLS (restless legs syndrome)   . Hypertonicity of bladder   . Renal insufficiency   . Other headache syndromes(339.89)   . Osteoporosis, unspecified   . Urinary frequency   . Accelerated hypertension 07/09/2013  . Stage III chronic kidney disease 06/17/2014  . Hip fracture requiring operative repair 06/17/2014  . Primary osteoarthritis involving multiple joints 09/24/2013  . PVD (peripheral vascular disease) 11/07/2012  . Glaucoma 11/07/2012  . Depression 11/07/2012  . Overactive bladder 10/17/2012    Past Surgical History:   Past Surgical History  Procedure Laterality Date  . Appendectomy  1930  . Rotator cuff repair  1986  . Rotator cuff repair  2001  . Carpal tunnel release  2006    Bilateral     Social History:   History   Social History  . Marital Status: Widowed    Spouse Name: N/A  . Number of Children: N/A  . Years of Education: N/A   Occupational History  . Not on file.   Social History Main Topics  . Smoking status: Former Research scientist (life sciences)  . Smokeless tobacco: Never  Used  . Alcohol Use: No  . Drug Use: No  . Sexual Activity: Not on file   Other Topics Concern  . Not on file   Social History Narrative   Lives at [facility]   Widowed   Former smoker   Alcohol none   Exercise does chair exercise once a day 5 days a week             Family history:   Family History  Problem Relation Age of Onset  . Heart attack Mother   . Cancer Brother   . Cancer Brother     Allergies   Sulfa antibiotics  Current Medications:   Prior to Admission medications    Medication Sig Start Date End Date Taking? Authorizing Provider  amLODipine (NORVASC) 10 MG tablet Take one tablet once daily to control Blood pressure   Yes Historical Provider, MD  aspirin 81 MG tablet Take 81 mg by mouth daily.   Yes Historical Provider, MD  cilostazol (PLETAL) 50 MG tablet Take 50 mg by mouth 2 (two) times daily.   Yes Historical Provider, MD  cloNIDine (CATAPRES) 0.1 MG tablet Take 1 tablet (0.1 mg total) by mouth 2 (two) times daily. 10/24/12  Yes Mahima Bubba Camp, MD  diclofenac sodium (VOLTAREN) 1 % GEL Apply 2 g topically 2 (two) times daily. 09/24/13  Yes Mahima Bubba Camp, MD  gabapentin (NEURONTIN) 100 MG capsule Take 1 capsule (100 mg total) by mouth at bedtime. 06/06/14  Yes Monica Carter, DO  latanoprost (XALATAN) 0.005 % ophthalmic solution Place 1 drop into both eyes daily. Instill 1 drop in each eye once daily for glaucoma.   Yes Historical Provider, MD  metoprolol tartrate (LOPRESSOR) 25 MG tablet Take 1 tablet (25 mg total) by mouth 2 (two) times daily. 07/09/13  Yes Mahima Pandey, MD  mirabegron ER (MYRBETRIQ) 25 MG TB24 tablet Take 1 tablet (25 mg total) by mouth daily. 09/24/13  Yes Mahima Bubba Camp, MD  omeprazole (PRILOSEC) 20 MG capsule Take 1 capsule (20 mg total) by mouth daily. 09/24/13  Yes Mahima Pandey, MD  senna-docusate (SENOKOT-S) 8.6-50 MG per tablet Take 2 tablets by mouth daily as needed for mild constipation.   Yes Historical Provider, MD  Timolol Maleate 0.5 % (DAILY) SOLN Place 1 drop into both eyes daily.    Yes Historical Provider, MD  topiramate (TOPAMAX) 25 MG tablet Take 1 tablet (25 mg total) by mouth 2 (two) times daily. 11/07/12  Yes Carrie Serve, MD  traMADol (ULTRAM) 50 MG tablet Take one tablet by mouth once daily for pain 04/17/14  Yes Lauree Chandler, NP  AMBULATORY NON FORMULARY MEDICATION Ted compression stockings for both legs- wear them in the morning and remove it before going to bed    Historical Provider, MD  mirtazapine (REMERON) 15 MG  tablet Take 0.5 tablets (7.5 mg total) by mouth at bedtime. Take half a tablet once a day for one week and then stop it Patient not taking: Reported on 06/17/2014 11/26/13   Carrie Serve, MD    Physical Exam:   Filed Vitals:   06/17/14 1104 06/17/14 1219 06/17/14 1300 06/17/14 1458  BP: 118/72  114/73   Pulse:  71 70   Temp:    97.5 F (36.4 C)  TempSrc:    Axillary  Resp: _0 Height:   5' 1" (1.549 m)   Weight:   48.1 kg (106 lb 0.7 oz)   SpO2: 99% 100% 95%  Physical Exam: Blood pressure 114/73, pulse 70, temperature 97.5 F (36.4 C), temperature source Axillary, resp. rate 18, height 5' 1" (1.549 m), weight 48.1 kg (106 lb 0.7 oz), SpO2 95 %. Gen: No acute distress. Head: Normocephalic, atraumatic. Eyes: PERRL, EOMI, sclerae nonicteric. Mouth: Oropharynx clear with dry mucous membranes. Neck: Supple, no thyromegaly, no lymphadenopathy, no jugular venous distention. Chest: Lungs diminished but clear. CV: Heart sounds are regular with a few premature beats noted. Grade 2/6 systolic murmur. Abdomen: Soft, distended but nontender with normal active bowel sounds. Extremities: Extremities are without clubbing, edema, or cyanosis. Skin: Warm and dry. Neuro: Alert and oriented times 2; cranial nerves II through XII grossly intact. Psych: Mood and affect normal.   Data Review:    Labs: Basic Metabolic Panel:  Recent Labs Lab 06/17/14 0750  NA 139  K 4.0  CL 108  CO2 20  GLUCOSE 265*  BUN 34*  CREATININE 1.41*  CALCIUM 9.1   Liver Function Tests:  Recent Labs Lab 06/17/14 0750  AST 362*  ALT 244*  ALKPHOS 78  BILITOT 1.4*  PROT 6.2  ALBUMIN 3.6   CBC:  Recent Labs Lab 06/17/14 0750  WBC 22.2*  NEUTROABS 17.8*  HGB 13.9  HCT 45.1  MCV 97.2  PLT 160    Radiographic Studies: Dg Chest 1 View  06/17/2014   CLINICAL DATA:  Pain following fall  EXAM: CHEST  1 VIEW  COMPARISON:  October 12, 2013  FINDINGS: There is no edema or consolidation.  Heart is slightly enlarged with pulmonary vascularity within normal limits. No adenopathy. There is atherosclerotic change in the aorta. No pneumothorax. No acute fracture. There is extensive arthropathy in each shoulder. There is evidence of old trauma involving the left third rib.  IMPRESSION: No edema or consolidation. No change in cardiac silhouette. No pneumothorax.   Electronically Signed   By: Lowella Grip III M.D.   On: 06/17/2014 08:09   Dg Hip Unilat With Pelvis 2-3 Views Right  06/17/2014   CLINICAL DATA:  Fall this morning with right hip pain and lower extremity foreshortening. Initial encounter.  EXAM: RIGHT HIP (WITH PELVIS) 2-3 VIEWS  COMPARISON:  None.  FINDINGS: There is evidence of an acute, complete subcapital fracture of the proximal femur with mild displacement. No dislocation. No bony lesions are seen. The bony pelvis is intact.  IMPRESSION: Acute subcapital fracture of the right hip.   Electronically Signed   By: Aletta Edouard M.D.   On: 06/17/2014 08:07   Dg Femur, Min 2 Views Right  06/17/2014   CLINICAL DATA:  Recent fall with known hip fracture  EXAM: RIGHT FEMUR 2 VIEWS  COMPARISON:  None.  FINDINGS: Subcapital femoral neck fracture is noted with some impaction at the fracture site. Degenerative changes about the knee joint are seen. Diffuse vascular calcifications are noted. No other fractures are seen.  IMPRESSION: Subcapital right femoral neck fracture. No other acute abnormality is seen.   Electronically Signed   By: Inez Catalina M.D.   On: 06/17/2014 12:09    EKG: Independently reviewed. Sinus rhythm; Atrial premature complex; LAD, consider left anterior fascicular block; Anteroseptal infarct as evidenced by Q waves.   Assessment/Plan:   Principal Problem:   Hip fracture, right, requiring operative repair Preoperative risk assessment / clearance  No angina, dyspnea, syncope, and palpitations, history of heart disease including ischemic, valvular, or  myopathic disease, no history of diabetes, or cerebrovascular disease. She does have a history of hypertension, chronic kidney disease  and peripheral artery disease.   Cardiac functional status :  1 MET (Can take care of self, such as eat, dress, or use the toilet (1 MET)  Preoperative ECG reviewed and shows possible prior ischemic event.  Anteroseptal Q waves noted.  She is at moderate risk for perioperative MI given her elevated troponins, abnormal EKG, and comorbidities, but may wish to proceed with surgical intervention despite known risks to preserve her mobility.  Cardiology following.  Surgery tentatively planned for 06/19/14.  Active Problems:   Leukocytosis / transaminitis  Chest x-ray clear. Urinalysis negative for nitrites with small leukocytes and negative bacteria.  Transaminitis appreciated. With abdominal distention, may need a CT scan but would hydrate first in order to protect kidney function.    We'll get an abdominal ultrasound.     Constipation / Abdominal distention  Check KUB, likely constipation related.  Start MiraLAX. Senokot as needed.    Essential hypertension, benign  Continue Norvasc, metoprolol and clonidine.    Glaucoma   Continue Xalatan.    Elevated troponin, probable demand ischemia  Cardiology following.  2-D echo shows EF 65-70 percent with grade 1 diastolic dysfunction. No significant changes from prior 2-D echo, supporting a diagnosis of demand ischemia. Given this, we'll hold aspirin in anticipation of surgery.     Peripheral vascular disease   Continue Pletal.    Stage III chronic kidney disease   Creatinine 1.2-1.6 at baseline. Current creatinine 1.4.    DVT prophylaxis  Lovenox ordered.   Code Status: DNR. Family Communication: Son at the bedside.  Disposition Plan: Home when stable.  Time spent: one hour.   , [facility] Pager [phone] Cell: [phone]   If 7PM-7AM, please contact  night-coverage [email or web address] Password [facility] 06/17/2014, 5:57 PM

## 2014-06-17 NOTE — ED Notes (Signed)
Bed: QZ30 Expected date:  Expected time:  Means of arrival:  Comments: EMS 81yo F, rt hip pain with shortening and rotation

## 2014-06-17 NOTE — Consult Note (Signed)
CONSULTATION NOTE  Reason for Consult: Elevated tropoinin  Requesting Physician: Dr. Darl Householder  Cardiologist: None (NEW)  HPI: This is a 81 y.o. female with a past medical history significant for hypertension and severe LVH with an EF of 65% by echocardiography in 2011. She also has senile dementia. She presented with a fall and right hip pain. She completely denies any angina. This is apparently a mechanical fall. 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 She denies any syncope or loss of consciousness.  X-rays show a right subcapital hip fracture. As part of her workup her troponin resulted as positive at 0.49. White blood cell count is elevated at 22,000 with a left shift.  UA is negative and chest x-ray is clear. There is no known history of coronary disease. Cardiology is asked to comment on her elevated troponin and preoperative risk.  PMHx:  Past Medical History  Diagnosis Date  . Hypertension   . Memory loss   . GERD (gastroesophageal reflux disease)   . RLS (restless legs syndrome)   . Hypertonicity of bladder   . Renal insufficiency   . Other headache syndromes(339.89)   . Osteoporosis, unspecified   . Urinary frequency    Past Surgical History  Procedure Laterality Date  . Appendectomy  1930  . Rotator cuff repair  1986  . Rotator cuff repair  2001  . Carpal tunnel release  2006    Bilateral     FAMHx: Family History  Problem Relation Age of Onset  . Heart attack Mother   . Cancer Brother   . Cancer Brother     SOCHx:  reports that she has quit smoking. She has never used smokeless tobacco. She reports that she does not drink alcohol or use illicit drugs.  ALLERGIES: Allergies  Allergen Reactions  . Sulfa Antibiotics     Unknown reaction.     ROS: A comprehensive review of systems was negative except for: Musculoskeletal: positive for hip pain  HOME MEDICATIONS:   Medication List    ASK  your doctor about these medications        AMBULATORY NON FORMULARY MEDICATION  Ted compression stockings for both legs- wear them in the morning and remove it before going to bed     amLODipine 10 MG tablet  Commonly known as:  NORVASC  Take one tablet once daily to control Blood pressure     aspirin 81 MG tablet  Take 81 mg by mouth daily.     cilostazol 50 MG tablet  Commonly known as:  PLETAL  Take 50 mg by mouth 2 (two) times daily.     cloNIDine 0.1 MG tablet  Commonly known as:  CATAPRES  Take 1 tablet (0.1 mg total) by mouth 2 (two) times daily.     diclofenac sodium 1 % Gel  Commonly known as:  VOLTAREN  Apply 2 g topically 2 (two) times daily.     gabapentin 100 MG capsule  Commonly known as:  NEURONTIN  Take 1 capsule (100 mg total) by mouth at bedtime.     latanoprost 0.005 % ophthalmic solution  Commonly known as:  XALATAN  Place 1 drop into both eyes daily. Instill 1 drop in each eye once daily for glaucoma.     metoprolol tartrate 25 MG tablet  Commonly known as:  LOPRESSOR  Take 1 tablet (25 mg total) by mouth 2 (two) times daily.     mirabegron  ER 25 MG Tb24 tablet  Commonly known as:  MYRBETRIQ  Take 1 tablet (25 mg total) by mouth daily.     mirtazapine 15 MG tablet  Commonly known as:  REMERON  Take 0.5 tablets (7.5 mg total) by mouth at bedtime. Take half a tablet once a day for one week and then stop it     omeprazole 20 MG capsule  Commonly known as:  PRILOSEC  Take 1 capsule (20 mg total) by mouth daily.     senna-docusate 8.6-50 MG per tablet  Commonly known as:  Senokot-S  Take 2 tablets by mouth daily as needed for mild constipation.     Timolol Maleate 0.5 % (DAILY) Soln  Place 1 drop into both eyes daily.     topiramate 25 MG tablet  Commonly known as:  TOPAMAX  Take 1 tablet (25 mg total) by mouth 2 (two) times daily.     traMADol 50 MG tablet  Commonly known as:  ULTRAM  Take one tablet by mouth once daily for pain         HOSPITAL MEDICATIONS: I have reviewed the patient's current medications.  VITALS: Blood pressure 118/72, pulse 61, temperature 97.8 F (36.6 C), temperature source Oral, resp. rate 16, SpO2 99 %.  PHYSICAL EXAM: General appearance: alert, no distress and Lying supine, does not appear in significant pain, hard of hearing Neck: no carotid bruit and no JVD Lungs: clear to auscultation bilaterally Heart: regular rate and rhythm, S1, S2 normal, no murmur, click, rub or gallop Abdomen: soft, non-tender; bowel sounds normal; no masses,  no organomegaly Extremities: extremities normal, atraumatic, no cyanosis or edema Pulses: 2+ and symmetric Skin: Skin color, texture, turgor normal. No rashes or lesions Neurologic: Grossly normal Psych: Pleasant  LABS: Results for orders placed or performed during the hospital encounter of 06/17/14 (from the past 48 hour(s))  CBC with Differential     Status: Abnormal   Collection Time: 06/17/14  7:50 AM  Result Value Ref Range   WBC 22.2 (H) 4.0 - 10.5 K/uL   RBC 4.64 3.87 - 5.11 MIL/uL   Hemoglobin 13.9 12.0 - 15.0 g/dL   HCT 45.1 36.0 - 46.0 %   MCV 97.2 78.0 - 100.0 fL   MCH 30.0 26.0 - 34.0 pg   MCHC 30.8 30.0 - 36.0 g/dL   RDW 13.0 11.5 - 15.5 %   Platelets 160 150 - 400 K/uL   Neutrophils Relative % 80 (H) 43 - 77 %   Neutro Abs 17.8 (H) 1.7 - 7.7 K/uL   Lymphocytes Relative 12 12 - 46 %   Lymphs Abs 2.7 0.7 - 4.0 K/uL   Monocytes Relative 7 3 - 12 %   Monocytes Absolute 1.6 (H) 0.1 - 1.0 K/uL   Eosinophils Relative 1 0 - 5 %   Eosinophils Absolute 0.1 0.0 - 0.7 K/uL   Basophils Relative 0 0 - 1 %   Basophils Absolute 0.0 0.0 - 0.1 K/uL  Comprehensive metabolic panel     Status: Abnormal   Collection Time: 06/17/14  7:50 AM  Result Value Ref Range   Sodium 139 135 - 145 mmol/L   Potassium 4.0 3.5 - 5.1 mmol/L   Chloride 108 96 - 112 mmol/L   CO2 20 19 - 32 mmol/L   Glucose, Bld 265 (H) 70 - 99 mg/dL   BUN 34 (H) 6 - 23 mg/dL    Creatinine, Ser 1.41 (H) 0.50 - 1.10 mg/dL   Calcium 9.1  8.4 - 10.5 mg/dL   Total Protein 6.2 6.0 - 8.3 g/dL   Albumin 3.6 3.5 - 5.2 g/dL   AST 362 (H) 0 - 37 U/L   ALT 244 (H) 0 - 35 U/L   Alkaline Phosphatase 78 39 - 117 U/L   Total Bilirubin 1.4 (H) 0.3 - 1.2 mg/dL   GFR calc non Af Amer 31 (L) >90 mL/min   GFR calc Af Amer 36 (L) >90 mL/min    Comment: (NOTE) The eGFR has been calculated using the CKD EPI equation. This calculation has not been validated in all clinical situations. eGFR's persistently <90 mL/min signify possible Chronic Kidney Disease.    Anion gap 11 5 - 15  Protime-INR     Status: Abnormal   Collection Time: 06/17/14  7:50 AM  Result Value Ref Range   Prothrombin Time 17.0 (H) 11.6 - 15.2 seconds   INR 1.37 0.00 - 1.49  I-stat troponin, ED     Status: Abnormal   Collection Time: 06/17/14  8:28 AM  Result Value Ref Range   Troponin i, poc 0.49 (HH) 0.00 - 0.08 ng/mL   Comment NOTIFIED PHYSICIAN    Comment 3            Comment: Due to the release kinetics of cTnI, a negative result within the first hours of the onset of symptoms does not rule out myocardial infarction with certainty. If myocardial infarction is still suspected, repeat the test at appropriate intervals.   Urinalysis, Routine w reflex microscopic     Status: Abnormal   Collection Time: 06/17/14  8:47 AM  Result Value Ref Range   Color, Urine YELLOW YELLOW   APPearance CLEAR CLEAR   Specific Gravity, Urine 1.010 1.005 - 1.030   pH 6.0 5.0 - 8.0   Glucose, UA NEGATIVE NEGATIVE mg/dL   Hgb urine dipstick NEGATIVE NEGATIVE   Bilirubin Urine NEGATIVE NEGATIVE   Ketones, ur NEGATIVE NEGATIVE mg/dL   Protein, ur NEGATIVE NEGATIVE mg/dL   Urobilinogen, UA 0.2 0.0 - 1.0 mg/dL   Nitrite NEGATIVE NEGATIVE   Leukocytes, UA TRACE (A) NEGATIVE  Urine microscopic-add on     Status: None   Collection Time: 06/17/14  8:47 AM  Result Value Ref Range   Squamous Epithelial / LPF RARE RARE    WBC, UA 0-2 <3 WBC/hpf   RBC / HPF 0-2 <3 RBC/hpf  Type and screen     Status: None   Collection Time: 06/17/14  8:48 AM  Result Value Ref Range   ABO/RH(D) A NEG    Antibody Screen NEG    Sample Expiration 06/20/2014   ABO/Rh     Status: None   Collection Time: 06/17/14  8:48 AM  Result Value Ref Range   ABO/RH(D) A NEG     IMAGING: Dg Chest 1 View  06/17/2014   CLINICAL DATA:  Pain following fall  EXAM: CHEST  1 VIEW  COMPARISON:  October 12, 2013  FINDINGS: There is no edema or consolidation. Heart is slightly enlarged with pulmonary vascularity within normal limits. No adenopathy. There is atherosclerotic change in the aorta. No pneumothorax. No acute fracture. There is extensive arthropathy in each shoulder. There is evidence of old trauma involving the left third rib.  IMPRESSION: No edema or consolidation. No change in cardiac silhouette. No pneumothorax.   Electronically Signed   By: Lowella Grip III M.D.   On: 06/17/2014 08:09   Dg Hip Unilat With Pelvis 2-3 Views Right  06/17/2014  CLINICAL DATA:  Fall this morning with right hip pain and lower extremity foreshortening. Initial encounter.  EXAM: RIGHT HIP (WITH PELVIS) 2-3 VIEWS  COMPARISON:  None.  FINDINGS: There is evidence of an acute, complete subcapital fracture of the proximal femur with mild displacement. No dislocation. No bony lesions are seen. The bony pelvis is intact.  IMPRESSION: Acute subcapital fracture of the right hip.   Electronically Signed   By: Aletta Edouard M.D.   On: 06/17/2014 08:07    HOSPITAL DIAGNOSES: Principal Problem:   Hip fracture, right Active Problems:   Accelerated hypertension   Elevated troponin   IMPRESSION/RECOMMENDATION:  1. Hip fracture- I suspect that this will need surgical repair. With regards to her preoperative risk, obviously will be elevated based on her age and elevated troponin. The troponin elevation is likely due to demand ischemia although it's very feasible she has  coronary disease. A prior echo does show signs of hypertensive cardiomyopathy with an EF of 65% and severe LVH. Further risk stratification will delay surgery and is unlikely to change the outcome significantly. After discussion with her son, it is unlikely for her to agree to coronary catheterization if her stress testing was abnormal, again this would also delay surgery and is really not indicated. 2. Elevated troponin- this likely represents demand ischemia, especially since she's had no anginal symptoms. I would recommend an echocardiogram to evaluate for any new wall motion abnormalities or significantly decreased LV function. If this appears normal and subsequent troponins are not markedly increased, she would be considered at least intermediate risk for surgery and it would be reasonable to go ahead and operate if she was agreeable. Further preoperative testing will delay her surgery and recovery.  Cardiology will follow along closely with you. I'm happy to see her as an outpatient in follow-up. Thank you for consulting [place].  Time Spent Directly with Patient: 45 minutes  Pixie Casino, MD, [facility] Attending Cardiologist [facility]  Belmira Daley C 06/17/2014, 11:32 AM

## 2014-06-17 NOTE — ED Provider Notes (Signed)
CSN: [id number]     Arrival date & time 06/17/14  0720 History   First MD Initiated Contact with Patient 06/17/14 [phone]     Chief Complaint  Patient presents with  . Fall     (Consider location/radiation/quality/duration/timing/severity/associated sxs/prior Treatment) The history is provided by the patient.  Carrie Moran is a 81 y.o. female hx of HTN, dementia who presented with fall with right hip pain. Patient was combing her head today and also balance and fell on the right hip. Denies any syncope or loss of consciousness. Denies any head injury and is not currently on any blood thinners. Patient had right hip pain afterwards but said that she was able to bear some weight on it. She does have frequent falls but usually catches herself.   Level V caveat- dementia    Past Medical History  Diagnosis Date  . Hypertension   . Memory loss   . GERD (gastroesophageal reflux disease)   . RLS (restless legs syndrome)   . Hypertonicity of bladder   . Renal insufficiency   . Other headache syndromes(339.89)   . Osteoporosis, unspecified   . Urinary frequency    Past Surgical History  Procedure Laterality Date  . Appendectomy  1930  . Rotator cuff repair  1986  . Rotator cuff repair  2001  . Carpal tunnel release  2006    Bilateral    Family History  Problem Relation Age of Onset  . Heart attack Mother   . Cancer Brother   . Cancer Brother    History  Substance Use Topics  . Smoking status: Former Research scientist (life sciences)  . Smokeless tobacco: Never Used  . Alcohol Use: No   OB History    No data available     Review of Systems  Musculoskeletal:       R hip pain   All other systems reviewed and are negative.     Allergies  Sulfa antibiotics  Home Medications   Prior to Admission medications   Medication Sig Start Date End Date Taking? Authorizing Provider  amLODipine (NORVASC) 10 MG tablet Take one tablet once daily to control Blood pressure   Yes Historical Provider, MD   aspirin 81 MG tablet Take 81 mg by mouth daily.   Yes Historical Provider, MD  cilostazol (PLETAL) 50 MG tablet Take 50 mg by mouth 2 (two) times daily.   Yes Historical Provider, MD  cloNIDine (CATAPRES) 0.1 MG tablet Take 1 tablet (0.1 mg total) by mouth 2 (two) times daily. 10/24/12  Yes Mahima Bubba Camp, MD  diclofenac sodium (VOLTAREN) 1 % GEL Apply 2 g topically 2 (two) times daily. 09/24/13  Yes Mahima Bubba Camp, MD  gabapentin (NEURONTIN) 100 MG capsule Take 1 capsule (100 mg total) by mouth at bedtime. 06/06/14  Yes Monica Carter, DO  latanoprost (XALATAN) 0.005 % ophthalmic solution Place 1 drop into both eyes daily. Instill 1 drop in each eye once daily for glaucoma.   Yes Historical Provider, MD  metoprolol tartrate (LOPRESSOR) 25 MG tablet Take 1 tablet (25 mg total) by mouth 2 (two) times daily. 07/09/13  Yes Mahima Pandey, MD  mirabegron ER (MYRBETRIQ) 25 MG TB24 tablet Take 1 tablet (25 mg total) by mouth daily. 09/24/13  Yes Mahima Bubba Camp, MD  omeprazole (PRILOSEC) 20 MG capsule Take 1 capsule (20 mg total) by mouth daily. 09/24/13  Yes Mahima Pandey, MD  senna-docusate (SENOKOT-S) 8.6-50 MG per tablet Take 2 tablets by mouth daily as needed for mild constipation.  Yes Historical Provider, MD  Timolol Maleate 0.5 % (DAILY) SOLN Place 1 drop into both eyes daily.    Yes Historical Provider, MD  topiramate (TOPAMAX) 25 MG tablet Take 1 tablet (25 mg total) by mouth 2 (two) times daily. 11/07/12  Yes Blanchie Serve, MD  traMADol (ULTRAM) 50 MG tablet Take one tablet by mouth once daily for pain 04/17/14  Yes Lauree Chandler, NP  AMBULATORY NON FORMULARY MEDICATION Ted compression stockings for both legs- wear them in the morning and remove it before going to bed    Historical Provider, MD  mirtazapine (REMERON) 15 MG tablet Take 0.5 tablets (7.5 mg total) by mouth at bedtime. Take half a tablet once a day for one week and then stop it Patient not taking: Reported on 06/17/2014 11/26/13   Blanchie Serve,  MD   BP 118/55 mmHg  Pulse 58  Temp(Src) 97.8 F (36.6 C) (Oral)  Resp 17  SpO2 92% Physical Exam  Constitutional:  Chronically ill, uncomfortable   HENT:  Head: Normocephalic and atraumatic.  Eyes: Conjunctivae are normal. Pupils are equal, round, and reactive to light.  Neck: Normal range of motion. Neck supple.  Cardiovascular: Normal rate, regular rhythm and normal heart sounds.   Pulmonary/Chest: Effort normal and breath sounds normal. No respiratory distress. She has no wheezes. She has no rales.  Abdominal: Soft. Bowel sounds are normal. She exhibits no distension. There is no tenderness. There is no rebound and no guarding.  Musculoskeletal:  R hip shortened and externally rotated. Unable to flex hip. No knee or tib/fib tenderness. 2+ pulses, nl sensation  Neurological: She is alert.  Demented, moving all extremities except RL extremity   Skin: Skin is warm and dry.  Psychiatric: She has a normal mood and affect. Her behavior is normal. Judgment and thought content normal.  Nursing note and vitals reviewed.   ED Course  Procedures (including critical care time) Labs Review Labs Reviewed  CBC WITH DIFFERENTIAL/PLATELET - Abnormal; Notable for the following:    WBC 22.2 (*)    Neutrophils Relative % 80 (*)    Neutro Abs 17.8 (*)    Monocytes Absolute 1.6 (*)    All other components within normal limits  COMPREHENSIVE METABOLIC PANEL - Abnormal; Notable for the following:    Glucose, Bld 265 (*)    BUN 34 (*)    Creatinine, Ser 1.41 (*)    AST 362 (*)    ALT 244 (*)    Total Bilirubin 1.4 (*)    GFR calc non Af Amer 31 (*)    GFR calc Af Amer 36 (*)    All other components within normal limits  PROTIME-INR - Abnormal; Notable for the following:    Prothrombin Time 17.0 (*)    All other components within normal limits  URINALYSIS, ROUTINE W REFLEX MICROSCOPIC - Abnormal; Notable for the following:    Leukocytes, UA TRACE (*)    All other components within  normal limits  I-STAT TROPOININ, ED - Abnormal; Notable for the following:    Troponin i, poc 0.49 (*)    All other components within normal limits  URINE MICROSCOPIC-ADD ON  TYPE AND SCREEN  ABO/RH    Imaging Review Dg Chest 1 View  06/17/2014   CLINICAL DATA:  Pain following fall  EXAM: CHEST  1 VIEW  COMPARISON:  October 12, 2013  FINDINGS: There is no edema or consolidation. Heart is slightly enlarged with pulmonary vascularity within normal limits. No adenopathy. There  is atherosclerotic change in the aorta. No pneumothorax. No acute fracture. There is extensive arthropathy in each shoulder. There is evidence of old trauma involving the left third rib.  IMPRESSION: No edema or consolidation. No change in cardiac silhouette. No pneumothorax.   Electronically Signed   By: Lowella Grip III M.D.   On: 06/17/2014 08:09   Dg Hip Unilat With Pelvis 2-3 Views Right  06/17/2014   CLINICAL DATA:  Fall this morning with right hip pain and lower extremity foreshortening. Initial encounter.  EXAM: RIGHT HIP (WITH PELVIS) 2-3 VIEWS  COMPARISON:  None.  FINDINGS: There is evidence of an acute, complete subcapital fracture of the proximal femur with mild displacement. No dislocation. No bony lesions are seen. The bony pelvis is intact.  IMPRESSION: Acute subcapital fracture of the right hip.   Electronically Signed   By: Aletta Edouard M.D.   On: 06/17/2014 08:07     EKG Interpretation None       <ECG>  EKG: normal EKG, normal sinus rhythm, unchanged from previous tracings. LAFB.   MDM   Final diagnoses:  Fall    Carrie Moran is a 81 y.o. female here with R hip pain s/p fall. Concerned for hip fracture. Will get xray, preop labs. No head injury.   8:45 AM Troponin positive. Given possible near syncope, consulted cardiology for preop clearance. Also has R subcapital hip fracture. Will consult ortho and admit to medicine.   9:35 AM WBC 22. CXR and UA unremarkable. Likely stress  reaction. Consulted Dr. Doran Durand from ortho and will admit to medicine.    Wandra Arthurs, MD 06/17/14 [phone]

## 2014-06-17 NOTE — ED Notes (Signed)
Pt back from x-ray.

## 2014-06-17 NOTE — ED Notes (Signed)
Bed: WA27 Expected date:  Expected time:  Means of arrival:  Comments: [place]

## 2014-06-17 NOTE — ED Notes (Signed)
Pt fell today. Per patient "lost balance and could not catch myself". Pt fell on right hip. Now c/o right hip pain. Pt given Zofan 4mg  by EMS pta.

## 2014-06-17 NOTE — ED Notes (Signed)
Cardiologist at bedside.  

## 2014-06-17 NOTE — Progress Notes (Signed)
Clinical Social Work Department BRIEF PSYCHOSOCIAL ASSESSMENT 06/17/2014  Patient:  Carrie Moran, Carrie Moran     Account Number:  [id number]     Admit date:  06/17/2014  Clinical Social Worker:  Glorious Peach, CLINICAL SOCIAL WORKER  Date/Time:  06/17/2014 03:46 PM  Referred by:  Physician  Date Referred:  06/17/2014 Referred for  SNF Placement   Other Referral:   Interview type:  Family Other interview type:   SonRichardson Landry at bedside.    PSYCHOSOCIAL DATA Living Status:  FACILITY Admitted from facility:  [facility] Level of care:  Assisted Living Primary support name:  [name] & Gerald Stabs Primary support relationship to patient:  CHILD, ADULT Degree of support available:   Adequate    CURRENT CONCERNS Current Concerns  Post-Acute Placement   Other Concerns:    SOCIAL WORK ASSESSMENT / PLAN CSW received consult being that patient is admitted with a hip fracture. CSW completed FL2, faxed patient out to [facility].   Assessment/plan status:  Information/Referral to [employer] Other assessment/ plan:   Information/referral to community resources:   CSW awaiting bed offers. CSW faxed FL2 out to [facility], met with family regarding DC plans.    PATIENT'S/FAMILY'S RESPONSE TO PLAN OF CARE: CSW met with patient and son, [name] at bedside. Introduced self and explained role. CSW informed patient's son since patient has a hip fracture and will have surgery, our MD and PT would most likely recommend SNF for short term rehab. Son understands the need for short term rehab after fracture and surgery. Son states that patient has not been to a SNF in the past to his knowledge. CSW gave son a list of SNFs in [place] that the family can look into. CSW also informed son that we have faxed out information to SNFs and we're awaiting bed offers. CSW will follow up with bed offers, and continue to follow for further DC planning.       Glorious Peach BSW Intern

## 2014-06-17 NOTE — ED Notes (Signed)
Spoke to [employer] @ 12:10...klj

## 2014-06-17 NOTE — Consult Note (Signed)
ORTHOPAEDIC CONSULTATION  REQUESTING PHYSICIAN: Venetia Maxon Rama, MD  PCP:  Blanchie Serve, MD  Chief Complaint: right hip pain  HPI: Carrie Moran is a 81 y.o. female who complains of  Right hip pain. She states that she got dizzy and fell this morning while combing her hair. She was unable to weight bear. She was brought to the ED, where workup revealed a displaced femoral neck fracture. She was admitted by the hospitalist, and cardiology is evaluating her. She is a household ambulator with a walker.  Past Medical History  Diagnosis Date  . Hypertension   . Memory loss   . GERD (gastroesophageal reflux disease)   . RLS (restless legs syndrome)   . Hypertonicity of bladder   . Renal insufficiency   . Other headache syndromes(339.89)   . Osteoporosis, unspecified   . Urinary frequency   . Accelerated hypertension 07/09/2013  . Stage III chronic kidney disease 06/17/2014  . Hip fracture requiring operative repair 06/17/2014  . Primary osteoarthritis involving multiple joints 09/24/2013  . PVD (peripheral vascular disease) 11/07/2012  . Glaucoma 11/07/2012  . Depression 11/07/2012  . Overactive bladder 10/17/2012   Past Surgical History  Procedure Laterality Date  . Appendectomy  1930  . Rotator cuff repair  1986  . Rotator cuff repair  2001  . Carpal tunnel release  2006    Bilateral    History   Social History  . Marital Status: Widowed    Spouse Name: N/A  . Number of Children: N/A  . Years of Education: N/A   Social History Main Topics  . Smoking status: Former Research scientist (life sciences)  . Smokeless tobacco: Never Used  . Alcohol Use: No  . Drug Use: No  . Sexual Activity: Not on file   Other Topics Concern  . None   Social History Narrative   Lives at [facility]   Widowed   Former smoker   Alcohol none   Exercise does chair exercise once a day 5 days a week            Family History  Problem Relation Age of Onset  . Heart attack Mother   . Cancer Brother   .  Cancer Brother    Allergies  Allergen Reactions  . Sulfa Antibiotics     Unknown reaction.    Prior to Admission medications   Medication Sig Start Date End Date Taking? Authorizing Provider  amLODipine (NORVASC) 10 MG tablet Take one tablet once daily to control Blood pressure   Yes Historical Provider, MD  aspirin 81 MG tablet Take 81 mg by mouth daily.   Yes Historical Provider, MD  cilostazol (PLETAL) 50 MG tablet Take 50 mg by mouth 2 (two) times daily.   Yes Historical Provider, MD  cloNIDine (CATAPRES) 0.1 MG tablet Take 1 tablet (0.1 mg total) by mouth 2 (two) times daily. 10/24/12  Yes Mahima Bubba Camp, MD  diclofenac sodium (VOLTAREN) 1 % GEL Apply 2 g topically 2 (two) times daily. 09/24/13  Yes Mahima Bubba Camp, MD  gabapentin (NEURONTIN) 100 MG capsule Take 1 capsule (100 mg total) by mouth at bedtime. 06/06/14  Yes Monica Carter, DO  latanoprost (XALATAN) 0.005 % ophthalmic solution Place 1 drop into both eyes daily. Instill 1 drop in each eye once daily for glaucoma.   Yes Historical Provider, MD  metoprolol tartrate (LOPRESSOR) 25 MG tablet Take 1 tablet (25 mg total) by mouth 2 (two) times daily. 07/09/13  Yes Blanchie Serve, MD  mirabegron  ER (MYRBETRIQ) 25 MG TB24 tablet Take 1 tablet (25 mg total) by mouth daily. 09/24/13  Yes Mahima Bubba Camp, MD  omeprazole (PRILOSEC) 20 MG capsule Take 1 capsule (20 mg total) by mouth daily. 09/24/13  Yes Mahima Pandey, MD  senna-docusate (SENOKOT-S) 8.6-50 MG per tablet Take 2 tablets by mouth daily as needed for mild constipation.   Yes Historical Provider, MD  Timolol Maleate 0.5 % (DAILY) SOLN Place 1 drop into both eyes daily.    Yes Historical Provider, MD  topiramate (TOPAMAX) 25 MG tablet Take 1 tablet (25 mg total) by mouth 2 (two) times daily. 11/07/12  Yes Blanchie Serve, MD  traMADol (ULTRAM) 50 MG tablet Take one tablet by mouth once daily for pain 04/17/14  Yes Lauree Chandler, NP  AMBULATORY NON FORMULARY MEDICATION Ted compression stockings  for both legs- wear them in the morning and remove it before going to bed    Historical Provider, MD  mirtazapine (REMERON) 15 MG tablet Take 0.5 tablets (7.5 mg total) by mouth at bedtime. Take half a tablet once a day for one week and then stop it Patient not taking: Reported on 06/17/2014 11/26/13   Blanchie Serve, MD   Dg Chest 1 View  06/17/2014   CLINICAL DATA:  Pain following fall  EXAM: CHEST  1 VIEW  COMPARISON:  October 12, 2013  FINDINGS: There is no edema or consolidation. Heart is slightly enlarged with pulmonary vascularity within normal limits. No adenopathy. There is atherosclerotic change in the aorta. No pneumothorax. No acute fracture. There is extensive arthropathy in each shoulder. There is evidence of old trauma involving the left third rib.  IMPRESSION: No edema or consolidation. No change in cardiac silhouette. No pneumothorax.   Electronically Signed   By: Lowella Grip III M.D.   On: 06/17/2014 08:09   Dg Hip Unilat With Pelvis 2-3 Views Right  06/17/2014   CLINICAL DATA:  Fall this morning with right hip pain and lower extremity foreshortening. Initial encounter.  EXAM: RIGHT HIP (WITH PELVIS) 2-3 VIEWS  COMPARISON:  None.  FINDINGS: There is evidence of an acute, complete subcapital fracture of the proximal femur with mild displacement. No dislocation. No bony lesions are seen. The bony pelvis is intact.  IMPRESSION: Acute subcapital fracture of the right hip.   Electronically Signed   By: Aletta Edouard M.D.   On: 06/17/2014 08:07   Dg Femur, Min 2 Views Right  06/17/2014   CLINICAL DATA:  Recent fall with known hip fracture  EXAM: RIGHT FEMUR 2 VIEWS  COMPARISON:  None.  FINDINGS: Subcapital femoral neck fracture is noted with some impaction at the fracture site. Degenerative changes about the knee joint are seen. Diffuse vascular calcifications are noted. No other fractures are seen.  IMPRESSION: Subcapital right femoral neck fracture. No other acute abnormality is seen.    Electronically Signed   By: Inez Catalina M.D.   On: 06/17/2014 12:09    Positive ROS: All other systems have been reviewed and were otherwise negative with the exception of those mentioned in the HPI and as above.  Physical Exam: General: Alert, no acute distress Cardiovascular: No pedal edema Respiratory: No cyanosis, no use of accessory musculature GI: No organomegaly, abdomen is distended and mildly tender Skin: No lesions in the area of chief complaint Neurologic: Sensation intact distally Lymphatic: No axillary or cervical lymphadenopathy  MUSCULOSKELETAL:  RLE: shortened and externally rotated. Pain with ROM. +TA/GS/EHL. 2+ DP. SILT. BUE: no trauma. Painless ROM. LLE:  able to SLR. Painless ROM.  Assessment: Displaced right femoral neck fracture  Plan: I discussed the findings with the patient and her family. Given her pain and inability to mobilize, I recommended right hip hemiarthroplasty vs THA. The risks, benefits, and alternatives were discussed with the patient and family. There are risks associated with the surgery including, but not limited to, problems with anesthesia (death), infection, dislocation, instability (giving out of the joint), dislocation, differences in leg length/angulation/rotation, fracture of bones, loosening or failure of implants, hematoma (blood accumulation) which may require surgical drainage, blood clots, pulmonary embolism, nerve injury (foot drop), and blood vessel injury. They understand that she has about a 30% one year mortality rate. The family understands these risks and elects to proceed. She is posted for Thursday pending cardiology clearance.     Keora Eccleston, Horald Pollen, MD Cell [phone]    06/17/2014 5:58 PM

## 2014-06-18 ENCOUNTER — Inpatient Hospital Stay (HOSPITAL_COMMUNITY): Payer: Medicare Other

## 2014-06-18 DIAGNOSIS — N179 Acute kidney failure, unspecified: Secondary | ICD-10-CM

## 2014-06-18 DIAGNOSIS — N183 Chronic kidney disease, stage 3 unspecified: Secondary | ICD-10-CM

## 2014-06-18 DIAGNOSIS — S72009A Fracture of unspecified part of neck of unspecified femur, initial encounter for closed fracture: Secondary | ICD-10-CM | POA: Insufficient documentation

## 2014-06-18 LAB — TROPONIN I
TROPONIN I: 0.96 ng/mL — AB (ref <0.031)
Troponin I: 1.55 ng/mL (ref <0.031)
Troponin I: 1.45 ng/mL (ref <0.031)

## 2014-06-18 LAB — COMPREHENSIVE METABOLIC PANEL
ALK PHOS: 58 U/L (ref 39–117)
ALT: 157 U/L — ABNORMAL HIGH (ref 0–35)
ANION GAP: 15 (ref 5–15)
AST: 115 U/L — ABNORMAL HIGH (ref 0–37)
Albumin: 3.2 g/dL — ABNORMAL LOW (ref 3.5–5.2)
BILIRUBIN TOTAL: 0.8 mg/dL (ref 0.3–1.2)
BUN: 48 mg/dL — AB (ref 6–23)
CHLORIDE: 108 mmol/L (ref 96–112)
CO2: 19 mmol/L (ref 19–32)
Calcium: 8.4 mg/dL (ref 8.4–10.5)
Creatinine, Ser: 1.86 mg/dL — ABNORMAL HIGH (ref 0.50–1.10)
GFR calc Af Amer: 26 mL/min — ABNORMAL LOW (ref >90)
GFR calc non Af Amer: 22 mL/min — ABNORMAL LOW (ref >90)
Glucose, Bld: 120 mg/dL — ABNORMAL HIGH (ref 70–99)
Potassium: 4.3 mmol/L (ref 3.5–5.1)
Sodium: 142 mmol/L (ref 135–145)
Total Protein: 5.5 g/dL — ABNORMAL LOW (ref 6.0–8.3)

## 2014-06-18 MED ORDER — FLEET ENEMA 7-19 GM/118ML RE ENEM
1.0000 | ENEMA | Freq: Once | RECTAL | Status: AC
  Administered 2014-06-18: 1 via RECTAL
  Filled 2014-06-18: qty 1

## 2014-06-18 MED ORDER — ENOXAPARIN SODIUM 30 MG/0.3ML ~~LOC~~ SOLN
30.0000 mg | SUBCUTANEOUS | Status: DC
  Administered 2014-06-18: 30 mg via SUBCUTANEOUS
  Filled 2014-06-18: qty 0.3

## 2014-06-18 MED ORDER — HYDROCODONE-ACETAMINOPHEN 5-325 MG PO TABS
1.0000 | ORAL_TABLET | Freq: Four times a day (QID) | ORAL | Status: DC | PRN
  Administered 2014-06-18 – 2014-06-22 (×5): 1 via ORAL
  Filled 2014-06-18 (×6): qty 1
  Filled 2014-06-18: qty 2

## 2014-06-18 NOTE — Progress Notes (Signed)
Pt seen today. C/o R hip pain. KUB done yesterday - showed gas and stool.  NAD Abd: distended and mildly TTP RLE: shortened and externally rotated. Pain with ROM. 2+ DP. SILT. +TA/GS/EHL  1. Displaced R femoral neck fx 2. transaminitis - RUQ [place] pending 3. Constipation Fleet's enema, bowel regimen Cardiology clearance pending Plan for OR tomorrow NPO after MN tonight

## 2014-06-18 NOTE — Progress Notes (Signed)
PROGRESS NOTE  Carrie Moran YME:[id number] DOB: 04/03/1918 DOA: 06/17/2014 PCP: Blanchie Serve, MD  Assessment/Plan:  Hip fracture, right, requiring operative repair Preoperative risk assessment / clearance  No angina, dyspnea, syncope, and palpitations, history of heart disease including ischemic, valvular, or myopathic disease, no history of diabetes, or cerebrovascular disease. She does have a history of hypertension, chronic kidney disease and peripheral artery disease.   Cardiac functional status : 1 MET (Can take care of self, such as eat, dress, or use the toilet (1 MET)  Preoperative ECG reviewed and shows possible prior ischemic event. Anteroseptal Q waves noted.  She is at moderate risk for perioperative MI given her elevated troponins, abnormal EKG, and comorbidities, but may wish to proceed with surgical intervention despite known risks to preserve her mobility.  Cardiology following--appreciated  Surgery tentatively planned for 06/19/14-unless HIDA suggests cholecystitis   Leukocytosis / transaminitis   -    May have component of stress demargination, but concerned about          Abnormal abd [place]   -     Abd US--distended gallbladder with 2.6 m stone, but difficult to                 assess gallbladder   -     HIDA scan  Chest x-ray clear. Urinalysis negative for nitrites with small leukocytes and negative bacteria.   CT abdomen with WBC 22 and diffuse abd pain     Constipation / Abdominal distention  Check KUB--constipation  Fleets enema-->large BM, but still with abdominal pain   Essential hypertension, benign  Continue Norvasc, metoprolol and clonidine.   Glaucoma  Continue Xalatan.   Elevated troponin/demand ischemia  Cardiology following.  2-D echo shows EF 65-70 percent with grade 1 diastolic dysfunction. No significant changes from prior 2-D echo, supporting a diagnosis of demand ischemia. Given this, we'll hold aspirin in  anticipation of surgery.   Peripheral vascular disease   Continue Pletal.   Acute on Chronic Stage III chronic kidney disease  Creatinine 1.2-1.6 at baseline. Current creatinine 1.4.  Renal US   DVT prophylaxis  Lovenox ordered.  Code Status: DNR. Family Communication: Son at the bedside 06/18/14 Disposition Plan: Home when stable.         Procedures/Studies: Dg Chest 1 View  06/17/2014   CLINICAL DATA:  Pain following fall  EXAM: CHEST  1 VIEW  COMPARISON:  October 12, 2013  FINDINGS: There is no edema or consolidation. Heart is slightly enlarged with pulmonary vascularity within normal limits. No adenopathy. There is atherosclerotic change in the aorta. No pneumothorax. No acute fracture. There is extensive arthropathy in each shoulder. There is evidence of old trauma involving the left third rib.  IMPRESSION: No edema or consolidation. No change in cardiac silhouette. No pneumothorax.   Electronically Signed   By: Lowella Grip III M.D.   On: 06/17/2014 08:09   Dg Abd 1 View  06/17/2014   CLINICAL DATA:  Abdominal distension. History of renal insufficiency.  EXAM: ABDOMEN - 1 VIEW  COMPARISON:  CT 01/30/2010  FINDINGS: There is a displaced subcapital right femur fracture. Left hip appears intact. Large amount of gas and stool within the abdomen and pelvis. In particular, there is a large amount of gas in the mid abdomen. Limited evaluation for free air on this supine image. Levoscoliosis of the lumbar spine.  IMPRESSION: Large amount of gas and stool throughout the abdomen and  pelvis.  Right subcapital femur fracture.   Electronically Signed   By: Markus Daft M.D.   On: 06/17/2014 20:15   Dg Hip Unilat With Pelvis 2-3 Views Right  06/17/2014   CLINICAL DATA:  Fall this morning with right hip pain and lower extremity foreshortening. Initial encounter.  EXAM: RIGHT HIP (WITH PELVIS) 2-3 VIEWS  COMPARISON:  None.  FINDINGS: There is evidence of an acute, complete subcapital  fracture of the proximal femur with mild displacement. No dislocation. No bony lesions are seen. The bony pelvis is intact.  IMPRESSION: Acute subcapital fracture of the right hip.   Electronically Signed   By: Aletta Edouard M.D.   On: 06/17/2014 08:07   Dg Femur, Min 2 Views Right  06/17/2014   CLINICAL DATA:  Recent fall with known hip fracture  EXAM: RIGHT FEMUR 2 VIEWS  COMPARISON:  None.  FINDINGS: Subcapital femoral neck fracture is noted with some impaction at the fracture site. Degenerative changes about the knee joint are seen. Diffuse vascular calcifications are noted. No other fractures are seen.  IMPRESSION: Subcapital right femoral neck fracture. No other acute abnormality is seen.   Electronically Signed   By: Inez Catalina M.D.   On: 06/17/2014 12:09   US Abdomen Limited Ruq  06/18/2014   CLINICAL DATA:  Abnormal elevated liver enzymes, elevated creatinine and  EXAM: US ABDOMEN LIMITED - RIGHT UPPER QUADRANT  COMPARISON:  None.  FINDINGS: Gallbladder:  Distended to at least 7-8 mm. There is a 2.6 cm stone. Wall thickness is within normal limits at 2 mm and there is no Murphy's sign.  Common bile duct:  Diameter: Measures between 3-6 mm  Liver:  1 cm cyst in the left lobe. No biliary dilatation. No focal abnormalities.  IMPRESSION: Study limited by patient in mobility related to hip fracture. Gallbladder is difficult to completely image. There is a gallstone with no Murphy's sign.   Electronically Signed   By: Skipper Cliche M.D.   On: 06/18/2014 13:29         Subjective: Patient is pleasantly confused. Denies any chest pain, shortness breath, vomiting, diarrhea. Complains of abdominal pain. No dysuria.  Objective: Filed Vitals:   06/17/14 1458 06/17/14 2156 06/18/14 0538 06/18/14 1500  BP:  127/72 117/63 120/63  Pulse:  71 69 69  Temp: 97.5 F (36.4 C) 98.8 F (37.1 C) 97.7 F (36.5 C) 97.8 F (36.6 C)  TempSrc: Axillary Axillary Oral Axillary  Resp:  16 16 16   Height:       Weight:      SpO2:  100% 98% 98%    Intake/Output Summary (Last 24 hours) at 06/18/14 1854 Last data filed at 06/18/14 1500  Gross per 24 hour  Intake 1798.75 ml  Output    175 ml  Net 1623.75 ml   Weight change:  Exam:   General:  Pt is alert, follows commands appropriately, not in acute distress  HEENT: No icterus, No thrush,  [place]/AT  Cardiovascular: RRR, S1/S2, no rubs, no gallops  Respiratory: Bibasilar crackles. No wheeze. Good air movement.  Abdomen: Soft/+BS, diffusely tender. No rebound. non distended, no guarding  Extremities: No edema, No lymphangitis, No petechiae, No rashes, no synovitis  Data Reviewed: Basic Metabolic Panel:  Recent Labs Lab 06/17/14 0750 06/18/14 0605  NA 139 142  K 4.0 4.3  CL 108 108  CO2 20 19  GLUCOSE 265* 120*  BUN 34* 48*  CREATININE 1.41* 1.86*  CALCIUM 9.1 8.4  Liver Function Tests:  Recent Labs Lab 06/17/14 0750 06/18/14 0605  AST 362* 115*  ALT 244* 157*  ALKPHOS 78 58  BILITOT 1.4* 0.8  PROT 6.2 5.5*  ALBUMIN 3.6 3.2*   No results for input(s): LIPASE, AMYLASE in the last 168 hours. No results for input(s): AMMONIA in the last 168 hours. CBC:  Recent Labs Lab 06/17/14 0750  WBC 22.2*  NEUTROABS 17.8*  HGB 13.9  HCT 45.1  MCV 97.2  PLT 160   Cardiac Enzymes:  Recent Labs Lab 06/18/14 0704 06/18/14 1242  TROPONINI 1.55* 1.45*   BNP: Invalid input(s): POCBNP CBG: No results for input(s): GLUCAP in the last 168 hours.  Recent Results (from the past 240 hour(s))  MRSA PCR Screening     Status: None   Collection Time: 06/17/14  1:00 PM  Result Value Ref Range Status   MRSA by PCR NEGATIVE NEGATIVE Final    Comment:        The GeneXpert MRSA Assay (FDA approved for NASAL specimens only), is one component of a comprehensive MRSA colonization surveillance program. It is not intended to diagnose MRSA infection nor to guide or monitor treatment for MRSA infections.      Scheduled  Meds: . amLODipine  10 mg Oral Daily  . cilostazol  50 mg Oral BID  . cloNIDine  0.1 mg Oral BID  . diclofenac sodium  2 g Topical BID  . enoxaparin (LOVENOX) injection  30 mg Subcutaneous Q24H  . gabapentin  100 mg Oral QHS  . latanoprost  1 drop Both Eyes Daily  . metoprolol tartrate  25 mg Oral BID  . mirabegron ER  25 mg Oral Daily  . pantoprazole  40 mg Oral Daily  . polyethylene glycol  17 g Oral Daily  . timolol  1 drop Both Eyes Daily  . topiramate  25 mg Oral BID   Continuous Infusions: . sodium chloride 100 mL/hr at 06/18/14 1708     Kellen Dutch, DO  [facility] Pager [phone]  If 7PM-7AM, please contact night-coverage [email or web address] Password [email or web address] 06/18/2014, 6:54 PM   LOS: 1 day

## 2014-06-18 NOTE — Progress Notes (Addendum)
SUBJECTIVE:  Very sleep but no complaints  OBJECTIVE:   Vitals:   Filed Vitals:   06/17/14 1300 06/17/14 1458 06/17/14 2156 06/18/14 0538  BP: 114/73  127/72 117/63  Pulse: 70  71 69  Temp:  97.5 F (36.4 C) 98.8 F (37.1 C) 97.7 F (36.5 C)  TempSrc:  Axillary Axillary Oral  Resp: 18  16 16   Height: 5\' 1"  (1.549 m)     Weight: 106 lb 0.7 oz (48.1 kg)     SpO2: 95%  100% 98%   I&O's:   Intake/Output Summary (Last 24 hours) at 06/18/14 1423 Last data filed at 06/18/14 0700  Gross per 24 hour  Intake 1348.75 ml  Output    100 ml  Net 1248.75 ml   TELEMETRY: Reviewed telemetry pt in NSR:     PHYSICAL EXAM General: Well developed, well nourished, in no acute distress Head: Eyes PERRLA, No xanthomas.   Normal cephalic and atramatic  Lungs:   Clear bilaterally to auscultation and percussion. Heart:   HRRR S1 S2 Pulses are 2+ & equal. 1/6 SM at RUSB Abdomen: Bowel sounds are positive, abdomen soft and non-tender without masses  Extremities:   No clubbing, cyanosis or edema.  DP +1 Neuro: Alert and oriented X 3. Psych:  Good affect, responds appropriately   LABS: Basic Metabolic Panel:  Recent Labs  06/17/14 0750 06/18/14 0605  NA 139 142  K 4.0 4.3  CL 108 108  CO2 20 19  GLUCOSE 265* 120*  BUN 34* 48*  CREATININE 1.41* 1.86*  CALCIUM 9.1 8.4   Liver Function Tests:  Recent Labs  06/17/14 0750 06/18/14 0605  AST 362* 115*  ALT 244* 157*  ALKPHOS 78 58  BILITOT 1.4* 0.8  PROT 6.2 5.5*  ALBUMIN 3.6 3.2*   No results for input(s): LIPASE, AMYLASE in the last 72 hours. CBC:  Recent Labs  06/17/14 0750  WBC 22.2*  NEUTROABS 17.8*  HGB 13.9  HCT 45.1  MCV 97.2  PLT 160   Cardiac Enzymes:  Recent Labs  06/18/14 0704 06/18/14 1242  TROPONINI 1.55* 1.45*   BNP: Invalid input(s): POCBNP D-Dimer: No results for input(s): DDIMER in the last 72 hours. Hemoglobin A1C: No results for input(s): HGBA1C in the last 72 hours. Fasting Lipid  Panel: No results for input(s): CHOL, HDL, LDLCALC, TRIG, CHOLHDL, LDLDIRECT in the last 72 hours. Thyroid Function Tests: No results for input(s): TSH, T4TOTAL, T3FREE, THYROIDAB in the last 72 hours.  Invalid input(s): FREET3 Anemia Panel: No results for input(s): VITAMINB12, FOLATE, FERRITIN, TIBC, IRON, RETICCTPCT in the last 72 hours. Coag Panel:   Lab Results  Component Value Date   INR 1.37 06/17/2014   INR 1.03 05/02/2009    RADIOLOGY: Dg Chest 1 View  06/17/2014   CLINICAL DATA:  Pain following fall  EXAM: CHEST  1 VIEW  COMPARISON:  October 12, 2013  FINDINGS: There is no edema or consolidation. Heart is slightly enlarged with pulmonary vascularity within normal limits. No adenopathy. There is atherosclerotic change in the aorta. No pneumothorax. No acute fracture. There is extensive arthropathy in each shoulder. There is evidence of old trauma involving the left third rib.  IMPRESSION: No edema or consolidation. No change in cardiac silhouette. No pneumothorax.   Electronically Signed   By: Lowella Grip III M.D.   On: 06/17/2014 08:09   Dg Abd 1 View  06/17/2014   CLINICAL DATA:  Abdominal distension. History of renal insufficiency.  EXAM: ABDOMEN -  1 VIEW  COMPARISON:  CT 01/30/2010  FINDINGS: There is a displaced subcapital right femur fracture. Left hip appears intact. Large amount of gas and stool within the abdomen and pelvis. In particular, there is a large amount of gas in the mid abdomen. Limited evaluation for free air on this supine image. Levoscoliosis of the lumbar spine.  IMPRESSION: Large amount of gas and stool throughout the abdomen and pelvis.  Right subcapital femur fracture.   Electronically Signed   By: Markus Daft M.D.   On: 06/17/2014 20:15   Dg Hip Unilat With Pelvis 2-3 Views Right  06/17/2014   CLINICAL DATA:  Fall this morning with right hip pain and lower extremity foreshortening. Initial encounter.  EXAM: RIGHT HIP (WITH PELVIS) 2-3 VIEWS  COMPARISON:   None.  FINDINGS: There is evidence of an acute, complete subcapital fracture of the proximal femur with mild displacement. No dislocation. No bony lesions are seen. The bony pelvis is intact.  IMPRESSION: Acute subcapital fracture of the right hip.   Electronically Signed   By: Aletta Edouard M.D.   On: 06/17/2014 08:07   Dg Femur, Min 2 Views Right  06/17/2014   CLINICAL DATA:  Recent fall with known hip fracture  EXAM: RIGHT FEMUR 2 VIEWS  COMPARISON:  None.  FINDINGS: Subcapital femoral neck fracture is noted with some impaction at the fracture site. Degenerative changes about the knee joint are seen. Diffuse vascular calcifications are noted. No other fractures are seen.  IMPRESSION: Subcapital right femoral neck fracture. No other acute abnormality is seen.   Electronically Signed   By: Inez Catalina M.D.   On: 06/17/2014 12:09   US Abdomen Limited Ruq  06/18/2014   CLINICAL DATA:  Abnormal elevated liver enzymes, elevated creatinine and  EXAM: US ABDOMEN LIMITED - RIGHT UPPER QUADRANT  COMPARISON:  None.  FINDINGS: Gallbladder:  Distended to at least 7-8 mm. There is a 2.6 cm stone. Wall thickness is within normal limits at 2 mm and there is no Murphy's sign.  Common bile duct:  Diameter: Measures between 3-6 mm  Liver:  1 cm cyst in the left lobe. No biliary dilatation. No focal abnormalities.  IMPRESSION: Study limited by patient in mobility related to hip fracture. Gallbladder is difficult to completely image. There is a gallstone with no Murphy's sign.   Electronically Signed   By: Skipper Cliche M.D.   On: 06/18/2014 13:29    HOSPITAL DIAGNOSES: Principal Problem:  Hip fracture, right Active Problems:  Accelerated hypertension  Elevated troponin   IMPRESSION/RECOMMENDATION:  1. Hip fracture- Needs surgical repair. With regards to her preoperative risk, obviously will be elevated based on her age and elevated troponin. The troponin elevation is likely due to demand ischemia  although it's very feasible she has coronary disease. A prior echo does show signs of hypertensive cardiomyopathy with an EF of 65% and severe LVH. Elevated troponin- this likely represents demand ischemia, especially since she's had no anginal symptoms but did peak at 1.55.    2D echo showed normal LVF with moderate LVH and severe basal septal hypertrophy and moderate TR with moderate pulmonary HTN.  There was no evidence of intracavitary gradient.  She is considered at least intermediate to high risk for surgery.  Troponin has peaked at 1.55 and trending downward.  She could very well have underlying CAD but further risk stratification will delay surgery and is unlikely to change the outcome significantly. Dr. Debara Pickett spoke with her son, it was felt  that it would be unlikely for her to agree to coronary catheterization if her stress testing was abnormal, again this would also delay surgery and is really not indicated. 2. Would try to avoid preload reduction during surgery which could create dynamic outflow obstruction in the setting of severe LVH.    Sueanne Margarita, MD  06/18/2014  2:23 PM

## 2014-06-19 ENCOUNTER — Inpatient Hospital Stay (HOSPITAL_COMMUNITY): Payer: Medicare Other

## 2014-06-19 ENCOUNTER — Encounter (HOSPITAL_COMMUNITY): Payer: Self-pay | Admitting: Registered Nurse

## 2014-06-19 ENCOUNTER — Inpatient Hospital Stay (HOSPITAL_COMMUNITY): Payer: Medicare Other | Admitting: Registered Nurse

## 2014-06-19 ENCOUNTER — Inpatient Hospital Stay (HOSPITAL_COMMUNITY): Admission: RE | Admit: 2014-06-19 | Payer: Medicare Other | Source: Ambulatory Visit | Admitting: Orthopedic Surgery

## 2014-06-19 ENCOUNTER — Encounter (HOSPITAL_COMMUNITY)
Admission: EM | Disposition: A | Discharge status: Skilled Nursing Facility | Payer: Self-pay | Source: Home / Self Care | Attending: Internal Medicine

## 2014-06-19 DIAGNOSIS — R740 Nonspecific elevation of levels of transaminase and lactic acid dehydrogenase [LDH]: Secondary | ICD-10-CM

## 2014-06-19 DIAGNOSIS — S72001G Fracture of unspecified part of neck of right femur, subsequent encounter for closed fracture with delayed healing: Secondary | ICD-10-CM

## 2014-06-19 DIAGNOSIS — R7401 Elevation of levels of liver transaminase levels: Secondary | ICD-10-CM | POA: Insufficient documentation

## 2014-06-19 HISTORY — PX: TOTAL HIP ARTHROPLASTY: SHX124

## 2014-06-19 LAB — COMPREHENSIVE METABOLIC PANEL
ALT: 95 U/L — AB (ref 0–35)
AST: 50 U/L — ABNORMAL HIGH (ref 0–37)
Albumin: 3.2 g/dL — ABNORMAL LOW (ref 3.5–5.2)
Alkaline Phosphatase: 54 U/L (ref 39–117)
Anion gap: 12 (ref 5–15)
BUN: 65 mg/dL — ABNORMAL HIGH (ref 6–23)
CO2: 17 mmol/L — AB (ref 19–32)
Calcium: 7.9 mg/dL — ABNORMAL LOW (ref 8.4–10.5)
Chloride: 109 mmol/L (ref 96–112)
Creatinine, Ser: 1.79 mg/dL — ABNORMAL HIGH (ref 0.50–1.10)
GFR calc non Af Amer: 23 mL/min — ABNORMAL LOW (ref >90)
GFR, EST AFRICAN AMERICAN: 27 mL/min — AB (ref >90)
Glucose, Bld: 98 mg/dL (ref 70–99)
POTASSIUM: 4.2 mmol/L (ref 3.5–5.1)
Sodium: 138 mmol/L (ref 135–145)
TOTAL PROTEIN: 5.4 g/dL — AB (ref 6.0–8.3)
Total Bilirubin: 0.7 mg/dL (ref 0.3–1.2)

## 2014-06-19 LAB — CBC
HEMATOCRIT: 37 % (ref 36.0–46.0)
Hemoglobin: 11.8 g/dL — ABNORMAL LOW (ref 12.0–15.0)
MCH: 30.5 pg (ref 26.0–34.0)
MCHC: 31.9 g/dL (ref 30.0–36.0)
MCV: 95.6 fL (ref 78.0–100.0)
Platelets: 112 10*3/uL — ABNORMAL LOW (ref 150–400)
RBC: 3.87 MIL/uL (ref 3.87–5.11)
RDW: 13.5 % (ref 11.5–15.5)
WBC: 11.1 10*3/uL — ABNORMAL HIGH (ref 4.0–10.5)

## 2014-06-19 SURGERY — ARTHROPLASTY, HIP, TOTAL, ANTERIOR APPROACH
Anesthesia: General | Site: Hip | Laterality: Right

## 2014-06-19 MED ORDER — HYDROGEN PEROXIDE 3 % EX SOLN
CUTANEOUS | Status: AC
  Filled 2014-06-19: qty 473

## 2014-06-19 MED ORDER — BUPIVACAINE-EPINEPHRINE 0.25% -1:200000 IJ SOLN
INTRAMUSCULAR | Status: DC | PRN
  Administered 2014-06-19: 30 mL

## 2014-06-19 MED ORDER — METOCLOPRAMIDE HCL 5 MG/ML IJ SOLN: 5.0000 mg | Freq: Three times a day (TID) | INTRAMUSCULAR | Status: DC | PRN

## 2014-06-19 MED ORDER — SODIUM CHLORIDE 0.9 % IJ SOLN
INTRAMUSCULAR | Status: AC
  Filled 2014-06-19: qty 50

## 2014-06-19 MED ORDER — KETOROLAC TROMETHAMINE 30 MG/ML IJ SOLN
INTRAMUSCULAR | Status: AC
  Filled 2014-06-19: qty 1

## 2014-06-19 MED ORDER — PROPOFOL 10 MG/ML IV BOLUS
INTRAVENOUS | Status: DC | PRN
  Administered 2014-06-19: 50 mg via INTRAVENOUS

## 2014-06-19 MED ORDER — ALBUMIN HUMAN 5 % IV SOLN
INTRAVENOUS | Status: DC | PRN
  Administered 2014-06-19: 14:00:00 via INTRAVENOUS

## 2014-06-19 MED ORDER — PHENYLEPHRINE HCL 10 MG/ML IJ SOLN
INTRAMUSCULAR | Status: AC
  Filled 2014-06-19: qty 2

## 2014-06-19 MED ORDER — LIDOCAINE HCL (CARDIAC) 20 MG/ML IV SOLN
INTRAVENOUS | Status: DC | PRN
  Administered 2014-06-19: 50 mg via INTRAVENOUS

## 2014-06-19 MED ORDER — CEFAZOLIN SODIUM-DEXTROSE 2-3 GM-% IV SOLR
INTRAVENOUS | Status: DC | PRN
Start: 2014-06-19 — End: 2014-06-19
  Administered 2014-06-19: 2 g via INTRAVENOUS

## 2014-06-19 MED ORDER — ONDANSETRON HCL 4 MG/2ML IJ SOLN
INTRAMUSCULAR | Status: AC
  Filled 2014-06-19: qty 2

## 2014-06-19 MED ORDER — TECHNETIUM TC 99M MEBROFENIN IV KIT
5.5000 | PACK | Freq: Once | INTRAVENOUS | Status: AC | PRN
  Administered 2014-06-19: 6 via INTRAVENOUS

## 2014-06-19 MED ORDER — PHENYLEPHRINE HCL 10 MG/ML IJ SOLN
20.0000 mg | INTRAVENOUS | Status: DC | PRN
  Administered 2014-06-19: 50 ug/min via INTRAVENOUS

## 2014-06-19 MED ORDER — SUCCINYLCHOLINE CHLORIDE 20 MG/ML IJ SOLN
INTRAMUSCULAR | Status: DC | PRN
  Administered 2014-06-19: 80 mg via INTRAVENOUS

## 2014-06-19 MED ORDER — ENOXAPARIN SODIUM 30 MG/0.3ML ~~LOC~~ SOLN
30.0000 mg | SUBCUTANEOUS | Status: DC
  Administered 2014-06-20 – 2014-06-24 (×5): 30 mg via SUBCUTANEOUS
  Filled 2014-06-19 (×5): qty 0.3

## 2014-06-19 MED ORDER — SODIUM CHLORIDE 0.9 % IV BOLUS (SEPSIS)
500.0000 mL | Freq: Once | INTRAVENOUS | Status: AC
  Administered 2014-06-19: 500 mL via INTRAVENOUS

## 2014-06-19 MED ORDER — BUPIVACAINE-EPINEPHRINE (PF) 0.25% -1:200000 IJ SOLN
INTRAMUSCULAR | Status: AC
  Filled 2014-06-19: qty 30

## 2014-06-19 MED ORDER — LACTATED RINGERS IV SOLN
INTRAVENOUS | Status: DC | PRN
  Administered 2014-06-19: 14:00:00 via INTRAVENOUS

## 2014-06-19 MED ORDER — LIDOCAINE HCL (CARDIAC) 20 MG/ML IV SOLN
INTRAVENOUS | Status: AC
  Filled 2014-06-19: qty 5

## 2014-06-19 MED ORDER — BUPIVACAINE HCL (PF) 0.25 % IJ SOLN
INTRAMUSCULAR | Status: AC
  Filled 2014-06-19: qty 30

## 2014-06-19 MED ORDER — ACETAMINOPHEN 650 MG RE SUPP: 650.0000 mg | Freq: Four times a day (QID) | RECTAL | Status: DC | PRN

## 2014-06-19 MED ORDER — GLYCOPYRROLATE 0.2 MG/ML IJ SOLN
INTRAMUSCULAR | Status: AC
  Filled 2014-06-19: qty 1

## 2014-06-19 MED ORDER — KETOROLAC TROMETHAMINE 30 MG/ML IJ SOLN
INTRAMUSCULAR | Status: DC | PRN
  Administered 2014-06-19: 30 mg via INTRAMUSCULAR

## 2014-06-19 MED ORDER — ONDANSETRON HCL 4 MG/2ML IJ SOLN
INTRAMUSCULAR | Status: DC | PRN
  Administered 2014-06-19: 4 mg via INTRAVENOUS

## 2014-06-19 MED ORDER — DEXAMETHASONE SODIUM PHOSPHATE 10 MG/ML IJ SOLN
INTRAMUSCULAR | Status: DC | PRN
  Administered 2014-06-19: 10 mg via INTRAVENOUS

## 2014-06-19 MED ORDER — NEOSTIGMINE METHYLSULFATE 10 MG/10ML IV SOLN
INTRAVENOUS | Status: AC
  Filled 2014-06-19: qty 1

## 2014-06-19 MED ORDER — SODIUM CHLORIDE 0.9 % IR SOLN
Status: DC | PRN
  Administered 2014-06-19: 1000 mL

## 2014-06-19 MED ORDER — LABETALOL HCL 5 MG/ML IV SOLN
5.0000 mg | INTRAVENOUS | Status: DC | PRN
  Administered 2014-06-19: 5 mg via INTRAVENOUS

## 2014-06-19 MED ORDER — CISATRACURIUM BESYLATE 20 MG/10ML IV SOLN
INTRAVENOUS | Status: AC
  Filled 2014-06-19: qty 10

## 2014-06-19 MED ORDER — CISATRACURIUM BESYLATE (PF) 10 MG/5ML IV SOLN
INTRAVENOUS | Status: DC | PRN
  Administered 2014-06-19: 4 mg via INTRAVENOUS
  Administered 2014-06-19: 2 mg via INTRAVENOUS

## 2014-06-19 MED ORDER — GLYCOPYRROLATE 0.2 MG/ML IJ SOLN
INTRAMUSCULAR | Status: DC | PRN
  Administered 2014-06-19: .3 mg via INTRAVENOUS

## 2014-06-19 MED ORDER — FENTANYL CITRATE 0.05 MG/ML IJ SOLN
INTRAMUSCULAR | Status: AC
  Filled 2014-06-19: qty 5

## 2014-06-19 MED ORDER — MENTHOL 3 MG MT LOZG
1.0000 | LOZENGE | OROMUCOSAL | Status: DC | PRN
  Filled 2014-06-19: qty 9

## 2014-06-19 MED ORDER — ONDANSETRON HCL 4 MG/2ML IJ SOLN: 4.0000 mg | Freq: Four times a day (QID) | INTRAMUSCULAR | Status: DC | PRN

## 2014-06-19 MED ORDER — CEFAZOLIN SODIUM-DEXTROSE 2-3 GM-% IV SOLR
2.0000 g | Freq: Four times a day (QID) | INTRAVENOUS | Status: AC
  Administered 2014-06-19 – 2014-06-20 (×2): 2 g via INTRAVENOUS
  Filled 2014-06-19 (×2): qty 50

## 2014-06-19 MED ORDER — TRANEXAMIC ACID 100 MG/ML IV SOLN
2000.0000 mg | Freq: Once | INTRAVENOUS | Status: AC
  Administered 2014-06-19: 2000 mg via TOPICAL
  Filled 2014-06-19: qty 20

## 2014-06-19 MED ORDER — PHENYLEPHRINE HCL 10 MG/ML IJ SOLN
INTRAMUSCULAR | Status: DC | PRN
  Administered 2014-06-19 (×2): 40 ug via INTRAVENOUS

## 2014-06-19 MED ORDER — ACETAMINOPHEN 10 MG/ML IV SOLN
500.0000 mg | Freq: Once | INTRAVENOUS | Status: AC
  Administered 2014-06-19: 500 mg via INTRAVENOUS
  Filled 2014-06-19 (×2): qty 50

## 2014-06-19 MED ORDER — FENTANYL CITRATE 0.05 MG/ML IJ SOLN
INTRAMUSCULAR | Status: DC | PRN
  Administered 2014-06-19 (×2): 25 ug via INTRAVENOUS
  Administered 2014-06-19: 40 ug via INTRAVENOUS
  Administered 2014-06-19: 25 ug via INTRAVENOUS
  Administered 2014-06-19: 10 ug via INTRAVENOUS

## 2014-06-19 MED ORDER — SODIUM CHLORIDE 0.9 % IJ SOLN
INTRAMUSCULAR | Status: DC | PRN
  Administered 2014-06-19: 30 mL

## 2014-06-19 MED ORDER — PROPOFOL 10 MG/ML IV BOLUS
INTRAVENOUS | Status: AC
  Filled 2014-06-19: qty 20

## 2014-06-19 MED ORDER — ONDANSETRON HCL 4 MG PO TABS: 4.0000 mg | ORAL_TABLET | Freq: Four times a day (QID) | ORAL | Status: DC | PRN

## 2014-06-19 MED ORDER — ACETAMINOPHEN 325 MG PO TABS: 650.0000 mg | ORAL_TABLET | Freq: Four times a day (QID) | ORAL | Status: DC | PRN

## 2014-06-19 MED ORDER — ISOPROPYL ALCOHOL 70 % SOLN
Status: AC
  Filled 2014-06-19: qty 480

## 2014-06-19 MED ORDER — DEXAMETHASONE SODIUM PHOSPHATE 10 MG/ML IJ SOLN
INTRAMUSCULAR | Status: AC
  Filled 2014-06-19: qty 1

## 2014-06-19 MED ORDER — LABETALOL HCL 5 MG/ML IV SOLN
INTRAVENOUS | Status: AC
  Filled 2014-06-19: qty 4

## 2014-06-19 MED ORDER — HYDROGEN PEROXIDE 3 % EX SOLN
CUTANEOUS | Status: DC | PRN
  Administered 2014-06-19: 1 via TOPICAL

## 2014-06-19 MED ORDER — NEOSTIGMINE METHYLSULFATE 10 MG/10ML IV SOLN
INTRAVENOUS | Status: DC | PRN
  Administered 2014-06-19: 2.5 mg via INTRAVENOUS

## 2014-06-19 MED ORDER — LABETALOL HCL 5 MG/ML IV SOLN: 10.0000 mg | INTRAVENOUS | Status: DC | PRN

## 2014-06-19 MED ORDER — PHENOL 1.4 % MT LIQD: 1.0000 | OROMUCOSAL | Status: DC | PRN

## 2014-06-19 MED ORDER — PHENYLEPHRINE 40 MCG/ML (10ML) SYRINGE FOR IV PUSH (FOR BLOOD PRESSURE SUPPORT)
PREFILLED_SYRINGE | INTRAVENOUS | Status: AC
  Filled 2014-06-19: qty 10

## 2014-06-19 MED ORDER — METOCLOPRAMIDE HCL 10 MG PO TABS: 5.0000 mg | ORAL_TABLET | Freq: Three times a day (TID) | ORAL | Status: DC | PRN

## 2014-06-19 SURGICAL SUPPLY — 45 items
BAG DECANTER FOR FLEXI CONT (MISCELLANEOUS) ×3 IMPLANT
BAG ZIPLOCK 12X15 (MISCELLANEOUS) IMPLANT
BNDG COHESIVE 4X5 TAN STRL (GAUZE/BANDAGES/DRESSINGS) ×3 IMPLANT
CAPT HIP HEMI 2 ×3 IMPLANT
CHLORAPREP W/TINT 26ML (MISCELLANEOUS) ×3 IMPLANT
COVER PERINEAL POST (MISCELLANEOUS) ×3 IMPLANT
DECANTER SPIKE VIAL GLASS SM (MISCELLANEOUS) ×3 IMPLANT
DRAPE C-ARM 42X120 X-RAY (DRAPES) ×3 IMPLANT
DRAPE SHEET LG 3/4 BI-LAMINATE (DRAPES) ×3 IMPLANT
DRAPE STERI IOBAN 125X83 (DRAPES) ×3 IMPLANT
DRAPE U-SHAPE 47X51 STRL (DRAPES) ×9 IMPLANT
DRSG AQUACEL AG ADV 3.5X10 (GAUZE/BANDAGES/DRESSINGS) ×3 IMPLANT
ELECT BLADE TIP CTD 4 INCH (ELECTRODE) ×3 IMPLANT
ELECT PENCIL ROCKER SW 15FT (MISCELLANEOUS) ×3 IMPLANT
ELECT REM PT RETURN 15FT ADLT (MISCELLANEOUS) ×3 IMPLANT
FACESHIELD WRAPAROUND (MASK) ×6 IMPLANT
GAUZE SPONGE 4X4 12PLY STRL (GAUZE/BANDAGES/DRESSINGS) ×3 IMPLANT
GLOVE BIO SURGEON STRL SZ8.5 (GLOVE) ×6 IMPLANT
GLOVE BIOGEL PI IND STRL 8.5 (GLOVE) ×2 IMPLANT
GLOVE BIOGEL PI INDICATOR 8.5 (GLOVE) ×4
GOWN SPEC L3 XXLG W/TWL (GOWN DISPOSABLE) ×3 IMPLANT
HANDPIECE INTERPULSE COAX TIP (DISPOSABLE) ×2
HOOD PEEL AWAY FACE SHEILD DIS (HOOD) ×6 IMPLANT
KIT BASIN OR (CUSTOM PROCEDURE TRAY) ×3 IMPLANT
LIQUID BAND (GAUZE/BANDAGES/DRESSINGS) ×6 IMPLANT
NEEDLE SPNL 18GX3.5 QUINCKE PK (NEEDLE) ×6 IMPLANT
PACK TOTAL JOINT (CUSTOM PROCEDURE TRAY) ×3 IMPLANT
PEN SKIN MARKING BROAD (MISCELLANEOUS) ×3 IMPLANT
SAW OSC TIP CART 19.5X105X1.3 (SAW) ×3 IMPLANT
SEALER BIPOLAR AQUA 6.0 (INSTRUMENTS) ×3 IMPLANT
SET HNDPC FAN SPRY TIP SCT (DISPOSABLE) ×1 IMPLANT
SOL PREP POV-IOD 4OZ 10% (MISCELLANEOUS) ×3 IMPLANT
SUT ETHIBOND NAB CT1 #1 30IN (SUTURE) ×6 IMPLANT
SUT MNCRL AB 3-0 PS2 18 (SUTURE) ×3 IMPLANT
SUT MON AB 2-0 CT1 36 (SUTURE) ×6 IMPLANT
SUT VIC AB 0 CT1 36 (SUTURE) ×3 IMPLANT
SUT VIC AB 1 CT1 36 (SUTURE) ×3 IMPLANT
SUT VIC AB 2-0 CT1 27 (SUTURE) ×2
SUT VIC AB 2-0 CT1 TAPERPNT 27 (SUTURE) ×1 IMPLANT
SUT VLOC 180 0 24IN GS25 (SUTURE) ×3 IMPLANT
SYR 50ML LL SCALE MARK (SYRINGE) ×3 IMPLANT
TOWEL OR 17X26 10 PK STRL BLUE (TOWEL DISPOSABLE) ×3 IMPLANT
TOWEL OR NON WOVEN STRL DISP B (DISPOSABLE) ×3 IMPLANT
WATER STERILE IRR 1500ML POUR (IV SOLUTION) ×3 IMPLANT
YANKAUER SUCT BULB TIP 10FT TU (MISCELLANEOUS) ×3 IMPLANT

## 2014-06-19 NOTE — Interval H&P Note (Signed)
History and Physical Interval Note:  06/19/2014 1:54 PM  Carrie Moran  has presented today for surgery, with the diagnosis of fractured right hip  The various methods of treatment have been discussed with the patient and family. After consideration of risks, benefits and other options for treatment, the patient has consented to  Procedure(s): RIGHT HIP HEMI VERSUS TOTAL HIP ARTHROPLASTY ANTERIOR APPROACH (Right) as a surgical intervention .  The patient's history has been reviewed, patient examined, no change in status, stable for surgery.  I have reviewed the patient's chart and labs.  Questions were answered to the patient's satisfaction.    HIDA scan negative for cholecystitis. Abd pain markedly improved after several BM's.   Jesscia Imm, Horald Pollen

## 2014-06-19 NOTE — Anesthesia Procedure Notes (Signed)
Procedure Name: Intubation Date/Time: 06/19/2014 2:23 PM Performed by: Carrie Moran A Pre-anesthesia Checklist: Patient identified, Timeout performed, Emergency Drugs available, Suction available and Patient being monitored Patient Re-evaluated:Patient Re-evaluated prior to inductionOxygen Delivery Method: Circle system utilized Preoxygenation: Pre-oxygenation with 100% oxygen Intubation Type: IV induction Ventilation: Mask ventilation without difficulty Laryngoscope Size: Mac and 3 Grade View: Grade I Tube type: Oral Tube size: 7.0 mm Number of attempts: 1 Airway Equipment and Method: Stylet Placement Confirmation: breath sounds checked- equal and bilateral,  ETT inserted through vocal cords under direct vision and positive ETCO2 Secured at: 20 cm Tube secured with: Tape Dental Injury: Teeth and Oropharynx as per pre-operative assessment

## 2014-06-19 NOTE — Progress Notes (Signed)
Patient's output was 100 ml over the past 6 hours. Urine is tea colored from the foley. PCP was notified. New orders were given.

## 2014-06-19 NOTE — Transfer of Care (Signed)
Immediate Anesthesia Transfer of Care Note  Patient: Carrie Moran  Procedure(s) Performed: Procedure(s): RIGHT HIP HEMI ANTERIOR APPROACH (Right)  Patient Location: PACU  Anesthesia Type:General  Level of Consciousness: awake, pateint uncooperative, confused, lethargic and responds to stimulation  Airway & Oxygen Therapy: Patient Spontanous Breathing and Patient connected to face mask oxygen  Post-op Assessment: Report given to RN, Post -op Vital signs reviewed and stable and Patient moving all extremities  Post vital signs: Reviewed and stable  Last Vitals:  Filed Vitals:   06/19/14 0635  BP: 115/64  Pulse: 72  Temp: 36.4 C  Resp: 15    Complications: No apparent anesthesia complications

## 2014-06-19 NOTE — Progress Notes (Signed)
PROGRESS NOTE  Carrie Moran SUP:[id number] DOB: 1917/11/15 DOA: 06/17/2014 PCP: Blanchie Serve, MD  Assessment/Plan:  Hip fracture, right, requiring operative repair Preoperative risk assessment / clearance  No angina, dyspnea, syncope, and palpitations, history of heart disease including ischemic, valvular, or myopathic disease, no history of diabetes, or cerebrovascular disease. She does have a history of hypertension, chronic kidney disease and peripheral artery disease.   Cardiac functional status : 1 MET (Can take care of self, such as eat, dress, or use the toilet (1 MET)  Preoperative ECG reviewed and shows possible prior ischemic event. Anteroseptal Q waves noted.  She is at moderate risk for perioperative MI given her elevated troponins, abnormal EKG, and comorbidities, but may wish to proceed with surgical intervention despite known risks to preserve her mobility.  Cardiology following--appreciated  Surgery tentatively planned for 06/19/14  HIDA neg for cholecystitis   Leukocytosis / transaminitis  - May have component of stress demargination  - Abd US--distended gallbladder with 2.6 m stone, but difficult to assess gallbladder  - HIDA scan--neg  Chest x-ray clear. Urinalysis negative for nitrites with small leukocytes and negative bacteria.  06/18/14--CT abdomen--R-side hydronephrosis @ UPJ, mild fluid and air distension on sm+lg bowel, rectal stool mass   Afebrile and hemodynamically stable  Acute on Chronic Stage III chronic kidney disease/Hydronephrosis R-renal  Creatinine 1.2-1.6 at baseline. Current creatinine 1.7  Due to dehyddration, although unclear if hydronephrosis has any contribution  R-hydronephrosis present on previous CTs dating back to 01/30/10  Consult urology for opinion  D/c diclofenac gel   Constipation / Abdominal distention  Check KUB--constipation  Fleets enema-->large BM, but still with  abdominal pain  06/19/14--had another large BM   Essential hypertension, benign  Continue Norvasc, metoprolol and clonidine.   Glaucoma  Continue Xalatan.   Elevated troponin/demand ischemia  Cardiology following.  2-D echo shows EF 65-70 percent with grade 1 diastolic dysfunction. No significant changes from prior 2-D echo, supporting a diagnosis of demand ischemia. Given this, we'll hold aspirin in anticipation of surgery.   Peripheral vascular disease   Continue Pletal.      DVT prophylaxis  Lovenox ordered.  Code Status: DNR. Family Communication: UpdatedSon at the bedside 06/19/14 Disposition Plan: Home when stable.         Procedures/Studies: Ct Abdomen Pelvis Wo Contrast  06/19/2014   CLINICAL DATA:  Abdominal pain.  Leukocytosis.  EXAM: CT ABDOMEN AND PELVIS WITHOUT CONTRAST  TECHNIQUE: Multidetector CT imaging of the abdomen and pelvis was performed following the standard protocol without IV contrast.  COMPARISON:  06/17/2014 radiographs  FINDINGS: There are unremarkable unenhanced appearances of the liver, spleen, and pancreas. There is a 2.0 cm calculus within the gallbladder lumen. There is no bile duct dilatation. The right kidney is hydronephrotic, with abrupt caliber transition at the ureteropelvic junction. This may represent a chronic UPJ obstruction. No obstructing calculus or mass is evident. The left kidney is normal. There is no evidence of bowel obstruction or extraluminal air. There is mild fluid and air distention of small and large bowel without caliber transition. There is a prominent rectal stool mass which may represent a degree of fecal impaction. There is colonic diverticulosis. There is no evidence of diverticulitis or other focal acute inflammatory process in the abdomen or pelvis. There is no ascites.  In the lower chest there are small bilateral effusions, right greater than left. Lung bases are clear.  There is a subcapital right  hip  fracture. No other acute musculoskeletal abnormalities are evident. Moderately severe degenerative disc and facet changes are present from L2 through the sacrum.  IMPRESSION: 1. Cholelithiasis 2. Right hydronephrosis, perhaps a chronic UPJ obstruction. No obstructing mass or calculus is evident. 3. Diverticulosis 4. The rectum is distended with stool, raising the question of fecal impaction. There is moderate distension of all of the small and large bowel without caliber transition. 5. Small bilateral pleural effusions 6. Subcapital right hip fracture   Electronically Signed   By: Andreas Newport M.D.   On: 06/19/2014 03:11   Dg Chest 1 View  06/17/2014   CLINICAL DATA:  Pain following fall  EXAM: CHEST  1 VIEW  COMPARISON:  October 12, 2013  FINDINGS: There is no edema or consolidation. Heart is slightly enlarged with pulmonary vascularity within normal limits. No adenopathy. There is atherosclerotic change in the aorta. No pneumothorax. No acute fracture. There is extensive arthropathy in each shoulder. There is evidence of old trauma involving the left third rib.  IMPRESSION: No edema or consolidation. No change in cardiac silhouette. No pneumothorax.   Electronically Signed   By: Lowella Grip III M.D.   On: 06/17/2014 08:09   Dg Abd 1 View  06/17/2014   CLINICAL DATA:  Abdominal distension. History of renal insufficiency.  EXAM: ABDOMEN - 1 VIEW  COMPARISON:  CT 01/30/2010  FINDINGS: There is a displaced subcapital right femur fracture. Left hip appears intact. Large amount of gas and stool within the abdomen and pelvis. In particular, there is a large amount of gas in the mid abdomen. Limited evaluation for free air on this supine image. Levoscoliosis of the lumbar spine.  IMPRESSION: Large amount of gas and stool throughout the abdomen and pelvis.  Right subcapital femur fracture.   Electronically Signed   By: Markus Daft M.D.   On: 06/17/2014 20:15   Nm Hepatobiliary Liver Func  06/19/2014    CLINICAL DATA:  Cholelithiasis on CT.  Evaluate for cholecystitis.  EXAM: NUCLEAR MEDICINE HEPATOBILIARY IMAGING  TECHNIQUE: Sequential images of the abdomen were obtained out to 60 minutes following intravenous administration of radiopharmaceutical.  RADIOPHARMACEUTICALS:  5.5 [place] [id number] Choletec  COMPARISON:  CT 06/18/2014  FINDINGS: There is prompt clearance radiotracer from the blood pool. Counts are evident within the small bowel by 30 minutes. Counts begin to fill the gallbladder by 18 minutes and continues to fill.  IMPRESSION: Filling of the gallbladder indicates patent cystic duct. No evidence of acute cholecystitis.   Electronically Signed   By: Suzy Bouchard M.D.   On: 06/19/2014 12:03   Dg Hip Unilat With Pelvis 2-3 Views Right  06/17/2014   CLINICAL DATA:  Fall this morning with right hip pain and lower extremity foreshortening. Initial encounter.  EXAM: RIGHT HIP (WITH PELVIS) 2-3 VIEWS  COMPARISON:  None.  FINDINGS: There is evidence of an acute, complete subcapital fracture of the proximal femur with mild displacement. No dislocation. No bony lesions are seen. The bony pelvis is intact.  IMPRESSION: Acute subcapital fracture of the right hip.   Electronically Signed   By: Aletta Edouard M.D.   On: 06/17/2014 08:07   Dg Femur, Min 2 Views Right  06/17/2014   CLINICAL DATA:  Recent fall with known hip fracture  EXAM: RIGHT FEMUR 2 VIEWS  COMPARISON:  None.  FINDINGS: Subcapital femoral neck fracture is noted with some impaction at the fracture site. Degenerative changes about the knee joint are seen. Diffuse vascular calcifications are  noted. No other fractures are seen.  IMPRESSION: Subcapital right femoral neck fracture. No other acute abnormality is seen.   Electronically Signed   By: Inez Catalina M.D.   On: 06/17/2014 12:09   US Abdomen Limited Ruq  06/18/2014   CLINICAL DATA:  Abnormal elevated liver enzymes, elevated creatinine and  EXAM: US ABDOMEN LIMITED - RIGHT UPPER  QUADRANT  COMPARISON:  None.  FINDINGS: Gallbladder:  Distended to at least 7-8 mm. There is a 2.6 cm stone. Wall thickness is within normal limits at 2 mm and there is no Murphy's sign.  Common bile duct:  Diameter: Measures between 3-6 mm  Liver:  1 cm cyst in the left lobe. No biliary dilatation. No focal abnormalities.  IMPRESSION: Study limited by patient in mobility related to hip fracture. Gallbladder is difficult to completely image. There is a gallstone with no Murphy's sign.   Electronically Signed   By: Skipper Cliche M.D.   On: 06/18/2014 13:29        Subjective: Patient is more alert today. Denies any fevers, chills, chest pain, shortness of breath, vomiting, diarrhea, dysuria. She complains of abdominal pain.  Objective: Filed Vitals:   06/18/14 2148 06/19/14 0000 06/19/14 0307 06/19/14 0635  BP: 128/74 102/58 121/64 115/64  Pulse: 72 70 66 72  Temp: 97.7 F (36.5 C)  97.5 F (36.4 C) 97.6 F (36.4 C)  TempSrc: Oral  Oral Oral  Resp: 17  16 15   Height:      Weight:      SpO2: 98%  100% 100%    Intake/Output Summary (Last 24 hours) at 06/19/14 1310 Last data filed at 06/19/14 0308  Gross per 24 hour  Intake    600 ml  Output    325 ml  Net    275 ml   Weight change:  Exam:   General:  Pt is alert, follows commands appropriately, not in acute distress  HEENT: No icterus, No thrush,  [place]/AT  Cardiovascular: RRR, S1/S2, no rubs, no gallops  Respiratory: Bibasilar crackles. No wheeze. Good air movement  Abdomen: Soft/+BS, diffusely tender without rebound, non distended, no guarding  Extremities: No edema, No lymphangitis, No petechiae, No rashes, no synovitis  Data Reviewed: Basic Metabolic Panel:  Recent Labs Lab 06/17/14 0750 06/18/14 0605 06/19/14 0555  NA 139 142 138  K 4.0 4.3 4.2  CL 108 108 109  CO2 20 19 17*  GLUCOSE 265* 120* 98  BUN 34* 48* 65*  CREATININE 1.41* 1.86* 1.79*  CALCIUM 9.1 8.4 7.9*   Liver Function Tests:  Recent  Labs Lab 06/17/14 0750 06/18/14 0605 06/19/14 0555  AST 362* 115* 50*  ALT 244* 157* 95*  ALKPHOS 78 58 54  BILITOT 1.4* 0.8 0.7  PROT 6.2 5.5* 5.4*  ALBUMIN 3.6 3.2* 3.2*   No results for input(s): LIPASE, AMYLASE in the last 168 hours. No results for input(s): AMMONIA in the last 168 hours. CBC:  Recent Labs Lab 06/17/14 0750 06/19/14 0555  WBC 22.2* 11.1*  NEUTROABS 17.8*  --   HGB 13.9 11.8*  HCT 45.1 37.0  MCV 97.2 95.6  PLT 160 112*   Cardiac Enzymes:  Recent Labs Lab 06/18/14 0704 06/18/14 1242 06/18/14 1859  TROPONINI 1.55* 1.45* 0.96*   BNP: Invalid input(s): POCBNP CBG: No results for input(s): GLUCAP in the last 168 hours.  Recent Results (from the past 240 hour(s))  MRSA PCR Screening     Status: None   Collection Time: 06/17/14  1:00 PM  Result Value Ref Range Status   MRSA by PCR NEGATIVE NEGATIVE Final    Comment:        The GeneXpert MRSA Assay (FDA approved for NASAL specimens only), is one component of a comprehensive MRSA colonization surveillance program. It is not intended to diagnose MRSA infection nor to guide or monitor treatment for MRSA infections.      Scheduled Meds: . amLODipine  10 mg Oral Daily  . cilostazol  50 mg Oral BID  . cloNIDine  0.1 mg Oral BID  . diclofenac sodium  2 g Topical BID  . enoxaparin (LOVENOX) injection  30 mg Subcutaneous Q24H  . gabapentin  100 mg Oral QHS  . latanoprost  1 drop Both Eyes Daily  . metoprolol tartrate  25 mg Oral BID  . mirabegron ER  25 mg Oral Daily  . pantoprazole  40 mg Oral Daily  . polyethylene glycol  17 g Oral Daily  . timolol  1 drop Both Eyes Daily  . topiramate  25 mg Oral BID   Continuous Infusions: . sodium chloride 100 mL/hr at 06/19/14 0659     Carrie Funderburke, DO  [facility] Pager [phone]  If 7PM-7AM, please contact night-coverage [email or web address] Password [email or web address] 06/19/2014, 1:10 PM   LOS: 2 days

## 2014-06-19 NOTE — H&P (View-Only) (Signed)
ORTHOPAEDIC CONSULTATION  REQUESTING PHYSICIAN: Venetia Maxon Rama, MD  PCP:  Blanchie Serve, MD  Chief Complaint: right hip pain  HPI: Carrie Moran is a 81 y.o. female who complains of  Right hip pain. She states that she got dizzy and fell this morning while combing her hair. She was unable to weight bear. She was brought to the ED, where workup revealed a displaced femoral neck fracture. She was admitted by the hospitalist, and cardiology is evaluating her. She is a household ambulator with a walker.  Past Medical History  Diagnosis Date  . Hypertension   . Memory loss   . GERD (gastroesophageal reflux disease)   . RLS (restless legs syndrome)   . Hypertonicity of bladder   . Renal insufficiency   . Other headache syndromes(339.89)   . Osteoporosis, unspecified   . Urinary frequency   . Accelerated hypertension 07/09/2013  . Stage III chronic kidney disease 06/17/2014  . Hip fracture requiring operative repair 06/17/2014  . Primary osteoarthritis involving multiple joints 09/24/2013  . PVD (peripheral vascular disease) 11/07/2012  . Glaucoma 11/07/2012  . Depression 11/07/2012  . Overactive bladder 10/17/2012   Past Surgical History  Procedure Laterality Date  . Appendectomy  1930  . Rotator cuff repair  1986  . Rotator cuff repair  2001  . Carpal tunnel release  2006    Bilateral    History   Social History  . Marital Status: Widowed    Spouse Name: N/A  . Number of Children: N/A  . Years of Education: N/A   Social History Main Topics  . Smoking status: Former Research scientist (life sciences)  . Smokeless tobacco: Never Used  . Alcohol Use: No  . Drug Use: No  . Sexual Activity: Not on file   Other Topics Concern  . None   Social History Narrative   Lives at [facility] [place]   Widowed   Former smoker   Alcohol none   Exercise does chair exercise once a day 5 days a week            Family History  Problem Relation Age of Onset  . Heart attack Mother   . Cancer Brother   .  Cancer Brother    Allergies  Allergen Reactions  . Sulfa Antibiotics     Unknown reaction.    Prior to Admission medications   Medication Sig Start Date End Date Taking? Authorizing Provider  amLODipine (NORVASC) 10 MG tablet Take one tablet once daily to control Blood pressure   Yes Historical Provider, MD  aspirin 81 MG tablet Take 81 mg by mouth daily.   Yes Historical Provider, MD  cilostazol (PLETAL) 50 MG tablet Take 50 mg by mouth 2 (two) times daily.   Yes Historical Provider, MD  cloNIDine (CATAPRES) 0.1 MG tablet Take 1 tablet (0.1 mg total) by mouth 2 (two) times daily. 10/24/12  Yes Mahima Bubba Camp, MD  diclofenac sodium (VOLTAREN) 1 % GEL Apply 2 g topically 2 (two) times daily. 09/24/13  Yes Mahima Bubba Camp, MD  gabapentin (NEURONTIN) 100 MG capsule Take 1 capsule (100 mg total) by mouth at bedtime. 06/06/14  Yes Monica Carter, DO  latanoprost (XALATAN) 0.005 % ophthalmic solution Place 1 drop into both eyes daily. Instill 1 drop in each eye once daily for glaucoma.   Yes Historical Provider, MD  metoprolol tartrate (LOPRESSOR) 25 MG tablet Take 1 tablet (25 mg total) by mouth 2 (two) times daily. 07/09/13  Yes Blanchie Serve, MD  mirabegron  ER (MYRBETRIQ) 25 MG TB24 tablet Take 1 tablet (25 mg total) by mouth daily. 09/24/13  Yes Mahima Bubba Camp, MD  omeprazole (PRILOSEC) 20 MG capsule Take 1 capsule (20 mg total) by mouth daily. 09/24/13  Yes Mahima Pandey, MD  senna-docusate (SENOKOT-S) 8.6-50 MG per tablet Take 2 tablets by mouth daily as needed for mild constipation.   Yes Historical Provider, MD  Timolol Maleate 0.5 % (DAILY) SOLN Place 1 drop into both eyes daily.    Yes Historical Provider, MD  topiramate (TOPAMAX) 25 MG tablet Take 1 tablet (25 mg total) by mouth 2 (two) times daily. 11/07/12  Yes Blanchie Serve, MD  traMADol (ULTRAM) 50 MG tablet Take one tablet by mouth once daily for pain 04/17/14  Yes Lauree Chandler, NP  AMBULATORY NON FORMULARY MEDICATION Ted compression stockings  for both legs- wear them in the morning and remove it before going to bed    Historical Provider, MD  mirtazapine (REMERON) 15 MG tablet Take 0.5 tablets (7.5 mg total) by mouth at bedtime. Take half a tablet once a day for one week and then stop it Patient not taking: Reported on 06/17/2014 11/26/13   Blanchie Serve, MD   Dg Chest 1 View  06/17/2014   CLINICAL DATA:  Pain following fall  EXAM: CHEST  1 VIEW  COMPARISON:  October 12, 2013  FINDINGS: There is no edema or consolidation. Heart is slightly enlarged with pulmonary vascularity within normal limits. No adenopathy. There is atherosclerotic change in the aorta. No pneumothorax. No acute fracture. There is extensive arthropathy in each shoulder. There is evidence of old trauma involving the left third rib.  IMPRESSION: No edema or consolidation. No change in cardiac silhouette. No pneumothorax.   Electronically Signed   By: Lowella Grip III M.D.   On: 06/17/2014 08:09   Dg Hip Unilat With Pelvis 2-3 Views Right  06/17/2014   CLINICAL DATA:  Fall this morning with right hip pain and lower extremity foreshortening. Initial encounter.  EXAM: RIGHT HIP (WITH PELVIS) 2-3 VIEWS  COMPARISON:  None.  FINDINGS: There is evidence of an acute, complete subcapital fracture of the proximal femur with mild displacement. No dislocation. No bony lesions are seen. The bony pelvis is intact.  IMPRESSION: Acute subcapital fracture of the right hip.   Electronically Signed   By: Aletta Edouard M.D.   On: 06/17/2014 08:07   Dg Femur, Min 2 Views Right  06/17/2014   CLINICAL DATA:  Recent fall with known hip fracture  EXAM: RIGHT FEMUR 2 VIEWS  COMPARISON:  None.  FINDINGS: Subcapital femoral neck fracture is noted with some impaction at the fracture site. Degenerative changes about the knee joint are seen. Diffuse vascular calcifications are noted. No other fractures are seen.  IMPRESSION: Subcapital right femoral neck fracture. No other acute abnormality is seen.    Electronically Signed   By: Inez Catalina M.D.   On: 06/17/2014 12:09    Positive ROS: All other systems have been reviewed and were otherwise negative with the exception of those mentioned in the HPI and as above.  Physical Exam: General: Alert, no acute distress Cardiovascular: No pedal edema Respiratory: No cyanosis, no use of accessory musculature GI: No organomegaly, abdomen is distended and mildly tender Skin: No lesions in the area of chief complaint Neurologic: Sensation intact distally Lymphatic: No axillary or cervical lymphadenopathy  MUSCULOSKELETAL:  RLE: shortened and externally rotated. Pain with ROM. +TA/GS/EHL. 2+ DP. SILT. BUE: no trauma. Painless ROM. LLE:  able to SLR. Painless ROM.  Assessment: Displaced right femoral neck fracture  Plan: I discussed the findings with the patient and her family. Given her pain and inability to mobilize, I recommended right hip hemiarthroplasty vs THA. The risks, benefits, and alternatives were discussed with the patient and family. There are risks associated with the surgery including, but not limited to, problems with anesthesia (death), infection, dislocation, instability (giving out of the joint), dislocation, differences in leg length/angulation/rotation, fracture of bones, loosening or failure of implants, hematoma (blood accumulation) which may require surgical drainage, blood clots, pulmonary embolism, nerve injury (foot drop), and blood vessel injury. They understand that she has about a 30% one year mortality rate. The family understands these risks and elects to proceed. She is posted for Thursday pending cardiology clearance.     Cinch Ormond, Horald Pollen, MD Cell [phone]    06/17/2014 5:58 PM

## 2014-06-19 NOTE — Consult Note (Signed)
Urology Consult  Referring physician:Dr. Tat Reason for referral: Hydronephrosis with CRF.  History of Present Illness: Carrie Moran is a 81 year old female with a long-standing history of chronic right hydronephrosis secondary to a UPJ obstruction. She is had a multitude of ultrasounds and CT scans  over the last 8 to 10 years which of shown varying degrees a mild to moderate degrees of  hydronephrosis.  She is currently in the recovery room status post hip surgery. According to her orthopedic surgeon and she has severe dementia and is unable to provide any type of meaningful history. She does have some progressive chronic renal failure.  Past Medical History  Diagnosis Date  . Hypertension   . Memory loss   . GERD (gastroesophageal reflux disease)   . RLS (restless legs syndrome)   . Hypertonicity of bladder   . Renal insufficiency   . Other headache syndromes(339.89)   . Osteoporosis, unspecified   . Urinary frequency   . Accelerated hypertension 07/09/2013  . Stage III chronic kidney disease 06/17/2014  . Hip fracture requiring operative repair 06/17/2014  . Primary osteoarthritis involving multiple joints 09/24/2013  . PVD (peripheral vascular disease) 11/07/2012  . Glaucoma 11/07/2012  . Depression 11/07/2012  . Overactive bladder 10/17/2012   Past Surgical History  Procedure Laterality Date  . Appendectomy  1930  . Rotator cuff repair  1986  . Rotator cuff repair  2001  . Carpal tunnel release  2006    Bilateral     Medications:  Scheduled: . amLODipine  10 mg Oral Daily  . cilostazol  50 mg Oral BID  . cloNIDine  0.1 mg Oral BID  . enoxaparin (LOVENOX) injection  30 mg Subcutaneous Q24H  . gabapentin  100 mg Oral QHS  . latanoprost  1 drop Both Eyes Daily  . metoprolol tartrate  25 mg Oral BID  . mirabegron ER  25 mg Oral Daily  . pantoprazole  40 mg Oral Daily  . polyethylene glycol  17 g Oral Daily  . timolol  1 drop Both Eyes Daily  . topiramate  25 mg Oral BID     Allergies:  Allergies  Allergen Reactions  . Sulfa Antibiotics     Unknown reaction.     Family History  Problem Relation Age of Onset  . Heart attack Mother   . Cancer Brother   . Cancer Brother     Social History:  reports that she has quit smoking. She has never used smokeless tobacco. She reports that she does not drink alcohol or use illicit drugs.  ROS Not obtainable  Physical Exam:  Vital signs in last 24 hours: Temp:  [97.5 F (36.4 C)-98.4 F (36.9 C)] 97.9 F (36.6 C) (03/17 1744) Pulse Rate:  [66-119] 76 (03/17 1744) Resp:  [15-24] 18 (03/17 2000) BP: (102-216)/(58-130) 139/89 mmHg (03/17 2105) SpO2:  [97 %-100 %] 100 % (03/17 2000)  Constitutional: Vital signs reviewed. Currently s/p general surgery in PACU Head: Normocephalic and atraumatic   Eyes: PERRL, No scleral icterus.  Neck: Supple No  Gross JVD Cardiovascular: RRR Pulmonary/Chest: Normal effort Abdominal: Soft. Non-tender, non-distended Genitourinary:not examined Extremities: No cyanosis or edema  Neurological: Grossly non-focal.  Skin: Warm,very dry and intact. No rash, cyanosis   Laboratory Data:  Results for orders placed or performed during the hospital encounter of 06/17/14 (from the past 72 hour(s))  CBC with Differential     Status: Abnormal   Collection Time: 06/17/14  7:50 AM  Result Value Ref Range  WBC 22.2 (H) 4.0 - 10.5 K/uL   RBC 4.64 3.87 - 5.11 MIL/uL   Hemoglobin 13.9 12.0 - 15.0 g/dL   HCT 45.1 36.0 - 46.0 %   MCV 97.2 78.0 - 100.0 fL   MCH 30.0 26.0 - 34.0 pg   MCHC 30.8 30.0 - 36.0 g/dL   RDW 13.0 11.5 - 15.5 %   Platelets 160 150 - 400 K/uL   Neutrophils Relative % 80 (H) 43 - 77 %   Neutro Abs 17.8 (H) 1.7 - 7.7 K/uL   Lymphocytes Relative 12 12 - 46 %   Lymphs Abs 2.7 0.7 - 4.0 K/uL   Monocytes Relative 7 3 - 12 %   Monocytes Absolute 1.6 (H) 0.1 - 1.0 K/uL   Eosinophils Relative 1 0 - 5 %   Eosinophils Absolute 0.1 0.0 - 0.7 K/uL   Basophils Relative 0  0 - 1 %   Basophils Absolute 0.0 0.0 - 0.1 K/uL  Comprehensive metabolic panel     Status: Abnormal   Collection Time: 06/17/14  7:50 AM  Result Value Ref Range   Sodium 139 135 - 145 mmol/L   Potassium 4.0 3.5 - 5.1 mmol/L   Chloride 108 96 - 112 mmol/L   CO2 20 19 - 32 mmol/L   Glucose, Bld 265 (H) 70 - 99 mg/dL   BUN 34 (H) 6 - 23 mg/dL   Creatinine, Ser 1.41 (H) 0.50 - 1.10 mg/dL   Calcium 9.1 8.4 - 10.5 mg/dL   Total Protein 6.2 6.0 - 8.3 g/dL   Albumin 3.6 3.5 - 5.2 g/dL   AST 362 (H) 0 - 37 U/L   ALT 244 (H) 0 - 35 U/L   Alkaline Phosphatase 78 39 - 117 U/L   Total Bilirubin 1.4 (H) 0.3 - 1.2 mg/dL   GFR calc non Af Amer 31 (L) >90 mL/min   GFR calc Af Amer 36 (L) >90 mL/min    Comment: (NOTE) The eGFR has been calculated using the CKD EPI equation. This calculation has not been validated in all clinical situations. eGFR's persistently <90 mL/min signify possible Chronic Kidney Disease.    Anion gap 11 5 - 15  Protime-INR     Status: Abnormal   Collection Time: 06/17/14  7:50 AM  Result Value Ref Range   Prothrombin Time 17.0 (H) 11.6 - 15.2 seconds   INR 1.37 0.00 - 1.49  I-stat troponin, ED     Status: Abnormal   Collection Time: 06/17/14  8:28 AM  Result Value Ref Range   Troponin i, poc 0.49 (HH) 0.00 - 0.08 ng/mL   Comment NOTIFIED PHYSICIAN    Comment 3            Comment: Due to the release kinetics of cTnI, a negative result within the first hours of the onset of symptoms does not rule out myocardial infarction with certainty. If myocardial infarction is still suspected, repeat the test at appropriate intervals.   Urinalysis, Routine w reflex microscopic     Status: Abnormal   Collection Time: 06/17/14  8:47 AM  Result Value Ref Range   Color, Urine YELLOW YELLOW   APPearance CLEAR CLEAR   Specific Gravity, Urine 1.010 1.005 - 1.030   pH 6.0 5.0 - 8.0   Glucose, UA NEGATIVE NEGATIVE mg/dL   Hgb urine dipstick NEGATIVE NEGATIVE   Bilirubin Urine  NEGATIVE NEGATIVE   Ketones, ur NEGATIVE NEGATIVE mg/dL   Protein, ur NEGATIVE NEGATIVE mg/dL   Urobilinogen,  UA 0.2 0.0 - 1.0 mg/dL   Nitrite NEGATIVE NEGATIVE   Leukocytes, UA TRACE (A) NEGATIVE  Urine microscopic-add on     Status: None   Collection Time: 06/17/14  8:47 AM  Result Value Ref Range   Squamous Epithelial / LPF RARE RARE   WBC, UA 0-2 <3 WBC/hpf   RBC / HPF 0-2 <3 RBC/hpf  Type and screen     Status: None   Collection Time: 06/17/14  8:48 AM  Result Value Ref Range   ABO/RH(D) A NEG    Antibody Screen NEG    Sample Expiration 06/20/2014   ABO/Rh     Status: None   Collection Time: 06/17/14  8:48 AM  Result Value Ref Range   ABO/RH(D) A NEG   MRSA PCR Screening     Status: None   Collection Time: 06/17/14  1:00 PM  Result Value Ref Range   MRSA by PCR NEGATIVE NEGATIVE    Comment:        The GeneXpert MRSA Assay (FDA approved for NASAL specimens only), is one component of a comprehensive MRSA colonization surveillance program. It is not intended to diagnose MRSA infection nor to guide or monitor treatment for MRSA infections.   Comprehensive metabolic panel     Status: Abnormal   Collection Time: 06/18/14  6:05 AM  Result Value Ref Range   Sodium 142 135 - 145 mmol/L   Potassium 4.3 3.5 - 5.1 mmol/L   Chloride 108 96 - 112 mmol/L   CO2 19 19 - 32 mmol/L   Glucose, Bld 120 (H) 70 - 99 mg/dL   BUN 48 (H) 6 - 23 mg/dL   Creatinine, Ser 1.86 (H) 0.50 - 1.10 mg/dL   Calcium 8.4 8.4 - 10.5 mg/dL   Total Protein 5.5 (L) 6.0 - 8.3 g/dL   Albumin 3.2 (L) 3.5 - 5.2 g/dL   AST 115 (H) 0 - 37 U/L   ALT 157 (H) 0 - 35 U/L   Alkaline Phosphatase 58 39 - 117 U/L   Total Bilirubin 0.8 0.3 - 1.2 mg/dL   GFR calc non Af Amer 22 (L) >90 mL/min   GFR calc Af Amer 26 (L) >90 mL/min    Comment: (NOTE) The eGFR has been calculated using the CKD EPI equation. This calculation has not been validated in all clinical situations. eGFR's persistently <90 mL/min signify  possible Chronic Kidney Disease.    Anion gap 15 5 - 15  Troponin I     Status: Abnormal   Collection Time: 06/18/14  7:04 AM  Result Value Ref Range   Troponin I 1.55 (HH) <0.031 ng/mL    Comment:        POSSIBLE MYOCARDIAL ISCHEMIA. SERIAL TESTING RECOMMENDED. REPEATED TO VERIFY CRITICAL RESULT CALLED TO, READ BACK BY AND VERIFIED WITH: BURNS,J. RN AT [phone] 06/18/14 MULLINS,T   Troponin I     Status: Abnormal   Collection Time: 06/18/14 12:42 PM  Result Value Ref Range   Troponin I 1.45 (HH) <0.031 ng/mL    Comment:        POSSIBLE MYOCARDIAL ISCHEMIA. SERIAL TESTING RECOMMENDED. CRITICAL VALUE NOTED.  VALUE IS CONSISTENT WITH PREVIOUSLY REPORTED AND CALLED VALUE.   Troponin I     Status: Abnormal   Collection Time: 06/18/14  6:59 PM  Result Value Ref Range   Troponin I 0.96 (HH) <0.031 ng/mL    Comment:        POSSIBLE MYOCARDIAL ISCHEMIA. SERIAL TESTING RECOMMENDED. CRITICAL VALUE NOTED.  VALUE IS CONSISTENT WITH PREVIOUSLY REPORTED AND CALLED VALUE.   Comprehensive metabolic panel     Status: Abnormal   Collection Time: 06/19/14  5:55 AM  Result Value Ref Range   Sodium 138 135 - 145 mmol/L   Potassium 4.2 3.5 - 5.1 mmol/L   Chloride 109 96 - 112 mmol/L   CO2 17 (L) 19 - 32 mmol/L   Glucose, Bld 98 70 - 99 mg/dL   BUN 65 (H) 6 - 23 mg/dL   Creatinine, Ser 1.79 (H) 0.50 - 1.10 mg/dL   Calcium 7.9 (L) 8.4 - 10.5 mg/dL   Total Protein 5.4 (L) 6.0 - 8.3 g/dL   Albumin 3.2 (L) 3.5 - 5.2 g/dL   AST 50 (H) 0 - 37 U/L   ALT 95 (H) 0 - 35 U/L   Alkaline Phosphatase 54 39 - 117 U/L   Total Bilirubin 0.7 0.3 - 1.2 mg/dL   GFR calc non Af Amer 23 (L) >90 mL/min   GFR calc Af Amer 27 (L) >90 mL/min    Comment: (NOTE) The eGFR has been calculated using the CKD EPI equation. This calculation has not been validated in all clinical situations. eGFR's persistently <90 mL/min signify possible Chronic Kidney Disease.    Anion gap 12 5 - 15  CBC     Status: Abnormal    Collection Time: 06/19/14  5:55 AM  Result Value Ref Range   WBC 11.1 (H) 4.0 - 10.5 K/uL   RBC 3.87 3.87 - 5.11 MIL/uL   Hemoglobin 11.8 (L) 12.0 - 15.0 g/dL   HCT 37.0 36.0 - 46.0 %   MCV 95.6 78.0 - 100.0 fL   MCH 30.5 26.0 - 34.0 pg   MCHC 31.9 30.0 - 36.0 g/dL   RDW 13.5 11.5 - 15.5 %   Platelets 112 (L) 150 - 400 K/uL    Comment: SPECIMEN CHECKED FOR CLOTS REPEATED TO VERIFY DELTA CHECK NOTED LARGE PLATELETS PRESENT PLATELET COUNT CONFIRMED BY SMEAR    Recent Results (from the past 240 hour(s))  MRSA PCR Screening     Status: None   Collection Time: 06/17/14  1:00 PM  Result Value Ref Range Status   MRSA by PCR NEGATIVE NEGATIVE Final    Comment:        The GeneXpert MRSA Assay (FDA approved for NASAL specimens only), is one component of a comprehensive MRSA colonization surveillance program. It is not intended to diagnose MRSA infection nor to guide or monitor treatment for MRSA infections.    Creatinine:  Recent Labs  06/17/14 0750 06/18/14 0605 06/19/14 0555  CREATININE 1.41* 1.86* 1.79*   Baseline Creatinine:   Impression/Assessment:  81 year old female with significant dementia and long-standing partial right UPJ obstruction/mild hydronephrosis. While her renal function has worsened it is probably multifactorial and not just related to her chronic mild hydronephrosis.  Certainly given the entire situation there is no indication for any additional testing or treatment.I reviewed some of her imaging studies going back 10-12 years and while the degree of hydronephrosis has waxed and waned to some degree there is not been any obvious significant progression.  All  Plan:  No indication for stent or percutaneous nephrostomy tube at this time.  Given the chronicity of the hydronephrosis and her current age and clinical status I do not think this is a significant issue.  Marylen Zuk S 06/19/2014, 10:22 PM

## 2014-06-19 NOTE — Progress Notes (Signed)
Pt seen today. C/o R hip pain and abd pain. Had large BM yesterday and this am. Had CT abd which showed 2cm gall stone and large amount of stool.  NAD Abd: distention improved, mildly TTP RLE: shortened and externally rotated. Pain with ROM. 2+ DP. SILT. +TA/GS/EHL  1. Displaced R femoral neck fx 2. Cholelithiasis, ? cholecystitis 3. Constipation, abd seems less distended today after large BM x2 cont bowel regimen Plan for OR if HIDA scan (-) for cholecystitis NPO

## 2014-06-19 NOTE — Progress Notes (Signed)
Notified PCP on call of B/P now after 500 ml NS bolus.

## 2014-06-19 NOTE — Anesthesia Postprocedure Evaluation (Signed)
  Anesthesia Post-op Note  Patient: Carrie Moran  Procedure(s) Performed: Procedure(s): RIGHT HIP HEMI ANTERIOR APPROACH (Right)  Patient Location: PACU  Anesthesia Type:General  Level of Consciousness: awake and alert   Airway and Oxygen Therapy: Patient Spontanous Breathing  Post-op Pain: mild  Post-op Assessment: Post-op Vital signs reviewed, Patient's Cardiovascular Status Stable and Respiratory Function Stable  Post-op Vital Signs: stable  Last Vitals:  Filed Vitals:   06/19/14 1730  BP: 154/72  Pulse: 75  Temp:   Resp: 23    Complications: No apparent anesthesia complications

## 2014-06-19 NOTE — Anesthesia Preprocedure Evaluation (Addendum)
Anesthesia Evaluation  Patient identified by MRN, date of birth, ID band Patient awake  General Assessment Comment:Mildly confused  Airway Mallampati: II       Dental  (+) Edentulous Upper   Pulmonary former smoker,  breath sounds clear to auscultation        Cardiovascular hypertension, + Peripheral Vascular Disease Rhythm:Irregular Rate:Normal + Systolic murmurs Echo results noted   Neuro/Psych  Headaches,  Neuromuscular disease    GI/Hepatic GERD-  ,  Endo/Other    Renal/GU Renal InsufficiencyRenal disease     Musculoskeletal  (+) Arthritis -,   Abdominal   Peds  Hematology   Anesthesia Other Findings   Reproductive/Obstetrics                            Anesthesia Physical Anesthesia Plan  ASA: IV  Anesthesia Plan: General   Post-op Pain Management:    Induction: Intravenous  Airway Management Planned: Oral ETT  Additional Equipment:   Intra-op Plan:   Post-operative Plan: Extubation in OR  Informed Consent: I have reviewed the patients History and Physical, chart, labs and discussed the procedure including the risks, benefits and alternatives for the proposed anesthesia with the patient or authorized representative who has indicated his/her understanding and acceptance.   Dental advisory given  Plan Discussed with:   Anesthesia Plan Comments:         Anesthesia Quick Evaluation

## 2014-06-19 NOTE — Op Note (Addendum)
OPERATIVE REPORT  SURGEON: Rod Can, MD   ASSISTANT: Roberto Scales, RNFA  PREOPERATIVE DIAGNOSIS: Displaced Right femoral neck fracture.   POSTOPERATIVE DIAGNOSIS: Displaced Right femoral neck fracture.   PROCEDURE: Right hip hemiarthroplasty, anterior approach.   IMPLANTS: DePuy Tri Lock stem, size 4, std offset, with a -3 mm spacer and a 44 mm monopolar head ball.  ANESTHESIA:  General  ESTIMATED BLOOD LOSS: 150 mL.  DRAINS: None.  COMPLICATIONS: None   CONDITION: PACU - hemodynamically stable.   BRIEF CLINICAL NOTE: Carrie Moran is a 81 y.o. female with a displaced Right femoral neck fracture. The patient was admitted to the hospitalist service and underwent perioperative risk stratification and medical optimization. The risks, benefits, and alternatives to hemiarthroplasty were explained, and the patient elected to proceed.  PROCEDURE IN DETAIL: The patient was taken to the operating room and general anesthesia was induced on the hospital bed. The patient was then positioned on the Hana table. All bony prominences were well padded. The hip was prepped and draped in the normal sterile surgical fashion. A time-out was called verifying side and site of surgery.  The direct anterior approach to the hip was performed through the Hueter interval. Lateral femoral circumflex vessels were treated with the Auqumantys. The anterior capsule was exposed and an inverted T capsulotomy was made. Fracture hematoma was encountered and evacuated. The patient was found to have a comminuted Right subcapital femoral neck fracture. I freshened the femoral neck cut with a saw. I removed the femoral neck fragment. A corkscrew was placed into the head and the head was removed. This was passed to the back table and was measured.  Acetabular exposure was achieved. I examined the articular cartilage which was intact. The labrum was intact. A 44 mm trial head was placed and  found to have excellent fit.  I then gained femoral exposure taking care to protect the abductors and greater trochanter. This was performed using standard external rotation, extension, and adduction. The capsule was peeled off the inner aspect of the greater trochanter, taking care to preserve the short external rotators. A cookie cutter was used to enter the femoral canal, and then the femoral canal finder was used to confirm location. I then sequentially broached up to a size 4. Calcar planer was used on the femoral neck remnant. I paced a std neck and a 36+ 0 head ball.The hip was reduced. Leg lengths were checked fluoroscopically. The hip was dislocated and trial components were removed. I placed the real stem followed by the real spacer and head ball. A single reduction maneuver was performed and the hip was reduced. Fluoroscopy was used to confirm component position and leg lengths. At 90 degrees of external rotation and extension, the hip was stable to an anterior directed force.  The wound was copiously irrigated with normal saline solution. Exparel solution was injected into the periarticular soft tissue. The wound was closed in layers using #1 Vicryl and V-Loc for the fascia, 2-0 Vicryl for the subcutaneous fat, 2-0 Monocryl for the deep dermal layer, 3-0 running Monocryl subcuticular stitch and glue for the skin. Once the glue was fully dried, an Aquacell Ag dressing was applied. The patient was then awakened from anesthesia and transported to the recovery room in stable condition. Sponge, needle, and instrument counts were correct at the end of the case x2. The patient tolerated the procedure well and there were no known complications.

## 2014-06-20 ENCOUNTER — Inpatient Hospital Stay (HOSPITAL_COMMUNITY): Payer: Medicare Other

## 2014-06-20 ENCOUNTER — Encounter (HOSPITAL_COMMUNITY): Payer: Self-pay | Admitting: Orthopedic Surgery

## 2014-06-20 ENCOUNTER — Telehealth: Payer: Self-pay

## 2014-06-20 ENCOUNTER — Other Ambulatory Visit: Payer: Self-pay

## 2014-06-20 DIAGNOSIS — S72001K Fracture of unspecified part of neck of right femur, subsequent encounter for closed fracture with nonunion: Secondary | ICD-10-CM

## 2014-06-20 DIAGNOSIS — I4891 Unspecified atrial fibrillation: Secondary | ICD-10-CM

## 2014-06-20 DIAGNOSIS — I248 Other forms of acute ischemic heart disease: Secondary | ICD-10-CM

## 2014-06-20 LAB — BASIC METABOLIC PANEL
Anion gap: 13 (ref 5–15)
BUN: 52 mg/dL — AB (ref 6–23)
CO2: 16 mmol/L — ABNORMAL LOW (ref 19–32)
Calcium: 8 mg/dL — ABNORMAL LOW (ref 8.4–10.5)
Chloride: 113 mmol/L — ABNORMAL HIGH (ref 96–112)
Creatinine, Ser: 1.33 mg/dL — ABNORMAL HIGH (ref 0.50–1.10)
GFR, EST AFRICAN AMERICAN: 38 mL/min — AB (ref >90)
GFR, EST NON AFRICAN AMERICAN: 33 mL/min — AB (ref >90)
GLUCOSE: 113 mg/dL — AB (ref 70–99)
Potassium: 4.4 mmol/L (ref 3.5–5.1)
Sodium: 142 mmol/L (ref 135–145)

## 2014-06-20 LAB — CBC
HCT: 37.9 % (ref 36.0–46.0)
Hemoglobin: 12.2 g/dL (ref 12.0–15.0)
MCH: 30.7 pg (ref 26.0–34.0)
MCHC: 32.2 g/dL (ref 30.0–36.0)
MCV: 95.2 fL (ref 78.0–100.0)
Platelets: 129 10*3/uL — ABNORMAL LOW (ref 150–400)
RBC: 3.98 MIL/uL (ref 3.87–5.11)
RDW: 13.1 % (ref 11.5–15.5)
WBC: 8.9 10*3/uL (ref 4.0–10.5)

## 2014-06-20 MED ORDER — DILTIAZEM LOAD VIA INFUSION
10.0000 mg | Freq: Once | INTRAVENOUS | Status: AC
  Administered 2014-06-20: 10 mg via INTRAVENOUS
  Filled 2014-06-20: qty 10

## 2014-06-20 MED ORDER — ASPIRIN 81 MG PO CHEW
81.0000 mg | CHEWABLE_TABLET | Freq: Every day | ORAL | Status: DC
  Administered 2014-06-20 – 2014-06-24 (×5): 81 mg via ORAL
  Filled 2014-06-20 (×5): qty 1

## 2014-06-20 MED ORDER — DILTIAZEM HCL 30 MG PO TABS
30.0000 mg | ORAL_TABLET | Freq: Four times a day (QID) | ORAL | Status: DC
  Administered 2014-06-20 – 2014-06-21 (×2): 30 mg via ORAL
  Filled 2014-06-20 (×2): qty 1

## 2014-06-20 MED ORDER — DILTIAZEM HCL 100 MG IV SOLR
5.0000 mg/h | INTRAVENOUS | Status: DC
  Administered 2014-06-20: 10 mg/h via INTRAVENOUS
  Administered 2014-06-20: 5 mg/h via INTRAVENOUS
  Filled 2014-06-20: qty 100

## 2014-06-20 NOTE — Progress Notes (Addendum)
Patient in the middle of eating breakfast when sudden onset pain started.  Patient starting yelling "it hurts" and moaning while pointing and grabbing her left upper abdominal area.  Patient's vital signs are temp 97.6, respirations 28, blood pressure 137/79, and oxygen saturation is 100% on 2 liters nasal canula.  Patient received one tablet of vicodin for pain.  RN notified by telemetry monitor patient now in atrial fibrillation. Patient worked earlier this morning with physical therapy and could not even get out of bed.  Dr. Carles Collet notified. Per MD can leave foley catheter in place.  New orders received.  Will continue to monitor patient.

## 2014-06-20 NOTE — Progress Notes (Signed)
SUBJECTIVE:  No complaints of CP or SOB  OBJECTIVE:   Vitals:   Filed Vitals:   06/20/14 0131 06/20/14 0400 06/20/14 0643 06/20/14 0800  BP: 147/68  161/94   Pulse: 62  68   Temp: 97.6 F (36.4 C)  97.5 F (36.4 C)   TempSrc: Axillary  Oral   Resp: 18 20 18 20   Height:      Weight:      SpO2: 100% 98% 100% 100%   I&O's:   Intake/Output Summary (Last 24 hours) at 06/20/14 0932 Last data filed at 06/20/14 0900  Gross per 24 hour  Intake   2510 ml  Output   1080 ml  Net   1430 ml   TELEMETRY: Reviewed telemetry pt in NSR with PAC's:     PHYSICAL EXAM General: Well developed, well nourished, in no acute distress Head: Eyes PERRLA, No xanthomas.   Normal cephalic and atramatic  Lungs:   Clear bilaterally to auscultation and percussion. Heart:   HRRR S1 S2 Pulses are 2+ & equal.  Occasional ectopy Abdomen: Bowel sounds are positive, abdomen soft and non-tender without masses  Extremities:   No clubbing, cyanosis or edema.  DP +1 Neuro: Alert and oriented X 3. Psych:  Good affect, responds appropriately   LABS: Basic Metabolic Panel:  Recent Labs  06/19/14 0555 06/20/14 0522  NA 138 142  K 4.2 4.4  CL 109 113*  CO2 17* 16*  GLUCOSE 98 113*  BUN 65* 52*  CREATININE 1.79* 1.33*  CALCIUM 7.9* 8.0*   Liver Function Tests:  Recent Labs  06/18/14 0605 06/19/14 0555  AST 115* 50*  ALT 157* 95*  ALKPHOS 58 54  BILITOT 0.8 0.7  PROT 5.5* 5.4*  ALBUMIN 3.2* 3.2*   No results for input(s): LIPASE, AMYLASE in the last 72 hours. CBC:  Recent Labs  06/19/14 0555 06/20/14 0522  WBC 11.1* 8.9  HGB 11.8* 12.2  HCT 37.0 37.9  MCV 95.6 95.2  PLT 112* 129*   Cardiac Enzymes:  Recent Labs  06/18/14 0704 06/18/14 1242 06/18/14 1859  TROPONINI 1.55* 1.45* 0.96*   BNP: Invalid input(s): POCBNP D-Dimer: No results for input(s): DDIMER in the last 72 hours. Hemoglobin A1C: No results for input(s): HGBA1C in the last 72 hours. Fasting Lipid  Panel: No results for input(s): CHOL, HDL, LDLCALC, TRIG, CHOLHDL, LDLDIRECT in the last 72 hours. Thyroid Function Tests: No results for input(s): TSH, T4TOTAL, T3FREE, THYROIDAB in the last 72 hours.  Invalid input(s): FREET3 Anemia Panel: No results for input(s): VITAMINB12, FOLATE, FERRITIN, TIBC, IRON, RETICCTPCT in the last 72 hours. Coag Panel:   Lab Results  Component Value Date   INR 1.37 06/17/2014   INR 1.03 05/02/2009    RADIOLOGY: Ct Abdomen Pelvis Wo Contrast  06/19/2014   CLINICAL DATA:  Abdominal pain.  Leukocytosis.  EXAM: CT ABDOMEN AND PELVIS WITHOUT CONTRAST  TECHNIQUE: Multidetector CT imaging of the abdomen and pelvis was performed following the standard protocol without IV contrast.  COMPARISON:  06/17/2014 radiographs  FINDINGS: There are unremarkable unenhanced appearances of the liver, spleen, and pancreas. There is a 2.0 cm calculus within the gallbladder lumen. There is no bile duct dilatation. The right kidney is hydronephrotic, with abrupt caliber transition at the ureteropelvic junction. This may represent a chronic UPJ obstruction. No obstructing calculus or mass is evident. The left kidney is normal. There is no evidence of bowel obstruction or extraluminal air. There is mild fluid and air distention of small  and large bowel without caliber transition. There is a prominent rectal stool mass which may represent a degree of fecal impaction. There is colonic diverticulosis. There is no evidence of diverticulitis or other focal acute inflammatory process in the abdomen or pelvis. There is no ascites.  In the lower chest there are small bilateral effusions, right greater than left. Lung bases are clear.  There is a subcapital right hip fracture. No other acute musculoskeletal abnormalities are evident. Moderately severe degenerative disc and facet changes are present from L2 through the sacrum.  IMPRESSION: 1. Cholelithiasis 2. Right hydronephrosis, perhaps a chronic UPJ  obstruction. No obstructing mass or calculus is evident. 3. Diverticulosis 4. The rectum is distended with stool, raising the question of fecal impaction. There is moderate distension of all of the small and large bowel without caliber transition. 5. Small bilateral pleural effusions 6. Subcapital right hip fracture   Electronically Signed   By: Andreas Newport M.D.   On: 06/19/2014 03:11   Dg Chest 1 View  06/17/2014   CLINICAL DATA:  Pain following fall  EXAM: CHEST  1 VIEW  COMPARISON:  October 12, 2013  FINDINGS: There is no edema or consolidation. Heart is slightly enlarged with pulmonary vascularity within normal limits. No adenopathy. There is atherosclerotic change in the aorta. No pneumothorax. No acute fracture. There is extensive arthropathy in each shoulder. There is evidence of old trauma involving the left third rib.  IMPRESSION: No edema or consolidation. No change in cardiac silhouette. No pneumothorax.   Electronically Signed   By: Lowella Grip III M.D.   On: 06/17/2014 08:09   Dg Abd 1 View  06/17/2014   CLINICAL DATA:  Abdominal distension. History of renal insufficiency.  EXAM: ABDOMEN - 1 VIEW  COMPARISON:  CT 01/30/2010  FINDINGS: There is a displaced subcapital right femur fracture. Left hip appears intact. Large amount of gas and stool within the abdomen and pelvis. In particular, there is a large amount of gas in the mid abdomen. Limited evaluation for free air on this supine image. Levoscoliosis of the lumbar spine.  IMPRESSION: Large amount of gas and stool throughout the abdomen and pelvis.  Right subcapital femur fracture.   Electronically Signed   By: Markus Daft M.D.   On: 06/17/2014 20:15   Nm Hepatobiliary Liver Func  06/19/2014   CLINICAL DATA:  Cholelithiasis on CT.  Evaluate for cholecystitis.  EXAM: NUCLEAR MEDICINE HEPATOBILIARY IMAGING  TECHNIQUE: Sequential images of the abdomen were obtained out to 60 minutes following intravenous administration of  radiopharmaceutical.  RADIOPHARMACEUTICALS:  5.5 [place] HO-12Y Choletec  COMPARISON:  CT 06/18/2014  FINDINGS: There is prompt clearance radiotracer from the blood pool. Counts are evident within the small bowel by 30 minutes. Counts begin to fill the gallbladder by 18 minutes and continues to fill.  IMPRESSION: Filling of the gallbladder indicates patent cystic duct. No evidence of acute cholecystitis.   Electronically Signed   By: Suzy Bouchard M.D.   On: 06/19/2014 12:03   Pelvis Portable  06/19/2014   CLINICAL DATA:  Status post right hip replacement  EXAM: PORTABLE PELVIS 1-2 VIEWS  COMPARISON:  None.  FINDINGS: Right hip replacement is now seen. No acute bony abnormality is seen. Soft tissue changes are noted consistent with the recent surgery. The pelvic ring is visualized is intact.  IMPRESSION: Status post right hip replacement without acute abnormality.   Electronically Signed   By: Inez Catalina M.D.   On: 06/19/2014 17:36  Dg C-arm 61-120 Min-no Report  06/19/2014   CLINICAL DATA: right anterior hip   C-ARM 61-120 MINUTES  Fluoroscopy was utilized by the requesting physician.  No radiographic  interpretation.    Dg Hip Unilat With Pelvis 2-3 Views Right  06/17/2014   CLINICAL DATA:  Fall this morning with right hip pain and lower extremity foreshortening. Initial encounter.  EXAM: RIGHT HIP (WITH PELVIS) 2-3 VIEWS  COMPARISON:  None.  FINDINGS: There is evidence of an acute, complete subcapital fracture of the proximal femur with mild displacement. No dislocation. No bony lesions are seen. The bony pelvis is intact.  IMPRESSION: Acute subcapital fracture of the right hip.   Electronically Signed   By: Aletta Edouard M.D.   On: 06/17/2014 08:07   Dg Femur, Min 2 Views Right  06/17/2014   CLINICAL DATA:  Recent fall with known hip fracture  EXAM: RIGHT FEMUR 2 VIEWS  COMPARISON:  None.  FINDINGS: Subcapital femoral neck fracture is noted with some impaction at the fracture site.  Degenerative changes about the knee joint are seen. Diffuse vascular calcifications are noted. No other fractures are seen.  IMPRESSION: Subcapital right femoral neck fracture. No other acute abnormality is seen.   Electronically Signed   By: Inez Catalina M.D.   On: 06/17/2014 12:09   US Abdomen Limited Ruq  06/18/2014   CLINICAL DATA:  Abnormal elevated liver enzymes, elevated creatinine and  EXAM: US ABDOMEN LIMITED - RIGHT UPPER QUADRANT  COMPARISON:  None.  FINDINGS: Gallbladder:  Distended to at least 7-8 mm. There is a 2.6 cm stone. Wall thickness is within normal limits at 2 mm and there is no Murphy's sign.  Common bile duct:  Diameter: Measures between 3-6 mm  Liver:  1 cm cyst in the left lobe. No biliary dilatation. No focal abnormalities.  IMPRESSION: Study limited by patient in mobility related to hip fracture. Gallbladder is difficult to completely image. There is a gallstone with no Murphy's sign.   Electronically Signed   By: Skipper Cliche M.D.   On: 06/18/2014 13:29   IMPRESSION/RECOMMENDATION:  1. Hip fracture-s/p repair 2. Elevated troponin- this likely represents demand ischemia, especially since she's had no anginal symptoms but did peak at 1.55. 2D echo showed normal LVF with moderate LVH and severe basal septal hypertrophy and moderate TR with moderate pulmonary HTN. There was no evidence of intracavitary gradient. She could very well have underlying CAD but Dr. Debara Pickett spoke with her son, it was felt that it would be unlikely for her to agree to coronary catheterization she had stress testing and it was abnormal.  2D echo with normal LVF and no RWMA's.  No further workup at this time.  She did well with hip surgery and has no complaints today.  No new recs.  Will sign off.  Please have her followup as outpt with Dr. Debara Pickett.    Sueanne Margarita, MD  06/20/2014  9:32 AM

## 2014-06-20 NOTE — Evaluation (Signed)
Physical Therapy Evaluation Patient Details Name: Carrie Moran MRN: [id number] DOB: 11-04-1917 Today's Date: 06/20/2014   History of Present Illness  81 yo female fell at  [facility]-displaced R femoral neck fracture. S/P R hip hemiarthroplasty  via anterior approach.  Clinical Impression  Patient tolerated bed mobility and sitting on the edge of the bed. Unable to attempt standing as did not have a second person to assist. Patient with minimal c/o pain. Patient will benefit from PT to address problems listed in notes below.     Follow Up Recommendations SNF;Supervision/Assistance - 24 hour    Equipment Recommendations  None recommended by PT    Recommendations for Other Services       Precautions / Restrictions Precautions Precautions: Fall Restrictions Weight Bearing Restrictions: No RLE Weight Bearing: Weight bearing as tolerated      Mobility  Bed Mobility Overal bed mobility: Needs Assistance Bed Mobility: Supine to Sit;Sit to Supine     Supine to sit: Max assist;HOB elevated Sit to supine: Max assist   General bed mobility comments: purple slide sheet placed under  buttocks to facilitate sliding  around to edge. mod assist for trunk to upright. Then assist to recline position with patient assisting getting legs onto bed.  Transfers                 General transfer comment: NT as did not have 2 persons to attempt safely  Ambulation/Gait                Stairs            Wheelchair Mobility    Modified Rankin (Stroke Patients Only)       Balance Overall balance assessment: Needs assistance;History of Falls Sitting-balance support: Feet supported;Bilateral upper extremity supported Sitting balance-Leahy Scale: Poor Sitting balance - Comments: cues for moving to midline, R leg noted edema which may have increased L lean Postural control: Left lateral lean                                   Pertinent Vitals/Pain Pain  Assessment: Faces Faces Pain Scale: Hurts little more Pain Location: R hip/thigh during mobility Pain Descriptors / Indicators: Discomfort Pain Intervention(s): Monitored during session;Ice applied    Home Living Family/patient expects to be discharged to:: Skilled nursing facility                      Prior Function           Comments: chart reports ambulation with RW at facility,      Hand Dominance        Extremity/Trunk Assessment   Upper Extremity Assessment: RUE deficits/detail;LUE deficits/detail RUE Deficits / Details: decreased elevation of shoulder.     LUE Deficits / Details: decreased Shoulder elevation   Lower Extremity Assessment: RLE deficits/detail RLE Deficits / Details: pt able to raise leg from bed.    Cervical / Trunk Assessment: Kyphotic  Communication   Communication: HOH  Cognition Arousal/Alertness: Awake/alert Behavior During Therapy: WFL for tasks assessed/performed Overall Cognitive Status: History of cognitive impairments - at baseline Area of Impairment: Orientation     Memory: Decreased short-term memory         General Comments: pt  did not know about her surgery    General Comments      Exercises General Exercises - Lower Extremity Long Arc Quad: AROM;Both;10 reps;Seated  Assessment/Plan    PT Assessment Patient needs continued PT services  PT Diagnosis Difficulty walking;Generalized weakness;Acute pain   PT Problem List Decreased strength;Decreased range of motion;Decreased activity tolerance;Decreased mobility;Decreased balance;Cardiopulmonary status limiting activity;Decreased knowledge of precautions;Decreased safety awareness;Decreased cognition;Pain  PT Treatment Interventions DME instruction;Gait training;Functional mobility training;Therapeutic activities;Therapeutic exercise;Patient/family education   PT Goals (Current goals can be found in the Care Plan section) Acute Rehab PT Goals PT Goal  Formulation: Patient unable to participate in goal setting Time For Goal Achievement: 07/04/14 Potential to Achieve Goals: Fair    Frequency Min 3X/week   Barriers to discharge        Co-evaluation               End of Session   Activity Tolerance: Patient tolerated treatment well Patient left: in bed;with call bell/phone within reach;with bed alarm set Nurse Communication: Mobility status         Time: 3833-3832 PT Time Calculation (min) (ACUTE ONLY): 24 min   Charges:   PT Evaluation $Initial PT Evaluation Tier I: 1 Procedure PT Treatments $Therapeutic Activity: 8-22 mins   PT G Codes:        Claretha Cooper 06/20/2014, 9:29 AM Tresa Endo PT [phone]

## 2014-06-20 NOTE — Progress Notes (Signed)
   Subjective:  Patient reports pain as mild.  No c/o.  Objective:   VITALS:   Filed Vitals:   06/20/14 0000 06/20/14 0131 06/20/14 0400 06/20/14 0643  BP:  147/68  161/94  Pulse:  62  68  Temp:  97.6 F (36.4 C)  97.5 F (36.4 C)  TempSrc:  Axillary  Oral  Resp: 18 18 20 18   Height:      Weight:      SpO2: 100% 100% 98% 100%    ABD soft Sensation intact distally Intact pulses distally Dorsiflexion/Plantar flexion intact Incision: dressing C/D/I Thigh soft  Lab Results  Component Value Date   WBC 8.9 06/20/2014   HGB 12.2 06/20/2014   HCT 37.9 06/20/2014   MCV 95.2 06/20/2014   PLT 129* 06/20/2014   BMET    Component Value Date/Time   NA 142 06/20/2014 0522   NA 142 07/09/2013 1250   K 4.4 06/20/2014 0522   CL 113* 06/20/2014 0522   CO2 16* 06/20/2014 0522   GLUCOSE 113* 06/20/2014 0522   GLUCOSE 88 07/09/2013 1250   BUN 52* 06/20/2014 0522   BUN 29 07/09/2013 1250   CREATININE 1.33* 06/20/2014 0522   CALCIUM 8.0* 06/20/2014 0522   GFRNONAA 33* 06/20/2014 0522   GFRAA 38* 06/20/2014 0522     Assessment/Plan: 1 Day Post-Op   Principal Problem:   Hip fracture, right Active Problems:   Essential hypertension, benign   Glaucoma   Elevated troponin   Preoperative cardiovascular examination   Hip fracture requiring operative repair   Stage III chronic kidney disease   Demand ischemia   Acute renal failure superimposed on stage 3 chronic kidney disease   Femoral neck fracture   Transaminitis   WBAT with walker PO pain control DVT ppx: lovenox 30mg  daily x30 days, SCDs, TEDs PT/OT Discharge home with home health   Janis Cuffe, Horald Pollen 06/20/2014, 7:27 AM   Rod Can, MD Cell [phone]

## 2014-06-20 NOTE — Progress Notes (Signed)
Report received from Melissa Brannan, RN. No change from initial pm assessment. Will continue to monitor and follow the POC.  

## 2014-06-20 NOTE — Progress Notes (Signed)
CSW received consult from nurse that son was at bedside inquiring about pt going to a SNF.  Son stated that pt had a bed at [facility]. However, there must have been a misunderstanding. CSW reached out to [facility] who states that they are not aware of the pt and that they have not received any clinicals.  CSW informed family and pt of the process. CSW informed son that an FL2 would be completed which would send a message to local facilities regarding the pt to try and obtain placement. Son states that he has consulted with physician who stated that the pt's discharge date is set for Monday.  CSW will make the unit CSW aware of the situation.  Willette Brace [phone] ED CSW 06/20/2014 10:36 PM

## 2014-06-20 NOTE — Progress Notes (Signed)
PROGRESS NOTE  Carrie Moran RKY:[id number] DOB: 07/11/1917 DOA: 06/17/2014 PCP: Blanchie Serve, MD  Assessment/Plan: Atrial fibrillation with RVR -Developed on the morning of 06/20/2014 -Check TSH -Restart aspirin 81 mg daily -The patient is not a good candidate for anticoagulation secondary to her mechanical falls--presently resulted in R-femoral neck fracture -CHADS-VASc = 5 -d/c amlodipine -start diltiazem drip -notified Cardiology, spoke with Dr. Radford Pax  Right Femoral Neck Fracture -appreciate ortho -prophylactic lovenox x 30 days -06/19/14--R-hemiarthroplasty -WBAT   Leukocytosis / transaminitis  - May have component of stress demargination-->resolved  - Abd US--distended gallbladder with 2.6 m stone, but difficult to assess gallbladder  - HIDA scan--neg  Chest x-ray clear. Urinalysis negative for nitrites with small leukocytes and negative bacteria.  06/18/14--CT abdomen--R-side hydronephrosis @ UPJ, mild fluid and air distension on sm+lg bowel, rectal stool mass   Afebrile and hemodynamically stable  Acute on Chronic Stage III chronic kidney disease/Hydronephrosis R-renal  Creatinine 1.2-1.6 at baseline. Current creatinine 1.7  Due to dehyddration, although unclear if hydronephrosis has any contribution  R-hydronephrosis present on previous CTs dating back to 08/09/06  Appreciate urology opinion--no intervention needed presently  D/c diclofenac gel   Constipation / Abdominal distention  Check KUB--constipation  Fleets enema-->large BM, but still with abdominal pain  06/19/14--had another large BM   Essential hypertension, benign  Continue diltiazem, metoprolol and clonidine.   Glaucoma  Continue Xalatan.   Elevated troponin/demand ischemia  Cardiology following.  2-D echo shows EF 65-70 percent with grade 1 diastolic dysfunction. No significant changes from prior 2-D echo, supporting a diagnosis of  demand ischemia.    Peripheral vascular disease   Continue Pletal.    DVT prophylaxis  Lovenox ordered.  Code Status: DNR. Family Communication: UpdatedSon at the bedside 06/20/14--total time 65 min.  >50% spent counseling and coordinating care Disposition Plan: SNF when stable.        Procedures/Studies: Ct Abdomen Pelvis Wo Contrast  06/19/2014   CLINICAL DATA:  Abdominal pain.  Leukocytosis.  EXAM: CT ABDOMEN AND PELVIS WITHOUT CONTRAST  TECHNIQUE: Multidetector CT imaging of the abdomen and pelvis was performed following the standard protocol without IV contrast.  COMPARISON:  06/17/2014 radiographs  FINDINGS: There are unremarkable unenhanced appearances of the liver, spleen, and pancreas. There is a 2.0 cm calculus within the gallbladder lumen. There is no bile duct dilatation. The right kidney is hydronephrotic, with abrupt caliber transition at the ureteropelvic junction. This may represent a chronic UPJ obstruction. No obstructing calculus or mass is evident. The left kidney is normal. There is no evidence of bowel obstruction or extraluminal air. There is mild fluid and air distention of small and large bowel without caliber transition. There is a prominent rectal stool mass which may represent a degree of fecal impaction. There is colonic diverticulosis. There is no evidence of diverticulitis or other focal acute inflammatory process in the abdomen or pelvis. There is no ascites.  In the lower chest there are small bilateral effusions, right greater than left. Lung bases are clear.  There is a subcapital right hip fracture. No other acute musculoskeletal abnormalities are evident. Moderately severe degenerative disc and facet changes are present from L2 through the sacrum.  IMPRESSION: 1. Cholelithiasis 2. Right hydronephrosis, perhaps a chronic UPJ obstruction. No obstructing mass or calculus is evident. 3. Diverticulosis 4. The rectum is distended with stool, raising the  question of fecal impaction. There is moderate distension of all of the small and  large bowel without caliber transition. 5. Small bilateral pleural effusions 6. Subcapital right hip fracture   Electronically Signed   By: Andreas Newport M.D.   On: 06/19/2014 03:11   Dg Chest 1 View  06/17/2014   CLINICAL DATA:  Pain following fall  EXAM: CHEST  1 VIEW  COMPARISON:  October 12, 2013  FINDINGS: There is no edema or consolidation. Heart is slightly enlarged with pulmonary vascularity within normal limits. No adenopathy. There is atherosclerotic change in the aorta. No pneumothorax. No acute fracture. There is extensive arthropathy in each shoulder. There is evidence of old trauma involving the left third rib.  IMPRESSION: No edema or consolidation. No change in cardiac silhouette. No pneumothorax.   Electronically Signed   By: Lowella Grip III M.D.   On: 06/17/2014 08:09   Dg Abd 1 View  06/17/2014   CLINICAL DATA:  Abdominal distension. History of renal insufficiency.  EXAM: ABDOMEN - 1 VIEW  COMPARISON:  CT 01/30/2010  FINDINGS: There is a displaced subcapital right femur fracture. Left hip appears intact. Large amount of gas and stool within the abdomen and pelvis. In particular, there is a large amount of gas in the mid abdomen. Limited evaluation for free air on this supine image. Levoscoliosis of the lumbar spine.  IMPRESSION: Large amount of gas and stool throughout the abdomen and pelvis.  Right subcapital femur fracture.   Electronically Signed   By: Markus Daft M.D.   On: 06/17/2014 20:15   Nm Hepatobiliary Liver Func  06/19/2014   CLINICAL DATA:  Cholelithiasis on CT.  Evaluate for cholecystitis.  EXAM: NUCLEAR MEDICINE HEPATOBILIARY IMAGING  TECHNIQUE: Sequential images of the abdomen were obtained out to 60 minutes following intravenous administration of radiopharmaceutical.  RADIOPHARMACEUTICALS:  5.5 [place] [id number] Choletec  COMPARISON:  CT 06/18/2014  FINDINGS: There is prompt clearance  radiotracer from the blood pool. Counts are evident within the small bowel by 30 minutes. Counts begin to fill the gallbladder by 18 minutes and continues to fill.  IMPRESSION: Filling of the gallbladder indicates patent cystic duct. No evidence of acute cholecystitis.   Electronically Signed   By: Suzy Bouchard M.D.   On: 06/19/2014 12:03   Pelvis Portable  06/19/2014   CLINICAL DATA:  Status post right hip replacement  EXAM: PORTABLE PELVIS 1-2 VIEWS  COMPARISON:  None.  FINDINGS: Right hip replacement is now seen. No acute bony abnormality is seen. Soft tissue changes are noted consistent with the recent surgery. The pelvic ring is visualized is intact.  IMPRESSION: Status post right hip replacement without acute abnormality.   Electronically Signed   By: Inez Catalina M.D.   On: 06/19/2014 17:36   Dg C-arm 61-120 Min-no Report  06/19/2014   CLINICAL DATA: right anterior hip   C-ARM 61-120 MINUTES  Fluoroscopy was utilized by the requesting physician.  No radiographic  interpretation.    Dg Hip Unilat With Pelvis 2-3 Views Right  06/17/2014   CLINICAL DATA:  Fall this morning with right hip pain and lower extremity foreshortening. Initial encounter.  EXAM: RIGHT HIP (WITH PELVIS) 2-3 VIEWS  COMPARISON:  None.  FINDINGS: There is evidence of an acute, complete subcapital fracture of the proximal femur with mild displacement. No dislocation. No bony lesions are seen. The bony pelvis is intact.  IMPRESSION: Acute subcapital fracture of the right hip.   Electronically Signed   By: Aletta Edouard M.D.   On: 06/17/2014 08:07   Dg Femur, Min 2 Views Right  06/17/2014   CLINICAL DATA:  Recent fall with known hip fracture  EXAM: RIGHT FEMUR 2 VIEWS  COMPARISON:  None.  FINDINGS: Subcapital femoral neck fracture is noted with some impaction at the fracture site. Degenerative changes about the knee joint are seen. Diffuse vascular calcifications are noted. No other fractures are seen.  IMPRESSION: Subcapital  right femoral neck fracture. No other acute abnormality is seen.   Electronically Signed   By: Inez Catalina M.D.   On: 06/17/2014 12:09   US Abdomen Limited Ruq  06/18/2014   CLINICAL DATA:  Abnormal elevated liver enzymes, elevated creatinine and  EXAM: US ABDOMEN LIMITED - RIGHT UPPER QUADRANT  COMPARISON:  None.  FINDINGS: Gallbladder:  Distended to at least 7-8 mm. There is a 2.6 cm stone. Wall thickness is within normal limits at 2 mm and there is no Murphy's sign.  Common bile duct:  Diameter: Measures between 3-6 mm  Liver:  1 cm cyst in the left lobe. No biliary dilatation. No focal abnormalities.  IMPRESSION: Study limited by patient in mobility related to hip fracture. Gallbladder is difficult to completely image. There is a gallstone with no Murphy's sign.   Electronically Signed   By: Skipper Cliche M.D.   On: 06/18/2014 13:29         Subjective: Eating breakfast morning, the patient developed abdominal pain in the left upper quadrant. She had some nausea without any emesis. She states her abdominal pain is better than it was. No dysuria or hematuria. She denies any chest pain or shortness of breath.  Objective: Filed Vitals:   06/20/14 0400 06/20/14 0643 06/20/14 0800 06/20/14 0955  BP:  161/94  137/79  Pulse:  68  128  Temp:  97.5 F (36.4 C)  97.6 F (36.4 C)  TempSrc:  Oral  Oral  Resp: 20 18 20 28   Height:      Weight:      SpO2: 98% 100% 100% 100%    Intake/Output Summary (Last 24 hours) at 06/20/14 1004 Last data filed at 06/20/14 0900  Gross per 24 hour  Intake   2510 ml  Output   1080 ml  Net   1430 ml   Weight change:  Exam:   General:  Pt is alert, follows commands appropriately, not in acute distress  HEENT: No icterus, No thrush,  [place]/AT  Cardiovascular: IRRR, S1/S2, no rubs, no gallops  Respiratory: Bibasilar left greater than right crackles. No wheezes  Abdomen: Soft/+BS, non tender, non distended, no guarding  Extremities: trace LE edema,  No lymphangitis, No petechiae, No rashes, no synovitis  Data Reviewed: Basic Metabolic Panel:  Recent Labs Lab 06/17/14 0750 06/18/14 0605 06/19/14 0555 06/20/14 0522  NA 139 142 138 142  K 4.0 4.3 4.2 4.4  CL 108 108 109 113*  CO2 20 19 17* 16*  GLUCOSE 265* 120* 98 113*  BUN 34* 48* 65* 52*  CREATININE 1.41* 1.86* 1.79* 1.33*  CALCIUM 9.1 8.4 7.9* 8.0*   Liver Function Tests:  Recent Labs Lab 06/17/14 0750 06/18/14 0605 06/19/14 0555  AST 362* 115* 50*  ALT 244* 157* 95*  ALKPHOS 78 58 54  BILITOT 1.4* 0.8 0.7  PROT 6.2 5.5* 5.4*  ALBUMIN 3.6 3.2* 3.2*   No results for input(s): LIPASE, AMYLASE in the last 168 hours. No results for input(s): AMMONIA in the last 168 hours. CBC:  Recent Labs Lab 06/17/14 0750 06/19/14 0555 06/20/14 0522  WBC 22.2* 11.1* 8.9  NEUTROABS 17.8*  --   --  HGB 13.9 11.8* 12.2  HCT 45.1 37.0 37.9  MCV 97.2 95.6 95.2  PLT 160 112* 129*   Cardiac Enzymes:  Recent Labs Lab 06/18/14 0704 06/18/14 1242 06/18/14 1859  TROPONINI 1.55* 1.45* 0.96*   BNP: Invalid input(s): POCBNP CBG: No results for input(s): GLUCAP in the last 168 hours.  Recent Results (from the past 240 hour(s))  MRSA PCR Screening     Status: None   Collection Time: 06/17/14  1:00 PM  Result Value Ref Range Status   MRSA by PCR NEGATIVE NEGATIVE Final    Comment:        The GeneXpert MRSA Assay (FDA approved for NASAL specimens only), is one component of a comprehensive MRSA colonization surveillance program. It is not intended to diagnose MRSA infection nor to guide or monitor treatment for MRSA infections.      Scheduled Meds: . amLODipine  10 mg Oral Daily  . aspirin  81 mg Oral Daily  . cilostazol  50 mg Oral BID  . cloNIDine  0.1 mg Oral BID  . enoxaparin (LOVENOX) injection  30 mg Subcutaneous Q24H  . gabapentin  100 mg Oral QHS  . latanoprost  1 drop Both Eyes Daily  . metoprolol tartrate  25 mg Oral BID  . mirabegron ER  25 mg  Oral Daily  . pantoprazole  40 mg Oral Daily  . polyethylene glycol  17 g Oral Daily  . timolol  1 drop Both Eyes Daily  . topiramate  25 mg Oral BID   Continuous Infusions: . sodium chloride 100 mL/hr at 06/20/14 0841     Anylah Scheib, DO  [facility] Pager [phone]  If 7PM-7AM, please contact night-coverage [email or web address] Password [email or web address] 06/20/2014, 10:04 AM   LOS: 3 days

## 2014-06-20 NOTE — Telephone Encounter (Signed)
Left message on voicemail for patient to return call when available, reason for call: Update flu vaccine status

## 2014-06-20 NOTE — Progress Notes (Signed)
OT Cancellation Note  Patient Details Name: Carrie Moran MRN: [id number] DOB: 1918/01/03   Cancelled Treatment:    Reason Eval/Treat Not Completed: Other (comment) Defer OT eval to SNF.  [place], [place] 06/20/2014, 11:50 AM

## 2014-06-21 ENCOUNTER — Inpatient Hospital Stay (HOSPITAL_COMMUNITY): Payer: Medicare Other

## 2014-06-21 LAB — BASIC METABOLIC PANEL
Anion gap: 10 (ref 5–15)
BUN: 41 mg/dL — AB (ref 6–23)
CO2: 21 mmol/L (ref 19–32)
Calcium: 8.8 mg/dL (ref 8.4–10.5)
Chloride: 115 mmol/L — ABNORMAL HIGH (ref 96–112)
Creatinine, Ser: 1.05 mg/dL (ref 0.50–1.10)
GFR calc non Af Amer: 44 mL/min — ABNORMAL LOW (ref >90)
GFR, EST AFRICAN AMERICAN: 51 mL/min — AB (ref >90)
GLUCOSE: 197 mg/dL — AB (ref 70–99)
Potassium: 3.9 mmol/L (ref 3.5–5.1)
SODIUM: 146 mmol/L — AB (ref 135–145)

## 2014-06-21 LAB — URINE MICROSCOPIC-ADD ON

## 2014-06-21 LAB — URINALYSIS, ROUTINE W REFLEX MICROSCOPIC
Bilirubin Urine: NEGATIVE
GLUCOSE, UA: 100 mg/dL — AB
Ketones, ur: NEGATIVE mg/dL
Nitrite: POSITIVE — AB
PH: 6.5 (ref 5.0–8.0)
PROTEIN: NEGATIVE mg/dL
Specific Gravity, Urine: 1.012 (ref 1.005–1.030)
Urobilinogen, UA: 0.2 mg/dL (ref 0.0–1.0)

## 2014-06-21 LAB — CBC
HCT: 36.4 % (ref 36.0–46.0)
HEMOGLOBIN: 11.6 g/dL — AB (ref 12.0–15.0)
MCH: 30 pg (ref 26.0–34.0)
MCHC: 31.9 g/dL (ref 30.0–36.0)
MCV: 94.1 fL (ref 78.0–100.0)
PLATELETS: 178 10*3/uL (ref 150–400)
RBC: 3.87 MIL/uL (ref 3.87–5.11)
RDW: 13.7 % (ref 11.5–15.5)
WBC: 15.6 10*3/uL — AB (ref 4.0–10.5)

## 2014-06-21 LAB — MAGNESIUM: MAGNESIUM: 1.9 mg/dL (ref 1.5–2.5)

## 2014-06-21 LAB — CLOSTRIDIUM DIFFICILE BY PCR: Toxigenic C. Difficile by PCR: NEGATIVE

## 2014-06-21 MED ORDER — ALBUTEROL SULFATE (2.5 MG/3ML) 0.083% IN NEBU
2.5000 mg | INHALATION_SOLUTION | RESPIRATORY_TRACT | Status: DC | PRN
Start: 2014-06-21 — End: 2014-06-24
  Administered 2014-06-21 – 2014-06-22 (×2): 2.5 mg via RESPIRATORY_TRACT
  Filled 2014-06-21 (×2): qty 3

## 2014-06-21 MED ORDER — METOPROLOL TARTRATE 50 MG PO TABS
50.0000 mg | ORAL_TABLET | Freq: Two times a day (BID) | ORAL | Status: DC
  Administered 2014-06-21 (×2): 50 mg via ORAL
  Filled 2014-06-21 (×2): qty 1

## 2014-06-21 MED ORDER — SODIUM CHLORIDE 0.9 % IV SOLN
500.0000 mg | INTRAVENOUS | Status: DC
  Administered 2014-06-21 – 2014-06-22 (×2): 500 mg via INTRAVENOUS
  Filled 2014-06-21 (×3): qty 500

## 2014-06-21 MED ORDER — CEFEPIME HCL 1 G IJ SOLR
1.0000 g | INTRAMUSCULAR | Status: DC
  Administered 2014-06-21 – 2014-06-23 (×3): 1 g via INTRAVENOUS
  Filled 2014-06-21 (×4): qty 1

## 2014-06-21 NOTE — Progress Notes (Addendum)
   Subjective: 2 Days Post-Op Procedure(s) (LRB): RIGHT HIP HEMI ANTERIOR APPROACH (Right) Patient reports pain as mild.   Resting comfortably in bed   Objective: Vital signs in last 24 hours: Temp:  [97.6 F (36.4 C)-98.6 F (37 C)] 97.8 F (36.6 C) (03/19 0407) Pulse Rate:  [85-128] 90 (03/19 0407) Resp:  [13-28] 28 (03/19 0407) BP: (137-174)/(74-93) 174/93 mmHg (03/19 0407) SpO2:  [96 %-100 %] 97 % (03/19 0407)  Intake/Output from previous day:  Intake/Output Summary (Last 24 hours) at 06/21/14 0835 Last data filed at 06/20/14 2224  Gross per 24 hour  Intake  672.5 ml  Output    850 ml  Net -177.5 ml    Intake/Output this shift:    Labs:  Recent Labs  06/19/14 0555 06/20/14 0522 06/21/14 0555  HGB 11.8* 12.2 11.6*    Recent Labs  06/20/14 0522 06/21/14 0555  WBC 8.9 15.6*  RBC 3.98 3.87  HCT 37.9 36.4  PLT 129* 178    Recent Labs  06/20/14 0522 06/21/14 0555  NA 142 146*  K 4.4 3.9  CL 113* 115*  CO2 16* 21  BUN 52* 41*  CREATININE 1.33* 1.05  GLUCOSE 113* 197*  CALCIUM 8.0* 8.8   No results for input(s): LABPT, INR in the last 72 hours.  EXAM General - Patient is Alert and Appropriate Extremity - Neurovascular intact No cellulitis present Compartment soft Dressing/Incision - clean, dry, no drainage Motor Function - intact, moving foot and toes well on exam.   Past Medical History  Diagnosis Date  . Hypertension   . Memory loss   . GERD (gastroesophageal reflux disease)   . RLS (restless legs syndrome)   . Hypertonicity of bladder   . Renal insufficiency   . Other headache syndromes(339.89)   . Osteoporosis, unspecified   . Urinary frequency   . Accelerated hypertension 07/09/2013  . Stage III chronic kidney disease 06/17/2014  . Hip fracture requiring operative repair 06/17/2014  . Primary osteoarthritis involving multiple joints 09/24/2013  . PVD (peripheral vascular disease) 11/07/2012  . Glaucoma 11/07/2012  . Depression  11/07/2012  . Overactive bladder 10/17/2012    Assessment/Plan: 2 Days Post-Op Procedure(s) (LRB): RIGHT HIP HEMI ANTERIOR APPROACH (Right) Principal Problem:   Hip fracture, right Active Problems:   Essential hypertension, benign   Glaucoma   Elevated troponin   Preoperative cardiovascular examination   Hip fracture requiring operative repair   Stage III chronic kidney disease   [place]and ischemia   Acute renal failure superimposed on stage 3 chronic kidney disease   Femoral neck fracture   Transaminitis   Atrial fibrillation with RVR   Up with therapy  Weight Bearing As Tolerated right Leg  Carrie Moran V 06/21/2014, 8:35 AM

## 2014-06-21 NOTE — Progress Notes (Signed)
ANTIBIOTIC CONSULT NOTE - INITIAL  Pharmacy Consult for vancomycin and cefepime Indication: pneumonia  Allergies  Allergen Reactions  . Sulfa Antibiotics     Unknown reaction.     Patient Measurements: Height: 5\' 1"  (154.9 cm) Weight: 106 lb 0.7 oz (48.1 kg) IBW/kg (Calculated) : 47.8   Vital Signs: Temp: 97.7 F (36.5 C) (03/19 1409) Temp Source: Oral (03/19 1409) BP: 149/108 mmHg (03/19 1409) Pulse Rate: 72 (03/19 1409) Intake/Output from previous day: 03/18 0701 - 03/19 0700 In: 672.5 [P.O.:180; I.V.:492.5] Out: 850 [Urine:850] Intake/Output from this shift: Total I/O In: -  Out: 900 [Urine:900]  Labs:  Recent Labs  06/19/14 0555 06/20/14 0522 06/21/14 0555  WBC 11.1* 8.9 15.6*  HGB 11.8* 12.2 11.6*  PLT 112* 129* 178  CREATININE 1.79* 1.33* 1.05   Estimated Creatinine Clearance: 24.7 mL/min (by C-G formula based on Cr of 1.05). No results for input(s): VANCOTROUGH, VANCOPEAK, VANCORANDOM, GENTTROUGH, GENTPEAK, GENTRANDOM, TOBRATROUGH, TOBRAPEAK, TOBRARND, AMIKACINPEAK, AMIKACINTROU, AMIKACIN in the last 72 hours.   Microbiology: Recent Results (from the past 720 hour(s))  MRSA PCR Screening     Status: None   Collection Time: 06/17/14  1:00 PM  Result Value Ref Range Status   MRSA by PCR NEGATIVE NEGATIVE Final    Comment:        The GeneXpert MRSA Assay (FDA approved for NASAL specimens only), is one component of a comprehensive MRSA colonization surveillance program. It is not intended to diagnose MRSA infection nor to guide or monitor treatment for MRSA infections.   Clostridium Difficile by PCR     Status: None   Collection Time: 06/21/14  2:55 PM  Result Value Ref Range Status   C difficile by pcr NEGATIVE NEGATIVE Final    Medical History: Past Medical History  Diagnosis Date  . Hypertension   . Memory loss   . GERD (gastroesophageal reflux disease)   . RLS (restless legs syndrome)   . Hypertonicity of bladder   . Renal  insufficiency   . Other headache syndromes(339.89)   . Osteoporosis, unspecified   . Urinary frequency   . Accelerated hypertension 07/09/2013  . Stage III chronic kidney disease 06/17/2014  . Hip fracture requiring operative repair 06/17/2014  . Primary osteoarthritis involving multiple joints 09/24/2013  . PVD (peripheral vascular disease) 11/07/2012  . Glaucoma 11/07/2012  . Depression 11/07/2012  . Overactive bladder 10/17/2012    Medications:   see med rec  Assessment: Patient is a 81 y.o F s/p R hip hemiarthroplasty on 3/17. CXR on 3/19 showed increasing RLL opacity compared to the last one obtained on 06/17/14. To start vancomycin and cefepime for suspected PNA.  Goal of Therapy:  Vancomycin trough level 15-20 mcg/ml  Plan:  - vancomycin 500mg  Iv q24h - cefepime 1gm IV q24h - f/u renal function and adjust dose if/when appropriate  Chrishon Martino P 06/21/2014,6:30 PM

## 2014-06-21 NOTE — Progress Notes (Addendum)
Although CXR today was interpreted as possible RLL atelectasis,when the chest x-ray is compared to that on 06/17/2014, the patient has increasing right lower lobe opacity.  with shortness of breath, new leukocytosis, and opacity. Given the patient's hypoxemia, increased sob-->start IV vanc and cefepime Foley also placed for urine retention-->send UA/culture.  Also ordered blood cultures x 2  DTat

## 2014-06-21 NOTE — Progress Notes (Signed)
SUBJECTIVE:  No complaints  OBJECTIVE:   Vitals:   Filed Vitals:   06/20/14 2217 06/21/14 0000 06/21/14 0400 06/21/14 0407  BP: 151/83   174/93  Pulse: 85   90  Temp: 98.6 F (37 C)   97.8 F (36.6 C)  TempSrc: Axillary   Axillary  Resp: 16 26 26 28   Height:      Weight:      SpO2: 97% 97% 96% 97%   I&O's:   Intake/Output Summary (Last 24 hours) at 06/21/14 0854 Last data filed at 06/20/14 2224  Gross per 24 hour  Intake  672.5 ml  Output    850 ml  Net -177.5 ml   TELEMETRY: Reviewed telemetry pt in NSR with PAC's:     PHYSICAL EXAM General: Well developed, well nourished, in no acute distress Head: Eyes PERRLA, No xanthomas.   Normal cephalic and atramatic  Lungs:   Clear bilaterally to auscultation and percussion. Heart:   HRRR S1 S2 Pulses are 2+ & equal. Abdomen: Bowel sounds are positive, abdomen soft and non-tender without masses  Extremities:   No clubbing, cyanosis or edema.  DP +1 Neuro: Alert and oriented X 3. Psych:  Good affect, responds appropriately   LABS: Basic Metabolic Panel:  Recent Labs  06/20/14 0522 06/21/14 0555  NA 142 146*  K 4.4 3.9  CL 113* 115*  CO2 16* 21  GLUCOSE 113* 197*  BUN 52* 41*  CREATININE 1.33* 1.05  CALCIUM 8.0* 8.8  MG  --  1.9   Liver Function Tests:  Recent Labs  06/19/14 0555  AST 50*  ALT 95*  ALKPHOS 54  BILITOT 0.7  PROT 5.4*  ALBUMIN 3.2*   No results for input(s): LIPASE, AMYLASE in the last 72 hours. CBC:  Recent Labs  06/20/14 0522 06/21/14 0555  WBC 8.9 15.6*  HGB 12.2 11.6*  HCT 37.9 36.4  MCV 95.2 94.1  PLT 129* 178   Cardiac Enzymes:  Recent Labs  06/18/14 1242 06/18/14 1859  TROPONINI 1.45* 0.96*   BNP: Invalid input(s): POCBNP D-Dimer: No results for input(s): DDIMER in the last 72 hours. Hemoglobin A1C: No results for input(s): HGBA1C in the last 72 hours. Fasting Lipid Panel: No results for input(s): CHOL, HDL, LDLCALC, TRIG, CHOLHDL, LDLDIRECT in the last  72 hours. Thyroid Function Tests: No results for input(s): TSH, T4TOTAL, T3FREE, THYROIDAB in the last 72 hours.  Invalid input(s): FREET3 Anemia Panel: No results for input(s): VITAMINB12, FOLATE, FERRITIN, TIBC, IRON, RETICCTPCT in the last 72 hours. Coag Panel:   Lab Results  Component Value Date   INR 1.37 06/17/2014   INR 1.03 05/02/2009    RADIOLOGY: Ct Abdomen Pelvis Wo Contrast  06/19/2014   CLINICAL DATA:  Abdominal pain.  Leukocytosis.  EXAM: CT ABDOMEN AND PELVIS WITHOUT CONTRAST  TECHNIQUE: Multidetector CT imaging of the abdomen and pelvis was performed following the standard protocol without IV contrast.  COMPARISON:  06/17/2014 radiographs  FINDINGS: There are unremarkable unenhanced appearances of the liver, spleen, and pancreas. There is a 2.0 cm calculus within the gallbladder lumen. There is no bile duct dilatation. The right kidney is hydronephrotic, with abrupt caliber transition at the ureteropelvic junction. This may represent a chronic UPJ obstruction. No obstructing calculus or mass is evident. The left kidney is normal. There is no evidence of bowel obstruction or extraluminal air. There is mild fluid and air distention of small and large bowel without caliber transition. There is a prominent rectal stool mass which  may represent a degree of fecal impaction. There is colonic diverticulosis. There is no evidence of diverticulitis or other focal acute inflammatory process in the abdomen or pelvis. There is no ascites.  In the lower chest there are small bilateral effusions, right greater than left. Lung bases are clear.  There is a subcapital right hip fracture. No other acute musculoskeletal abnormalities are evident. Moderately severe degenerative disc and facet changes are present from L2 through the sacrum.  IMPRESSION: 1. Cholelithiasis 2. Right hydronephrosis, perhaps a chronic UPJ obstruction. No obstructing mass or calculus is evident. 3. Diverticulosis 4. The rectum is  distended with stool, raising the question of fecal impaction. There is moderate distension of all of the small and large bowel without caliber transition. 5. Small bilateral pleural effusions 6. Subcapital right hip fracture   Electronically Signed   By: Andreas Newport M.D.   On: 06/19/2014 03:11   Dg Chest 1 View  06/17/2014   CLINICAL DATA:  Pain following fall  EXAM: CHEST  1 VIEW  COMPARISON:  October 12, 2013  FINDINGS: There is no edema or consolidation. Heart is slightly enlarged with pulmonary vascularity within normal limits. No adenopathy. There is atherosclerotic change in the aorta. No pneumothorax. No acute fracture. There is extensive arthropathy in each shoulder. There is evidence of old trauma involving the left third rib.  IMPRESSION: No edema or consolidation. No change in cardiac silhouette. No pneumothorax.   Electronically Signed   By: Lowella Grip III M.D.   On: 06/17/2014 08:09   Dg Pelvis 1-2 Views  06/20/2014   CLINICAL DATA:  Postop hip replacement  EXAM: PELVIS - 1-2 VIEW  COMPARISON:  06/19/2014  FINDINGS: Right hip hemiarthroplasty in satisfactory position alignment. No fracture or complication. No change from yesterday  IMPRESSION: Satisfactory right hip hemiarthroplasty.   Electronically Signed   By: Franchot Gallo M.D.   On: 06/20/2014 10:11   Dg Abd 1 View  06/17/2014   CLINICAL DATA:  Abdominal distension. History of renal insufficiency.  EXAM: ABDOMEN - 1 VIEW  COMPARISON:  CT 01/30/2010  FINDINGS: There is a displaced subcapital right femur fracture. Left hip appears intact. Large amount of gas and stool within the abdomen and pelvis. In particular, there is a large amount of gas in the mid abdomen. Limited evaluation for free air on this supine image. Levoscoliosis of the lumbar spine.  IMPRESSION: Large amount of gas and stool throughout the abdomen and pelvis.  Right subcapital femur fracture.   Electronically Signed   By: Markus Daft M.D.   On: 06/17/2014 20:15     Nm Hepatobiliary Liver Func  06/19/2014   CLINICAL DATA:  Cholelithiasis on CT.  Evaluate for cholecystitis.  EXAM: NUCLEAR MEDICINE HEPATOBILIARY IMAGING  TECHNIQUE: Sequential images of the abdomen were obtained out to 60 minutes following intravenous administration of radiopharmaceutical.  RADIOPHARMACEUTICALS:  5.5 Millicurie EH-20N Choletec  COMPARISON:  CT 06/18/2014  FINDINGS: There is prompt clearance radiotracer from the blood pool. Counts are evident within the small bowel by 30 minutes. Counts begin to fill the gallbladder by 18 minutes and continues to fill.  IMPRESSION: Filling of the gallbladder indicates patent cystic duct. No evidence of acute cholecystitis.   Electronically Signed   By: Suzy Bouchard M.D.   On: 06/19/2014 12:03   Pelvis Portable  06/19/2014   CLINICAL DATA:  Status post right hip replacement  EXAM: PORTABLE PELVIS 1-2 VIEWS  COMPARISON:  None.  FINDINGS: Right hip replacement is now  seen. No acute bony abnormality is seen. Soft tissue changes are noted consistent with the recent surgery. The pelvic ring is visualized is intact.  IMPRESSION: Status post right hip replacement without acute abnormality.   Electronically Signed   By: Inez Catalina M.D.   On: 06/19/2014 17:36   Dg Chest Port 1 View  06/20/2014   CLINICAL DATA:  Lower left chest pain.  Hypertension  EXAM: PORTABLE CHEST - 1 VIEW  COMPARISON:  June 17, 2014  FINDINGS: There is atelectatic change in the left base. There is underlying emphysema. Lungs elsewhere clear. Heart is mildly enlarged with pulmonary vascularity within normal limits. No adenopathy. No pneumothorax. There is atherosclerotic change in the aorta. Bones are osteoporotic.  IMPRESSION: Left base atelectasis.  Underlying emphysema.  Cardiomegaly.   Electronically Signed   By: Lowella Grip III M.D.   On: 06/20/2014 10:11   Dg C-arm 61-120 Min-no Report  06/19/2014   CLINICAL DATA: right anterior hip   C-ARM 61-120 MINUTES  Fluoroscopy  was utilized by the requesting physician.  No radiographic  interpretation.    Dg Hip Unilat With Pelvis 2-3 Views Right  06/17/2014   CLINICAL DATA:  Fall this morning with right hip pain and lower extremity foreshortening. Initial encounter.  EXAM: RIGHT HIP (WITH PELVIS) 2-3 VIEWS  COMPARISON:  None.  FINDINGS: There is evidence of an acute, complete subcapital fracture of the proximal femur with mild displacement. No dislocation. No bony lesions are seen. The bony pelvis is intact.  IMPRESSION: Acute subcapital fracture of the right hip.   Electronically Signed   By: Aletta Edouard M.D.   On: 06/17/2014 08:07   Dg Femur, Min 2 Views Right  06/17/2014   CLINICAL DATA:  Recent fall with known hip fracture  EXAM: RIGHT FEMUR 2 VIEWS  COMPARISON:  None.  FINDINGS: Subcapital femoral neck fracture is noted with some impaction at the fracture site. Degenerative changes about the knee joint are seen. Diffuse vascular calcifications are noted. No other fractures are seen.  IMPRESSION: Subcapital right femoral neck fracture. No other acute abnormality is seen.   Electronically Signed   By: Inez Catalina M.D.   On: 06/17/2014 12:09   US Abdomen Limited Ruq  06/18/2014   CLINICAL DATA:  Abnormal elevated liver enzymes, elevated creatinine and  EXAM: US ABDOMEN LIMITED - RIGHT UPPER QUADRANT  COMPARISON:  None.  FINDINGS: Gallbladder:  Distended to at least 7-8 mm. There is a 2.6 cm stone. Wall thickness is within normal limits at 2 mm and there is no Murphy's sign.  Common bile duct:  Diameter: Measures between 3-6 mm  Liver:  1 cm cyst in the left lobe. No biliary dilatation. No focal abnormalities.  IMPRESSION: Study limited by patient in mobility related to hip fracture. Gallbladder is difficult to completely image. There is a gallstone with no Murphy's sign.   Electronically Signed   By: Skipper Cliche M.D.   On: 06/18/2014 13:29   IMPRESSION/RECOMMENDATION:  1. Hip fracture-s/p repair 2. Elevated  troponin- this likely represents demand ischemia, especially since she's had no anginal symptoms but did peak at 1.55. 2D echo showed normal LVF with moderate LVH and severe basal septal hypertrophy and moderate TR with moderate pulmonary HTN. There was no evidence of intracavitary gradient. She could very well have underlying CAD but Dr. Debara Pickett spoke with her son, it was felt that it would be unlikely for her to agree to coronary catheterization she had stress testing and it  was abnormal. 2D echo with normal LVF and no RWMA's. No further workup at this time. She did well with hip surgery and has no complaints today. 3. Post op atrial fibrillation - started yesterday.  Now in NSR with frequent PAC's.  Agree patient is not a good candidate for long term anticoagulation.  D/C Cardizem and increase Lopressor to 50mg  BID    Sueanne Margarita, MD  06/21/2014  8:54 AM

## 2014-06-21 NOTE — Progress Notes (Signed)
PROGRESS NOTE  Carrie Moran UXN:[id number] DOB: 05/31/1917 DOA: 06/17/2014 PCP: Blanchie Serve, MD  Assessment/Plan: Atrial fibrillation with RVR -Developed on the morning of 06/20/2014 -Check TSH--pending -Restart aspirin 81 mg daily -The patient is not a good candidate for anticoagulation secondary to her mechanical falls--presently resulted in R-femoral neck fracture -CHADS-VASc = 5 -d/c amlodipine -start diltiazem drip-->po cardizem-->d/c per cardiology -06/21/14--metoprolol dose increased to 50 mg bid  Right Femoral Neck Fracture -appreciate ortho -prophylactic lovenox x 30 days -06/19/14--R-hemiarthroplasty -WBAT   Leukocytosis / transaminitis  - May have component of stress demargination-->resolved  - Abd US--distended gallbladder with 2.6 m stone, but difficult to assess gallbladder  - HIDA scan--neg  Chest xray--L-base atelecatasis.. Urinalysis negative for nitrites with small leukocytes and negative bacteria.  06/18/14--CT abdomen--R-side hydronephrosis @ UPJ, mild fluid and air distension on sm+lg bowel, rectal stool mass   Afebrile and hemodynamically stable  Stool for Cdiff  3/19--WBC increased--Recheck CBC in am  Upper airway wheeze -aersolized albuterol prn -will not start steroids  Acute on Chronic Stage III chronic kidney disease/Hydronephrosis R-renal  Creatinine 1.2-1.6 at baseline. Current creatinine 1.7  Due to dehyddration, although unclear if hydronephrosis has any contribution  R-hydronephrosis present on previous CTs dating back to 08/09/06  Appreciate urology opinion--no intervention needed presently  D/c diclofenac gel   Constipation / Abdominal distention  Check KUB--constipation  Fleets enema-->large BM, but still with abdominal pain  06/19/14--had another large BM   Essential hypertension, benign  Continue metoprolol and clonidine.   Glaucoma  Continue Xalatan.   Elevated  troponin/demand ischemia  Cardiology following.  2-D echo shows EF 65-70 percent with grade 1 diastolic dysfunction. No significant changes from prior 2-D echo, supporting a diagnosis of demand ischemia.   Peripheral vascular disease   Continue Pletal.    DVT prophylaxis  Lovenox ordered.  Code Status: DNR. Family Communication: UpdatedSon at the bedside 06/20/14 Disposition Plan: SNF when stable.         Procedures/Studies: Ct Abdomen Pelvis Wo Contrast  06/19/2014   CLINICAL DATA:  Abdominal pain.  Leukocytosis.  EXAM: CT ABDOMEN AND PELVIS WITHOUT CONTRAST  TECHNIQUE: Multidetector CT imaging of the abdomen and pelvis was performed following the standard protocol without IV contrast.  COMPARISON:  06/17/2014 radiographs  FINDINGS: There are unremarkable unenhanced appearances of the liver, spleen, and pancreas. There is a 2.0 cm calculus within the gallbladder lumen. There is no bile duct dilatation. The right kidney is hydronephrotic, with abrupt caliber transition at the ureteropelvic junction. This may represent a chronic UPJ obstruction. No obstructing calculus or mass is evident. The left kidney is normal. There is no evidence of bowel obstruction or extraluminal air. There is mild fluid and air distention of small and large bowel without caliber transition. There is a prominent rectal stool mass which may represent a degree of fecal impaction. There is colonic diverticulosis. There is no evidence of diverticulitis or other focal acute inflammatory process in the abdomen or pelvis. There is no ascites.  In the lower chest there are small bilateral effusions, right greater than left. Lung bases are clear.  There is a subcapital right hip fracture. No other acute musculoskeletal abnormalities are evident. Moderately severe degenerative disc and facet changes are present from L2 through the sacrum.  IMPRESSION: 1. Cholelithiasis 2. Right hydronephrosis, perhaps a chronic UPJ  obstruction. No obstructing mass or calculus is evident. 3. Diverticulosis 4. The rectum is distended with stool, raising the  question of fecal impaction. There is moderate distension of all of the small and large bowel without caliber transition. 5. Small bilateral pleural effusions 6. Subcapital right hip fracture   Electronically Signed   By: Andreas Newport M.D.   On: 06/19/2014 03:11   Dg Chest 1 View  06/17/2014   CLINICAL DATA:  Pain following fall  EXAM: CHEST  1 VIEW  COMPARISON:  October 12, 2013  FINDINGS: There is no edema or consolidation. Heart is slightly enlarged with pulmonary vascularity within normal limits. No adenopathy. There is atherosclerotic change in the aorta. No pneumothorax. No acute fracture. There is extensive arthropathy in each shoulder. There is evidence of old trauma involving the left third rib.  IMPRESSION: No edema or consolidation. No change in cardiac silhouette. No pneumothorax.   Electronically Signed   By: Lowella Grip III M.D.   On: 06/17/2014 08:09   Dg Pelvis 1-2 Views  06/20/2014   CLINICAL DATA:  Postop hip replacement  EXAM: PELVIS - 1-2 VIEW  COMPARISON:  06/19/2014  FINDINGS: Right hip hemiarthroplasty in satisfactory position alignment. No fracture or complication. No change from yesterday  IMPRESSION: Satisfactory right hip hemiarthroplasty.   Electronically Signed   By: Franchot Gallo M.D.   On: 06/20/2014 10:11   Dg Abd 1 View  06/17/2014   CLINICAL DATA:  Abdominal distension. History of renal insufficiency.  EXAM: ABDOMEN - 1 VIEW  COMPARISON:  CT 01/30/2010  FINDINGS: There is a displaced subcapital right femur fracture. Left hip appears intact. Large amount of gas and stool within the abdomen and pelvis. In particular, there is a large amount of gas in the mid abdomen. Limited evaluation for free air on this supine image. Levoscoliosis of the lumbar spine.  IMPRESSION: Large amount of gas and stool throughout the abdomen and pelvis.  Right  subcapital femur fracture.   Electronically Signed   By: Markus Daft M.D.   On: 06/17/2014 20:15   Nm Hepatobiliary Liver Func  06/19/2014   CLINICAL DATA:  Cholelithiasis on CT.  Evaluate for cholecystitis.  EXAM: NUCLEAR MEDICINE HEPATOBILIARY IMAGING  TECHNIQUE: Sequential images of the abdomen were obtained out to 60 minutes following intravenous administration of radiopharmaceutical.  RADIOPHARMACEUTICALS:  5.5 Millicurie OF-75Z Choletec  COMPARISON:  CT 06/18/2014  FINDINGS: There is prompt clearance radiotracer from the blood pool. Counts are evident within the small bowel by 30 minutes. Counts begin to fill the gallbladder by 18 minutes and continues to fill.  IMPRESSION: Filling of the gallbladder indicates patent cystic duct. No evidence of acute cholecystitis.   Electronically Signed   By: Suzy Bouchard M.D.   On: 06/19/2014 12:03   Pelvis Portable  06/19/2014   CLINICAL DATA:  Status post right hip replacement  EXAM: PORTABLE PELVIS 1-2 VIEWS  COMPARISON:  None.  FINDINGS: Right hip replacement is now seen. No acute bony abnormality is seen. Soft tissue changes are noted consistent with the recent surgery. The pelvic ring is visualized is intact.  IMPRESSION: Status post right hip replacement without acute abnormality.   Electronically Signed   By: Inez Catalina M.D.   On: 06/19/2014 17:36   Dg Chest Port 1 View  06/20/2014   CLINICAL DATA:  Lower left chest pain.  Hypertension  EXAM: PORTABLE CHEST - 1 VIEW  COMPARISON:  June 17, 2014  FINDINGS: There is atelectatic change in the left base. There is underlying emphysema. Lungs elsewhere clear. Heart is mildly enlarged with pulmonary vascularity within normal limits. No  adenopathy. No pneumothorax. There is atherosclerotic change in the aorta. Bones are osteoporotic.  IMPRESSION: Left base atelectasis.  Underlying emphysema.  Cardiomegaly.   Electronically Signed   By: Lowella Grip III M.D.   On: 06/20/2014 10:11   Dg C-arm 61-120  Min-no Report  06/19/2014   CLINICAL DATA: right anterior hip   C-ARM 61-120 MINUTES  Fluoroscopy was utilized by the requesting physician.  No radiographic  interpretation.    Dg Hip Unilat With Pelvis 2-3 Views Right  06/17/2014   CLINICAL DATA:  Fall this morning with right hip pain and lower extremity foreshortening. Initial encounter.  EXAM: RIGHT HIP (WITH PELVIS) 2-3 VIEWS  COMPARISON:  None.  FINDINGS: There is evidence of an acute, complete subcapital fracture of the proximal femur with mild displacement. No dislocation. No bony lesions are seen. The bony pelvis is intact.  IMPRESSION: Acute subcapital fracture of the right hip.   Electronically Signed   By: Aletta Edouard M.D.   On: 06/17/2014 08:07   Dg Femur, Min 2 Views Right  06/17/2014   CLINICAL DATA:  Recent fall with known hip fracture  EXAM: RIGHT FEMUR 2 VIEWS  COMPARISON:  None.  FINDINGS: Subcapital femoral neck fracture is noted with some impaction at the fracture site. Degenerative changes about the knee joint are seen. Diffuse vascular calcifications are noted. No other fractures are seen.  IMPRESSION: Subcapital right femoral neck fracture. No other acute abnormality is seen.   Electronically Signed   By: Inez Catalina M.D.   On: 06/17/2014 12:09   US Abdomen Limited Ruq  06/18/2014   CLINICAL DATA:  Abnormal elevated liver enzymes, elevated creatinine and  EXAM: US ABDOMEN LIMITED - RIGHT UPPER QUADRANT  COMPARISON:  None.  FINDINGS: Gallbladder:  Distended to at least 7-8 mm. There is a 2.6 cm stone. Wall thickness is within normal limits at 2 mm and there is no Murphy's sign.  Common bile duct:  Diameter: Measures between 3-6 mm  Liver:  1 cm cyst in the left lobe. No biliary dilatation. No focal abnormalities.  IMPRESSION: Study limited by patient in mobility related to hip fracture. Gallbladder is difficult to completely image. There is a gallstone with no Murphy's sign.   Electronically Signed   By: Skipper Cliche M.D.    On: 06/18/2014 13:29         Subjective: Patient states that she had a loose stool this morning. Denies any chest pain, shortness breath, vomiting, dysuria. Complains of some abdominal pain.  Objective: Filed Vitals:   06/21/14 0407 06/21/14 0800 06/21/14 1200 06/21/14 1409  BP: 174/93   149/108  Pulse: 90   72  Temp: 97.8 F (36.6 C)   97.7 F (36.5 C)  TempSrc: Axillary   Oral  Resp: 28 18 18 20   Height:      Weight:      SpO2: 97% 92%  97%    Intake/Output Summary (Last 24 hours) at 06/21/14 1453 Last data filed at 06/21/14 1400  Gross per 24 hour  Intake  612.5 ml  Output   1252 ml  Net -639.5 ml   Weight change:  Exam:   General:  Pt is alert, follows commands appropriately, not in acute distress  HEENT: No icterus, No thrush, [place]/AT  Cardiovascular: RRR, S1/S2, no rubs, +PACs  Respiratory: Bilateral crackles. Upper airway wheezing  Abdomen: Soft/+BS, non tender, non distended, no guarding  Extremities: trace LE edema, No lymphangitis, No petechiae, No rashes, no synovitis  Data Reviewed: Basic Metabolic Panel:  Recent Labs Lab 06/17/14 0750 06/18/14 0605 06/19/14 0555 06/20/14 0522 06/21/14 0555  NA 139 142 138 142 146*  K 4.0 4.3 4.2 4.4 3.9  CL 108 108 109 113* 115*  CO2 20 19 17* 16* 21  GLUCOSE 265* 120* 98 113* 197*  BUN 34* 48* 65* 52* 41*  CREATININE 1.41* 1.86* 1.79* 1.33* 1.05  CALCIUM 9.1 8.4 7.9* 8.0* 8.8  MG  --   --   --   --  1.9   Liver Function Tests:  Recent Labs Lab 06/17/14 0750 06/18/14 0605 06/19/14 0555  AST 362* 115* 50*  ALT 244* 157* 95*  ALKPHOS 78 58 54  BILITOT 1.4* 0.8 0.7  PROT 6.2 5.5* 5.4*  ALBUMIN 3.6 3.2* 3.2*   No results for input(s): LIPASE, AMYLASE in the last 168 hours. No results for input(s): AMMONIA in the last 168 hours. CBC:  Recent Labs Lab 06/17/14 0750 06/19/14 0555 06/20/14 0522 06/21/14 0555  WBC 22.2* 11.1* 8.9 15.6*  NEUTROABS 17.8*  --   --   --   HGB 13.9 11.8* 12.2  11.6*  HCT 45.1 37.0 37.9 36.4  MCV 97.2 95.6 95.2 94.1  PLT 160 112* 129* 178   Cardiac Enzymes:  Recent Labs Lab 06/18/14 0704 06/18/14 1242 06/18/14 1859  TROPONINI 1.55* 1.45* 0.96*   BNP: Invalid input(s): POCBNP CBG: No results for input(s): GLUCAP in the last 168 hours.  Recent Results (from the past 240 hour(s))  MRSA PCR Screening     Status: None   Collection Time: 06/17/14  1:00 PM  Result Value Ref Range Status   MRSA by PCR NEGATIVE NEGATIVE Final    Comment:        The GeneXpert MRSA Assay (FDA approved for NASAL specimens only), is one component of a comprehensive MRSA colonization surveillance program. It is not intended to diagnose MRSA infection nor to guide or monitor treatment for MRSA infections.      Scheduled Meds: . aspirin  81 mg Oral Daily  . cilostazol  50 mg Oral BID  . cloNIDine  0.1 mg Oral BID  . enoxaparin (LOVENOX) injection  30 mg Subcutaneous Q24H  . gabapentin  100 mg Oral QHS  . latanoprost  1 drop Both Eyes Daily  . metoprolol tartrate  50 mg Oral BID  . mirabegron ER  25 mg Oral Daily  . pantoprazole  40 mg Oral Daily  . polyethylene glycol  17 g Oral Daily  . timolol  1 drop Both Eyes Daily  . topiramate  25 mg Oral BID   Continuous Infusions:    Jaydien Panepinto, DO  [facility] Pager [phone]  If 7PM-7AM, please contact night-coverage [email or web address] Password [email or web address] 06/21/2014, 2:53 PM   LOS: 4 days

## 2014-06-21 NOTE — Clinical Social Work Note (Signed)
CSW spoke with pt's son to discuss SNF choice  Pt's son had some confusion in speaking with an ER CSW yesterday regarding a placement at [facility] being told she was not accepted  CSW cleared up confusion and did let pt's son know that [facility] had said yes to pt's rehab needs  CSW sent message in carefinders system to let [facility] know that pt had chosen them as her SNF for rehab before going back to [place] will continue to follow pt until discharge  .Dede Query, LCSW [facility] Clinical Social Worker - Weekend Coverage cell #: [phone]

## 2014-06-22 DIAGNOSIS — J189 Pneumonia, unspecified organism: Secondary | ICD-10-CM

## 2014-06-22 LAB — CBC
HCT: 31.9 % — ABNORMAL LOW (ref 36.0–46.0)
Hemoglobin: 10.3 g/dL — ABNORMAL LOW (ref 12.0–15.0)
MCH: 30.7 pg (ref 26.0–34.0)
MCHC: 32.3 g/dL (ref 30.0–36.0)
MCV: 94.9 fL (ref 78.0–100.0)
Platelets: 125 10*3/uL — ABNORMAL LOW (ref 150–400)
RBC: 3.36 MIL/uL — AB (ref 3.87–5.11)
RDW: 13.9 % (ref 11.5–15.5)
WBC: 7.5 10*3/uL (ref 4.0–10.5)

## 2014-06-22 LAB — BASIC METABOLIC PANEL
ANION GAP: 8 (ref 5–15)
BUN: 36 mg/dL — AB (ref 6–23)
CO2: 23 mmol/L (ref 19–32)
Calcium: 8.5 mg/dL (ref 8.4–10.5)
Chloride: 116 mmol/L — ABNORMAL HIGH (ref 96–112)
Creatinine, Ser: 0.85 mg/dL (ref 0.50–1.10)
GFR calc Af Amer: 66 mL/min — ABNORMAL LOW (ref >90)
GFR, EST NON AFRICAN AMERICAN: 57 mL/min — AB (ref >90)
GLUCOSE: 114 mg/dL — AB (ref 70–99)
Potassium: 3.5 mmol/L (ref 3.5–5.1)
Sodium: 147 mmol/L — ABNORMAL HIGH (ref 135–145)

## 2014-06-22 LAB — TSH: TSH: 3.66 u[IU]/mL (ref 0.350–4.500)

## 2014-06-22 MED ORDER — LABETALOL HCL 5 MG/ML IV SOLN
10.0000 mg | Freq: Once | INTRAVENOUS | Status: AC
  Administered 2014-06-22: 10 mg via INTRAVENOUS
  Filled 2014-06-22: qty 4

## 2014-06-22 MED ORDER — METOPROLOL TARTRATE 50 MG PO TABS
75.0000 mg | ORAL_TABLET | Freq: Two times a day (BID) | ORAL | Status: DC
  Administered 2014-06-22 – 2014-06-24 (×5): 75 mg via ORAL
  Filled 2014-06-22 (×11): qty 1

## 2014-06-22 MED ORDER — ALBUTEROL SULFATE (2.5 MG/3ML) 0.083% IN NEBU: 2.5000 mg | INHALATION_SOLUTION | RESPIRATORY_TRACT | Status: AC | PRN

## 2014-06-22 MED ORDER — DEXTROSE 5 % IV BOLUS
500.0000 mL | Freq: Once | INTRAVENOUS | Status: AC
  Administered 2014-06-22: 500 mL via INTRAVENOUS

## 2014-06-22 NOTE — Progress Notes (Signed)
SUBJECTIVE:  No complaints  OBJECTIVE:   Vitals:   Filed Vitals:   06/21/14 2139 06/22/14 0000 06/22/14 0444 06/22/14 0731  BP: 172/112  125/64   Pulse: 93  68   Temp: 98 F (36.7 C)  97.5 F (36.4 C)   TempSrc: Axillary  Axillary   Resp: 20 20 20 16   Height:      Weight:      SpO2: 100% 98% 98% 97%   I&O's:   Intake/Output Summary (Last 24 hours) at 06/22/14 0900 Last data filed at 06/22/14 0450  Gross per 24 hour  Intake     50 ml  Output   2225 ml  Net  -2175 ml   TELEMETRY: Reviewed telemetry pt in NSR with PAC's with several short bursts of atrial tachcyardia     PHYSICAL EXAM General: Well developed, well nourished, in no acute distress Head: Eyes PERRLA, No xanthomas.   Normal cephalic and atramatic  Lungs:   Clear bilaterally to auscultation and percussion. Heart:  RRR with no M/R/G Abdomen: Bowel sounds are positive, abdomen soft and non-tender without masses Extremities:   No clubbing, cyanosis or edema.  DP +1 Neuro: Alert and oriented X 3. Psych:  Good affect, responds appropriately   LABS: Basic Metabolic Panel:  Recent Labs  06/21/14 0555 06/22/14 0607  NA 146* 147*  K 3.9 3.5  CL 115* 116*  CO2 21 23  GLUCOSE 197* 114*  BUN 41* 36*  CREATININE 1.05 0.85  CALCIUM 8.8 8.5  MG 1.9  --    Liver Function Tests: No results for input(s): AST, ALT, ALKPHOS, BILITOT, PROT, ALBUMIN in the last 72 hours. No results for input(s): LIPASE, AMYLASE in the last 72 hours. CBC:  Recent Labs  06/21/14 0555 06/22/14 0607  WBC 15.6* 7.5  HGB 11.6* 10.3*  HCT 36.4 31.9*  MCV 94.1 94.9  PLT 178 125*   Cardiac Enzymes: No results for input(s): CKTOTAL, CKMB, CKMBINDEX, TROPONINI in the last 72 hours. BNP: Invalid input(s): POCBNP D-Dimer: No results for input(s): DDIMER in the last 72 hours. Hemoglobin A1C: No results for input(s): HGBA1C in the last 72 hours. Fasting Lipid Panel: No results for input(s): CHOL, HDL, LDLCALC, TRIG, CHOLHDL,  LDLDIRECT in the last 72 hours. Thyroid Function Tests:  Recent Labs  06/22/14 0607  TSH 3.660   Anemia Panel: No results for input(s): VITAMINB12, FOLATE, FERRITIN, TIBC, IRON, RETICCTPCT in the last 72 hours. Coag Panel:   Lab Results  Component Value Date   INR 1.37 06/17/2014   INR 1.03 05/02/2009    RADIOLOGY: Ct Abdomen Pelvis Wo Contrast  06/19/2014   CLINICAL DATA:  Abdominal pain.  Leukocytosis.  EXAM: CT ABDOMEN AND PELVIS WITHOUT CONTRAST  TECHNIQUE: Multidetector CT imaging of the abdomen and pelvis was performed following the standard protocol without IV contrast.  COMPARISON:  06/17/2014 radiographs  FINDINGS: There are unremarkable unenhanced appearances of the liver, spleen, and pancreas. There is a 2.0 cm calculus within the gallbladder lumen. There is no bile duct dilatation. The right kidney is hydronephrotic, with abrupt caliber transition at the ureteropelvic junction. This may represent a chronic UPJ obstruction. No obstructing calculus or mass is evident. The left kidney is normal. There is no evidence of bowel obstruction or extraluminal air. There is mild fluid and air distention of small and large bowel without caliber transition. There is a prominent rectal stool mass which may represent a degree of fecal impaction. There is colonic diverticulosis. There is no  evidence of diverticulitis or other focal acute inflammatory process in the abdomen or pelvis. There is no ascites.  In the lower chest there are small bilateral effusions, right greater than left. Lung bases are clear.  There is a subcapital right hip fracture. No other acute musculoskeletal abnormalities are evident. Moderately severe degenerative disc and facet changes are present from L2 through the sacrum.  IMPRESSION: 1. Cholelithiasis 2. Right hydronephrosis, perhaps a chronic UPJ obstruction. No obstructing mass or calculus is evident. 3. Diverticulosis 4. The rectum is distended with stool, raising the  question of fecal impaction. There is moderate distension of all of the small and large bowel without caliber transition. 5. Small bilateral pleural effusions 6. Subcapital right hip fracture   Electronically Signed   By: Andreas Newport M.D.   On: 06/19/2014 03:11   Dg Chest 1 View  06/17/2014   CLINICAL DATA:  Pain following fall  EXAM: CHEST  1 VIEW  COMPARISON:  October 12, 2013  FINDINGS: There is no edema or consolidation. Heart is slightly enlarged with pulmonary vascularity within normal limits. No adenopathy. There is atherosclerotic change in the aorta. No pneumothorax. No acute fracture. There is extensive arthropathy in each shoulder. There is evidence of old trauma involving the left third rib.  IMPRESSION: No edema or consolidation. No change in cardiac silhouette. No pneumothorax.   Electronically Signed   By: Lowella Grip III M.D.   On: 06/17/2014 08:09   Dg Pelvis 1-2 Views  06/20/2014   CLINICAL DATA:  Postop hip replacement  EXAM: PELVIS - 1-2 VIEW  COMPARISON:  06/19/2014  FINDINGS: Right hip hemiarthroplasty in satisfactory position alignment. No fracture or complication. No change from yesterday  IMPRESSION: Satisfactory right hip hemiarthroplasty.   Electronically Signed   By: Franchot Gallo M.D.   On: 06/20/2014 10:11   Dg Abd 1 View  06/17/2014   CLINICAL DATA:  Abdominal distension. History of renal insufficiency.  EXAM: ABDOMEN - 1 VIEW  COMPARISON:  CT 01/30/2010  FINDINGS: There is a displaced subcapital right femur fracture. Left hip appears intact. Large amount of gas and stool within the abdomen and pelvis. In particular, there is a large amount of gas in the mid abdomen. Limited evaluation for free air on this supine image. Levoscoliosis of the lumbar spine.  IMPRESSION: Large amount of gas and stool throughout the abdomen and pelvis.  Right subcapital femur fracture.   Electronically Signed   By: Markus Daft M.D.   On: 06/17/2014 20:15   Nm Hepatobiliary Liver  Func  06/19/2014   CLINICAL DATA:  Cholelithiasis on CT.  Evaluate for cholecystitis.  EXAM: NUCLEAR MEDICINE HEPATOBILIARY IMAGING  TECHNIQUE: Sequential images of the abdomen were obtained out to 60 minutes following intravenous administration of radiopharmaceutical.  RADIOPHARMACEUTICALS:  5.5 [employer] [id number] Choletec  COMPARISON:  CT 06/18/2014  FINDINGS: There is prompt clearance radiotracer from the blood pool. Counts are evident within the small bowel by 30 minutes. Counts begin to fill the gallbladder by 18 minutes and continues to fill.  IMPRESSION: Filling of the gallbladder indicates patent cystic duct. No evidence of acute cholecystitis.   Electronically Signed   By: Suzy Bouchard M.D.   On: 06/19/2014 12:03   Pelvis Portable  06/19/2014   CLINICAL DATA:  Status post right hip replacement  EXAM: PORTABLE PELVIS 1-2 VIEWS  COMPARISON:  None.  FINDINGS: Right hip replacement is now seen. No acute bony abnormality is seen. Soft tissue changes are noted consistent with the  recent surgery. The pelvic ring is visualized is intact.  IMPRESSION: Status post right hip replacement without acute abnormality.   Electronically Signed   By: Inez Catalina M.D.   On: 06/19/2014 17:36   Dg Chest Port 1 View  06/21/2014   CLINICAL DATA:  Hypertension.  Short of breath today.  EXAM: PORTABLE CHEST - 1 VIEW  COMPARISON:  06/20/2014  FINDINGS: Mild enlargement of the cardiopericardial silhouette, stable. No mediastinal or hilar masses. Mild basilar opacity likely due to atelectasis. Possible small effusions. No overt pulmonary edema or convincing pneumonia. No pneumothorax.  Bony thorax is demineralized but grossly intact.  IMPRESSION: Mild lung base opacity likely atelectasis. Probable associated small effusions. No overt pulmonary edema or convincing pneumonia. Findings are similar to the previous day's study.   Electronically Signed   By: Lajean Manes M.D.   On: 06/21/2014 16:39   Dg Chest Port 1  View  06/20/2014   CLINICAL DATA:  Lower left chest pain.  Hypertension  EXAM: PORTABLE CHEST - 1 VIEW  COMPARISON:  June 17, 2014  FINDINGS: There is atelectatic change in the left base. There is underlying emphysema. Lungs elsewhere clear. Heart is mildly enlarged with pulmonary vascularity within normal limits. No adenopathy. No pneumothorax. There is atherosclerotic change in the aorta. Bones are osteoporotic.  IMPRESSION: Left base atelectasis.  Underlying emphysema.  Cardiomegaly.   Electronically Signed   By: Lowella Grip III M.D.   On: 06/20/2014 10:11   Dg C-arm 61-120 Min-no Report  06/19/2014   CLINICAL DATA: right anterior hip   C-ARM 61-120 MINUTES  Fluoroscopy was utilized by the requesting physician.  No radiographic  interpretation.    Dg Hip Unilat With Pelvis 2-3 Views Right  06/17/2014   CLINICAL DATA:  Fall this morning with right hip pain and lower extremity foreshortening. Initial encounter.  EXAM: RIGHT HIP (WITH PELVIS) 2-3 VIEWS  COMPARISON:  None.  FINDINGS: There is evidence of an acute, complete subcapital fracture of the proximal femur with mild displacement. No dislocation. No bony lesions are seen. The bony pelvis is intact.  IMPRESSION: Acute subcapital fracture of the right hip.   Electronically Signed   By: Aletta Edouard M.D.   On: 06/17/2014 08:07   Dg Femur, Min 2 Views Right  06/17/2014   CLINICAL DATA:  Recent fall with known hip fracture  EXAM: RIGHT FEMUR 2 VIEWS  COMPARISON:  None.  FINDINGS: Subcapital femoral neck fracture is noted with some impaction at the fracture site. Degenerative changes about the knee joint are seen. Diffuse vascular calcifications are noted. No other fractures are seen.  IMPRESSION: Subcapital right femoral neck fracture. No other acute abnormality is seen.   Electronically Signed   By: Inez Catalina M.D.   On: 06/17/2014 12:09   US Abdomen Limited Ruq  06/18/2014   CLINICAL DATA:  Abnormal elevated liver enzymes, elevated  creatinine and  EXAM: US ABDOMEN LIMITED - RIGHT UPPER QUADRANT  COMPARISON:  None.  FINDINGS: Gallbladder:  Distended to at least 7-8 mm. There is a 2.6 cm stone. Wall thickness is within normal limits at 2 mm and there is no Murphy's sign.  Common bile duct:  Diameter: Measures between 3-6 mm  Liver:  1 cm cyst in the left lobe. No biliary dilatation. No focal abnormalities.  IMPRESSION: Study limited by patient in mobility related to hip fracture. Gallbladder is difficult to completely image. There is a gallstone with no Murphy's sign.   Electronically Signed  By: Skipper Cliche M.D.   On: 06/18/2014 13:29     IMPRESSION/RECOMMENDATION:  1. Hip fracture-s/p repair 2.   Elevated troponin- this likely represents demand ischemia, especially since she's had no anginal symptoms but did peak at 1.55. 2D echo showed normal LVF with moderate LVH and severe basal septal hypertrophy and moderate TR with moderate pulmonary HTN. There was no evidence of intracavitary gradient. She could very well have underlying CAD but Dr. Debara Pickett spoke with her son, it was felt that it would be unlikely for her to agree to coronary catheterization she had stress testing and it was abnormal. 2D echo with normal LVF and no RWMA's. No further workup at this time. She did well with hip surgery and has no complaints today. 3.   Post op atrial fibrillation - started POD #1. Now in NSR with frequent PAC's and several short bursts of nonsustained atrial tachycardia. Agree patient is not a good candidate for long term anticoagulation. continue  Lopressor to 50mg  BID.  Will increase Lopressor to 75mg  BID for suppression of atrial tachycardia.    Sueanne Margarita, MD  06/22/2014  9:00 AM

## 2014-06-22 NOTE — Discharge Summary (Addendum)
Physician Discharge Summary  Carrie Moran QMV:[id number] DOB: 1917-04-21 DOA: 06/17/2014  PCP: Blanchie Serve, MD  Admit date: 06/17/2014 Discharge date: 06/24/14 Recommendations for Outpatient Follow-up:  1. Pt will need to follow up with PCP in 2 weeks post discharge 2. Please obtain BMP and CBC on 06/27/14 3. Please give patient one final dose of levofloxacin 750mg  on 06/26/14  Discharge Diagnoses:  HCAP -06/21/14--patient developed increasing shortness of breath, wheezing, WBC up to 15.6 -06/21/2014 CXR--increasing right lower lobe opacity when compared to 06/17/14 -06/21/14--start cefepime and vancomycin -Follow up blood cultures--neg to date  -3/20--Respiratory status stable on room air presently--breathing better than on 06/21/2014 -Bronchodilators when necessary wheezing  Atrial fibrillation with RVR -Developed on the morning of 06/20/2014 -Check TSH--3.660 -Restart aspirin 81 mg daily -The patient is not a good candidate for anticoagulation secondary to her mechanical falls--presently resulted in R-femoral neck fracture -CHADS-VASc = 5 -d/c amlodipine initially when cardizem was started -start diltiazem drip-->po cardizem-->d/c per cardiology -06/21/14--metoprolol dose increased to 50 mg bid-->75mg  bid on 3/20 -cardiology started pt on amiodarone 200mg  bid  Right Femoral Neck Fracture -appreciate ortho -prophylactic lovenox x 30 days -06/19/14--R-hemiarthroplasty -WBAT   Leukocytosis / transaminitis/HCAP  - May have component of stress demargination--> initially resolved  - Abd US--distended gallbladder with 2.6 m stone, but difficult to assess gallbladder  - HIDA scan--neg  3/15--Chest xray--L-base atelecatasis.. Urinalysis negative for nitrites with small leukocytes and negative bacteria.  06/18/14--CT abdomen--R-side hydronephrosis @ UPJ, mild fluid and air distension on sm+lg bowel, rectal stool mass   Afebrile and hemodynamically  stable  Stool for Cdiff--neg  3/19--WBC increased--likely due to HCAP  3/19--CXR--worsening RLL opacity compared to 06/17/14, UA with pyruia  3/19--started empiric vanco and cefepime  06/23/14--d/c vanco, continue cefepime for uti pending final susceptibility UTI--pseudomonas -plan 3 more Levofloxacin days of antibiotics to complete 7 days of therapy for HCAP (given renal function--last dose on 3/24)--this will also finish tx for HCAP Upper airway wheeze -aersolized albuterol prn -will not start steroids--gets better with nebs -06/22/14--improved  LUE edema -venous duplex r/o DVT--neg for superficial or DVT  Acute on Chronic Stage III chronic kidney disease/Hydronephrosis R-renal  Creatinine 0.9-1.2 at baseline.   Due to dehyddration, although unclear if hydronephrosis has any contribution  R-hydronephrosis present on previous CTs dating back to 08/09/06  Appreciate urology opinion--no intervention needed presently  D/c diclofenac gel  Renal function improved back to baseline.   Constipation / Abdominal distention  Check KUB--constipation  Fleets enema-->large BM, but still with abdominal pain  06/19/14--had another large BM   Essential hypertension, benign  Continue metoprolol and clonidine.  Restart amlodipine   Glaucoma  Continue Xalatan.   Elevated troponin/demand ischemia  Cardiology following.  2-D echo shows EF 65-70 percent with grade 1 diastolic dysfunction. No significant changes from prior 2-D echo, supporting a diagnosis of demand ischemia.   Peripheral vascular disease   Continue Pletal.  Hypernatremia -start D5W-->improved    DVT prophylaxis  Lovenox ordered.  Code Status: DNR.   Discharge Condition: stable  Disposition: SNF Follow-up Information    Follow up with Swinteck, Horald Pollen, MD. Go on 07/08/2014.   Specialty:  Orthopedic Surgery   Why:  at  10:00am   For wound re-check   Contact information:   [address] [address] [phone]       Follow up with Pixie Casino, MD. Go on 07/29/2014.   Specialty:  Cardiology   Why:  at 10:45am.  For Post-op  Afib.  [employer] and List of current Medications.  Arrive 30 min prior to the appt (10:15am)   Contact information:   [place] [address] [phone]       Diet:regular Wt Readings from Last 3 Encounters:  06/17/14 48.1 kg (106 lb 0.7 oz)  06/11/14 47.446 kg (104 lb 9.6 oz)  06/06/14 45.813 kg (101 lb)    History of present illness:  81 y.o. female with a PMH of dementia, hypertension and severe LVH with an EF of 65% by echocardiography in 2011 who presented with a fall and right hip pain. No angina. 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 She denies any syncope or loss of consciousness. X-rays show a right subcapital hip fracture. As part of her workup her troponin resulted as positive at 0.49. White blood cell count is elevated at 22,000 with a left shift. UA is negative and chest x-ray is clear. There is no known history of coronary disease. Cardiology has seen the patient for pre-operative clearance given her elevated troponins, with recommendations to proceed with 2 D Echo but to proceed with surgery after this is completed as further preop testing will delay her surgery and recovery. The patient subsequently underwent a right hip hemiarthroplasty. Postoperative course was contacted Carrie Moran fibrillation with RVR. The patient was initially started on a Cardizem drip. The patient spontaneously converted back to sinus rhythm. Her metoprolol dosing was increased. The patient would still have intermittent episodes of atrial tachycardia. The patient's metoprolol dose was increased to 75 mg twice a day by cardiology which improved her episodes of tachycardia. In addition, the patient developed wheezing and shortness  of breath and leukocytosis. Chest x-ray revealed  HCAP.  Blood cultures were obtained and the patient was started on vancomycin and cefepime. Blood cultures were negative. Urine cultures grew gram-negative rods with pyuria. Vancomycin was ultimately discontinued. The patient received 3 days of cefepime. Her respiratory status improved. The patient will finish 3 additional days of antibiotics to complete 1 week of therapy. The patient clinically improved. Physical therapy was consulted. They recommended skilled nursing facility.  Consultants: Ortho--[facility]  Discharge Exam: Filed Vitals:   06/24/14 1206  BP: 162/81  Pulse: 69  Temp:   Resp:    Filed Vitals:   06/24/14 0726 06/24/14 1017 06/24/14 1107 06/24/14 1206  BP:  196/107  162/81  Pulse:  83  69  Temp:      TempSrc:      Resp: 18 18 18    Height:      Weight:      SpO2: 98% 97% 97%    General: Awake and alert, NAD, pleasant, cooperative Cardiovascular: RRR, no rub, no gallop, no S3 Respiratory: Bibasilar crackles. No wheeze Abdomen:soft, nontender, nondistended, positive bowel sounds Extremities: 2+ UE edema, No lymphangitis, no petechiae  Discharge Instructions      Discharge Instructions    Diet - low sodium heart healthy    Complete by:  As directed      Diet - low sodium heart healthy    Complete by:  As directed      Increase activity slowly    Complete by:  As directed      Increase activity slowly    Complete by:  As directed             Medication List    STOP taking these medications  diclofenac sodium 1 % Gel  Commonly known as:  VOLTAREN     mirtazapine 15 MG tablet  Commonly known as:  REMERON     traMADol 50 MG tablet  Commonly known as:  ULTRAM      TAKE these medications        albuterol (2.5 MG/3ML) 0.083% nebulizer solution  Commonly known as:  PROVENTIL  Take 3 mLs (2.5 mg total) by nebulization every 4 (four) hours as needed for wheezing or shortness of breath.       AMBULATORY NON FORMULARY MEDICATION  Ted compression stockings for both legs- wear them in the morning and remove it before going to bed     amiodarone 200 MG tablet  Commonly known as:  PACERONE  Take 1 tablet (200 mg total) by mouth 2 (two) times daily.     amLODipine 10 MG tablet  Commonly known as:  NORVASC  Take one tablet once daily to control Blood pressure     aspirin 81 MG tablet  Take 81 mg by mouth daily.     cilostazol 50 MG tablet  Commonly known as:  PLETAL  Take 50 mg by mouth 2 (two) times daily.     cloNIDine 0.1 MG tablet  Commonly known as:  CATAPRES  Take 1 tablet (0.1 mg total) by mouth 2 (two) times daily.     gabapentin 100 MG capsule  Commonly known as:  NEURONTIN  Take 1 capsule (100 mg total) by mouth at bedtime.     HYDROcodone-acetaminophen 5-325 MG per tablet  Commonly known as:  NORCO/VICODIN  Take 1-2 tablets by mouth every 6 (six) hours as needed for moderate pain.     latanoprost 0.005 % ophthalmic solution  Commonly known as:  XALATAN  Place 1 drop into both eyes daily. Instill 1 drop in each eye once daily for glaucoma.     levofloxacin 750 MG tablet  Commonly known as:  LEVAQUIN  Take 1 tablet (750 mg total) by mouth once. On 06/26/14  Start taking on:  06/26/2014     metoprolol tartrate 25 MG tablet  Commonly known as:  LOPRESSOR  Take 3 tablets (75 mg total) by mouth 2 (two) times daily.     mirabegron ER 25 MG Tb24 tablet  Commonly known as:  MYRBETRIQ  Take 1 tablet (25 mg total) by mouth daily.     omeprazole 20 MG capsule  Commonly known as:  PRILOSEC  Take 1 capsule (20 mg total) by mouth daily.     potassium chloride 20 MEQ/15ML (10%) Soln  Take 15 mLs (20 mEq total) by mouth daily. X 4 days  Start taking on:  06/26/2014     senna-docusate 8.6-50 MG per tablet  Commonly known as:  Senokot-S  Take 2 tablets by mouth daily as needed for mild constipation.     Timolol Maleate 0.5 % (DAILY) Soln  Place 1 drop into  both eyes daily.     topiramate 25 MG tablet  Commonly known as:  TOPAMAX  Take 1 tablet (25 mg total) by mouth 2 (two) times daily.         The results of significant diagnostics from this hospitalization (including imaging, microbiology, ancillary and laboratory) are listed below for reference.    Significant Diagnostic Studies: Ct Abdomen Pelvis Wo Contrast  06/19/2014   CLINICAL DATA:  Abdominal pain.  Leukocytosis.  EXAM: CT ABDOMEN AND PELVIS WITHOUT CONTRAST  TECHNIQUE: Multidetector CT imaging of the abdomen and pelvis  was performed following the standard protocol without IV contrast.  COMPARISON:  06/17/2014 radiographs  FINDINGS: There are unremarkable unenhanced appearances of the liver, spleen, and pancreas. There is a 2.0 cm calculus within the gallbladder lumen. There is no bile duct dilatation. The right kidney is hydronephrotic, with abrupt caliber transition at the ureteropelvic junction. This may represent a chronic UPJ obstruction. No obstructing calculus or mass is evident. The left kidney is normal. There is no evidence of bowel obstruction or extraluminal air. There is mild fluid and air distention of small and large bowel without caliber transition. There is a prominent rectal stool mass which may represent a degree of fecal impaction. There is colonic diverticulosis. There is no evidence of diverticulitis or other focal acute inflammatory process in the abdomen or pelvis. There is no ascites.  In the lower chest there are small bilateral effusions, right greater than left. Lung bases are clear.  There is a subcapital right hip fracture. No other acute musculoskeletal abnormalities are evident. Moderately severe degenerative disc and facet changes are present from L2 through the sacrum.  IMPRESSION: 1. Cholelithiasis 2. Right hydronephrosis, perhaps a chronic UPJ obstruction. No obstructing mass or calculus is evident. 3. Diverticulosis 4. The rectum is distended with stool,  raising the question of fecal impaction. There is moderate distension of all of the small and large bowel without caliber transition. 5. Small bilateral pleural effusions 6. Subcapital right hip fracture   Electronically Signed   By: Andreas Newport M.D.   On: 06/19/2014 03:11   Dg Chest 1 View  06/17/2014   CLINICAL DATA:  Pain following fall  EXAM: CHEST  1 VIEW  COMPARISON:  October 12, 2013  FINDINGS: There is no edema or consolidation. Heart is slightly enlarged with pulmonary vascularity within normal limits. No adenopathy. There is atherosclerotic change in the aorta. No pneumothorax. No acute fracture. There is extensive arthropathy in each shoulder. There is evidence of old trauma involving the left third rib.  IMPRESSION: No edema or consolidation. No change in cardiac silhouette. No pneumothorax.   Electronically Signed   By: Lowella Grip III M.D.   On: 06/17/2014 08:09   Dg Pelvis 1-2 Views  06/20/2014   CLINICAL DATA:  Postop hip replacement  EXAM: PELVIS - 1-2 VIEW  COMPARISON:  06/19/2014  FINDINGS: Right hip hemiarthroplasty in satisfactory position alignment. No fracture or complication. No change from yesterday  IMPRESSION: Satisfactory right hip hemiarthroplasty.   Electronically Signed   By: Franchot Gallo M.D.   On: 06/20/2014 10:11   Dg Abd 1 View  06/17/2014   CLINICAL DATA:  Abdominal distension. History of renal insufficiency.  EXAM: ABDOMEN - 1 VIEW  COMPARISON:  CT 01/30/2010  FINDINGS: There is a displaced subcapital right femur fracture. Left hip appears intact. Large amount of gas and stool within the abdomen and pelvis. In particular, there is a large amount of gas in the mid abdomen. Limited evaluation for free air on this supine image. Levoscoliosis of the lumbar spine.  IMPRESSION: Large amount of gas and stool throughout the abdomen and pelvis.  Right subcapital femur fracture.   Electronically Signed   By: Markus Daft M.D.   On: 06/17/2014 20:15   Nm Hepatobiliary  Liver Func  06/19/2014   CLINICAL DATA:  Cholelithiasis on CT.  Evaluate for cholecystitis.  EXAM: NUCLEAR MEDICINE HEPATOBILIARY IMAGING  TECHNIQUE: Sequential images of the abdomen were obtained out to 60 minutes following intravenous administration of radiopharmaceutical.  RADIOPHARMACEUTICALS:  5.5 Mill[place]urie IP-38S Choletec  COMPARISON:  CT 06/18/2014  FINDINGS: There is prompt clearance radiotracer from the blood pool. Counts are evident within the small bowel by 30 minutes. Counts begin to fill the gallbladder by 18 minutes and continues to fill.  IMPRESSION: Filling of the gallbladder indicates patent cystic duct. No evidence of acute cholecystitis.   Electronically Signed   By: Suzy Bouchard M.D.   On: 06/19/2014 12:03   Pelvis Portable  06/19/2014   CLINICAL DATA:  Status post right hip replacement  EXAM: PORTABLE PELVIS 1-2 VIEWS  COMPARISON:  None.  FINDINGS: Right hip replacement is now seen. No acute bony abnormality is seen. Soft tissue changes are noted consistent with the recent surgery. The pelvic ring is visualized is intact.  IMPRESSION: Status post right hip replacement without acute abnormality.   Electronically Signed   By: Inez Catalina M.D.   On: 06/19/2014 17:36   Dg Chest Port 1 View  06/21/2014   CLINICAL DATA:  Hypertension.  Short of breath today.  EXAM: PORTABLE CHEST - 1 VIEW  COMPARISON:  06/20/2014  FINDINGS: Mild enlargement of the cardiopericardial silhouette, stable. No mediastinal or hilar masses. Mild basilar opacity likely due to atelectasis. Possible small effusions. No overt pulmonary edema or convincing pneumonia. No pneumothorax.  Bony thorax is demineralized but grossly intact.  IMPRESSION: Mild lung base opacity likely atelectasis. Probable associated small effusions. No overt pulmonary edema or convincing pneumonia. Findings are similar to the previous day's study.   Electronically Signed   By: Lajean Manes M.D.   On: 06/21/2014 16:39   Dg Chest Port 1  View  06/20/2014   CLINICAL DATA:  Lower left chest pain.  Hypertension  EXAM: PORTABLE CHEST - 1 VIEW  COMPARISON:  June 17, 2014  FINDINGS: There is atelectatic change in the left base. There is underlying emphysema. Lungs elsewhere clear. Heart is mildly enlarged with pulmonary vascularity within normal limits. No adenopathy. No pneumothorax. There is atherosclerotic change in the aorta. Bones are osteoporotic.  IMPRESSION: Left base atelectasis.  Underlying emphysema.  Cardiomegaly.   Electronically Signed   By: Lowella Grip III M.D.   On: 06/20/2014 10:11   Dg C-arm 61-120 Min-no Report  06/19/2014   CLINICAL DATA: right anterior hip   C-ARM 61-120 MINUTES  Fluoroscopy was utilized by the requesting physician.  No radiographic  interpretation.    Dg Hip Unilat With Pelvis 2-3 Views Right  06/17/2014   CLINICAL DATA:  Fall this morning with right hip pain and lower extremity foreshortening. Initial encounter.  EXAM: RIGHT HIP (WITH PELVIS) 2-3 VIEWS  COMPARISON:  None.  FINDINGS: There is evidence of an acute, complete subcapital fracture of the proximal femur with mild displacement. No dislocation. No bony lesions are seen. The bony pelvis is intact.  IMPRESSION: Acute subcapital fracture of the right hip.   Electronically Signed   By: Aletta Edouard M.D.   On: 06/17/2014 08:07   Dg Femur, Min 2 Views Right  06/17/2014   CLINICAL DATA:  Recent fall with known hip fracture  EXAM: RIGHT FEMUR 2 VIEWS  COMPARISON:  None.  FINDINGS: Subcapital femoral neck fracture is noted with some impaction at the fracture site. Degenerative changes about the knee joint are seen. Diffuse vascular calcifications are noted. No other fractures are seen.  IMPRESSION: Subcapital right femoral neck fracture. No other acute abnormality is seen.   Electronically Signed   By: Inez Catalina M.D.   On: 06/17/2014 12:09  US Abdomen Limited Ruq  06/18/2014   CLINICAL DATA:  Abnormal elevated liver enzymes, elevated  creatinine and  EXAM: US ABDOMEN LIMITED - RIGHT UPPER QUADRANT  COMPARISON:  None.  FINDINGS: Gallbladder:  Distended to at least 7-8 mm. There is a 2.6 cm stone. Wall thickness is within normal limits at 2 mm and there is no Murphy's sign.  Common bile duct:  Diameter: Measures between 3-6 mm  Liver:  1 cm cyst in the left lobe. No biliary dilatation. No focal abnormalities.  IMPRESSION: Study limited by patient in mobility related to hip fracture. Gallbladder is difficult to completely image. There is a gallstone with no Murphy's sign.   Electronically Signed   By: Skipper Cliche M.D.   On: 06/18/2014 13:29     Microbiology: Recent Results (from the past 240 hour(s))  MRSA PCR Screening     Status: None   Collection Time: 06/17/14  1:00 PM  Result Value Ref Range Status   MRSA by PCR NEGATIVE NEGATIVE Final    Comment:        The GeneXpert MRSA Assay (FDA approved for NASAL specimens only), is one component of a comprehensive MRSA colonization surveillance program. It is not intended to diagnose MRSA infection nor to guide or monitor treatment for MRSA infections.   Clostridium Difficile by PCR     Status: None   Collection Time: 06/21/14  2:55 PM  Result Value Ref Range Status   C difficile by pcr NEGATIVE NEGATIVE Final  Urine culture     Status: None   Collection Time: 06/21/14  6:39 PM  Result Value Ref Range Status   Specimen Description URINE, CATHETERIZED  Final   Special Requests NONE  Final   Colony Count   Final    >=100,000 COLONIES/ML Performed at [facility]    Culture   Final    PSEUDOMONAS AERUGINOSA Performed at [facility]    Report Status 06/24/2014 FINAL  Final   Organism ID, Bacteria PSEUDOMONAS AERUGINOSA  Final      Susceptibility   Pseudomonas aeruginosa - MIC*    CEFEPIME <=1 SENSITIVE Sensitive     CEFTAZIDIME 4 SENSITIVE Sensitive     CIPROFLOXACIN 0.5 SENSITIVE Sensitive     GENTAMICIN 2 SENSITIVE Sensitive     IMIPENEM 1  SENSITIVE Sensitive     PIP/TAZO 8 SENSITIVE Sensitive     TOBRAMYCIN <=1 SENSITIVE Sensitive     * PSEUDOMONAS AERUGINOSA  Culture, blood (routine x 2)     Status: None (Preliminary result)   Collection Time: 06/21/14  7:05 PM  Result Value Ref Range Status   Specimen Description BLOOD LEFT HAND  Final   Special Requests BOTTLES DRAWN AEROBIC ONLY 1CC  Final   Culture   Final           BLOOD CULTURE RECEIVED NO GROWTH TO DATE CULTURE WILL BE HELD FOR 5 DAYS BEFORE ISSUING A FINAL NEGATIVE REPORT Note: Culture results may be compromised due to an inadequate volume of blood received in culture bottles. Performed at [facility]    Report Status PENDING  Incomplete  Culture, blood (routine x 2)     Status: None (Preliminary result)   Collection Time: 06/21/14  7:10 PM  Result Value Ref Range Status   Specimen Description BLOOD RIGHT ARM  Final   Special Requests BOTTLES DRAWN AEROBIC ONLY [facility]  Final   Culture   Final  BLOOD CULTURE RECEIVED NO GROWTH TO DATE CULTURE WILL BE HELD FOR 5 DAYS BEFORE ISSUING A FINAL NEGATIVE REPORT Performed at [facility]    Report Status PENDING  Incomplete     Labs: Basic Metabolic Panel:  Recent Labs Lab 06/20/14 0522 06/21/14 0555 06/22/14 0607 06/23/14 0559 06/24/14 0445  NA 142 146* 147* 143 139  K 4.4 3.9 3.5 3.3* 3.2*  CL 113* 115* 116* 109 103  CO2 16* 21 23 25 26   GLUCOSE 113* 197* 114* 97 111*  BUN 52* 41* 36* 30* 22  CREATININE 1.33* 1.05 0.85 0.95 0.93  CALCIUM 8.0* 8.8 8.5 8.3* 8.5  MG  --  1.9  --   --   --    Liver Function Tests:  Recent Labs Lab 06/18/14 0605 06/19/14 0555  AST 115* 50*  ALT 157* 95*  ALKPHOS 58 54  BILITOT 0.8 0.7  PROT 5.5* 5.4*  ALBUMIN 3.2* 3.2*   No results for input(s): LIPASE, AMYLASE in the last 168 hours. No results for input(s): AMMONIA in the last 168 hours. CBC:  Recent Labs Lab 06/19/14 0555 06/20/14 0522 06/21/14 0555 06/22/14 0607  WBC 11.1*  8.9 15.6* 7.5  HGB 11.8* 12.2 11.6* 10.3*  HCT 37.0 37.9 36.4 31.9*  MCV 95.6 95.2 94.1 94.9  PLT 112* 129* 178 125*   Cardiac Enzymes:  Recent Labs Lab 06/18/14 0704 06/18/14 1242 06/18/14 1859  TROPONINI 1.55* 1.45* 0.96*   BNP: Invalid input(s): POCBNP CBG: No results for input(s): GLUCAP in the last 168 hours.  Time coordinating discharge:  Greater than 30 minutes  Signed:  Allen Basista, DO [facility] Pager: [phone] 06/24/2014, 1:18 PM

## 2014-06-22 NOTE — Progress Notes (Signed)
PROGRESS NOTE  Carrie Moran DHR:[id number] DOB: Aug 09, 1917 DOA: 06/17/2014 PCP: Blanchie Serve, MD  Assessment/Plan: HCAP -06/21/14--start cefepime and vancomycin -Follow up blood cultures -3/20--Respiratory status stable on room air presently--breathing better than on 06/21/2014  Atrial fibrillation with RVR -Developed on the morning of 06/20/2014 -Check TSH--3.660 -Restart aspirin 81 mg daily -The patient is not a good candidate for anticoagulation secondary to her mechanical falls--presently resulted in R-femoral neck fracture -CHADS-VASc = 5 -d/c amlodipine -start diltiazem drip-->po cardizem-->d/c per cardiology -06/21/14--metoprolol dose increased to 50 mg bid  Right Femoral Neck Fracture -appreciate ortho -prophylactic lovenox x 30 days -06/19/14--R-hemiarthroplasty -WBAT   Leukocytosis / transaminitis  - May have component of stress demargination--> initially resolved  - Abd US--distended gallbladder with 2.6 m stone, but difficult to assess gallbladder  - HIDA scan--neg  3/15--Chest xray--L-base atelecatasis.. Urinalysis negative for nitrites with small leukocytes and negative bacteria.  06/18/14--CT abdomen--R-side hydronephrosis @ UPJ, mild fluid and air distension on sm+lg bowel, rectal stool mass   Afebrile and hemodynamically stable  Stool for Cdiff  3/19--WBC increased--likely due to HCAP  3/19--CXR--worsening RLL opacity compared to 06/17/14, UA with pyruia  3/19--started empiric vanco and cefepime  3/19--C diff--neg  Upper airway wheeze -aersolized albuterol prn -will not start steroids -06/22/14--improved  LUE edema -venous duplex r/o DVT  Acute on Chronic Stage III chronic kidney disease/Hydronephrosis R-renal  Creatinine 1.2-1.6 at baseline.   Due to dehyddration, although unclear if hydronephrosis has any contribution  R-hydronephrosis present on previous CTs dating back to 08/09/06  Appreciate  urology opinion--no intervention needed presently  D/c diclofenac gel   Constipation / Abdominal distention  Check KUB--constipation  Fleets enema-->large BM, but still with abdominal pain  06/19/14--had another large BM   Essential hypertension, benign  Continue metoprolol and clonidine.   Glaucoma  Continue Xalatan.   Elevated troponin/demand ischemia  Cardiology following.  2-D echo shows EF 65-70 percent with grade 1 diastolic dysfunction. No significant changes from prior 2-D echo, supporting a diagnosis of demand ischemia.   Peripheral vascular disease   Continue Pletal.  Hypernatremia -start D5W    DVT prophylaxis  Lovenox ordered.  Code Status: DNR. Family Communication: UpdatedSon at the bedside 06/21/14 Disposition Plan: SNF 3/21 or 3/22 if stable           Procedures/Studies: Ct Abdomen Pelvis Wo Contrast  06/19/2014   CLINICAL DATA:  Abdominal pain.  Leukocytosis.  EXAM: CT ABDOMEN AND PELVIS WITHOUT CONTRAST  TECHNIQUE: Multidetector CT imaging of the abdomen and pelvis was performed following the standard protocol without IV contrast.  COMPARISON:  06/17/2014 radiographs  FINDINGS: There are unremarkable unenhanced appearances of the liver, spleen, and pancreas. There is a 2.0 cm calculus within the gallbladder lumen. There is no bile duct dilatation. The right kidney is hydronephrotic, with abrupt caliber transition at the ureteropelvic junction. This may represent a chronic UPJ obstruction. No obstructing calculus or mass is evident. The left kidney is normal. There is no evidence of bowel obstruction or extraluminal air. There is mild fluid and air distention of small and large bowel without caliber transition. There is a prominent rectal stool mass which may represent a degree of fecal impaction. There is colonic diverticulosis. There is no evidence of diverticulitis or other focal acute inflammatory process in the abdomen or pelvis.  There is no ascites.  In the lower chest there are small bilateral effusions, right greater than left. Lung bases are clear.  There  is a subcapital right hip fracture. No other acute musculoskeletal abnormalities are evident. Moderately severe degenerative disc and facet changes are present from L2 through the sacrum.  IMPRESSION: 1. Cholelithiasis 2. Right hydronephrosis, perhaps a chronic UPJ obstruction. No obstructing mass or calculus is evident. 3. Diverticulosis 4. The rectum is distended with stool, raising the question of fecal impaction. There is moderate distension of all of the small and large bowel without caliber transition. 5. Small bilateral pleural effusions 6. Subcapital right hip fracture   Electronically Signed   By: Andreas Newport M.D.   On: 06/19/2014 03:11   Dg Chest 1 View  06/17/2014   CLINICAL DATA:  Pain following fall  EXAM: CHEST  1 VIEW  COMPARISON:  October 12, 2013  FINDINGS: There is no edema or consolidation. Heart is slightly enlarged with pulmonary vascularity within normal limits. No adenopathy. There is atherosclerotic change in the aorta. No pneumothorax. No acute fracture. There is extensive arthropathy in each shoulder. There is evidence of old trauma involving the left third rib.  IMPRESSION: No edema or consolidation. No change in cardiac silhouette. No pneumothorax.   Electronically Signed   By: Lowella Grip III M.D.   On: 06/17/2014 08:09   Dg Pelvis 1-2 Views  06/20/2014   CLINICAL DATA:  Postop hip replacement  EXAM: PELVIS - 1-2 VIEW  COMPARISON:  06/19/2014  FINDINGS: Right hip hemiarthroplasty in satisfactory position alignment. No fracture or complication. No change from yesterday  IMPRESSION: Satisfactory right hip hemiarthroplasty.   Electronically Signed   By: Franchot Gallo M.D.   On: 06/20/2014 10:11   Dg Abd 1 View  06/17/2014   CLINICAL DATA:  Abdominal distension. History of renal insufficiency.  EXAM: ABDOMEN - 1 VIEW  COMPARISON:  CT  01/30/2010  FINDINGS: There is a displaced subcapital right femur fracture. Left hip appears intact. Large amount of gas and stool within the abdomen and pelvis. In particular, there is a large amount of gas in the mid abdomen. Limited evaluation for free air on this supine image. Levoscoliosis of the lumbar spine.  IMPRESSION: Large amount of gas and stool throughout the abdomen and pelvis.  Right subcapital femur fracture.   Electronically Signed   By: Markus Daft M.D.   On: 06/17/2014 20:15   Nm Hepatobiliary Liver Func  06/19/2014   CLINICAL DATA:  Cholelithiasis on CT.  Evaluate for cholecystitis.  EXAM: NUCLEAR MEDICINE HEPATOBILIARY IMAGING  TECHNIQUE: Sequential images of the abdomen were obtained out to 60 minutes following intravenous administration of radiopharmaceutical.  RADIOPHARMACEUTICALS:  5.5 [place]ie VF-64P Choletec  COMPARISON:  CT 06/18/2014  FINDINGS: There is prompt clearance radiotracer from the blood pool. Counts are evident within the small bowel by 30 minutes. Counts begin to fill the gallbladder by 18 minutes and continues to fill.  IMPRESSION: Filling of the gallbladder indicates patent cystic duct. No evidence of acute cholecystitis.   Electronically Signed   By: Suzy Bouchard M.D.   On: 06/19/2014 12:03   Pelvis Portable  06/19/2014   CLINICAL DATA:  Status post right hip replacement  EXAM: PORTABLE PELVIS 1-2 VIEWS  COMPARISON:  None.  FINDINGS: Right hip replacement is now seen. No acute bony abnormality is seen. Soft tissue changes are noted consistent with the recent surgery. The pelvic ring is visualized is intact.  IMPRESSION: Status post right hip replacement without acute abnormality.   Electronically Signed   By: Inez Catalina M.D.   On: 06/19/2014 17:36   Dg Chest  Port 1 View  06/21/2014   CLINICAL DATA:  Hypertension.  Short of breath today.  EXAM: PORTABLE CHEST - 1 VIEW  COMPARISON:  06/20/2014  FINDINGS: Mild enlargement of the cardiopericardial silhouette,  stable. No mediastinal or hilar masses. Mild basilar opacity likely due to atelectasis. Possible small effusions. No overt pulmonary edema or convincing pneumonia. No pneumothorax.  Bony thorax is demineralized but grossly intact.  IMPRESSION: Mild lung base opacity likely atelectasis. Probable associated small effusions. No overt pulmonary edema or convincing pneumonia. Findings are similar to the previous day's study.   Electronically Signed   By: Lajean Manes M.D.   On: 06/21/2014 16:39   Dg Chest Port 1 View  06/20/2014   CLINICAL DATA:  Lower left chest pain.  Hypertension  EXAM: PORTABLE CHEST - 1 VIEW  COMPARISON:  June 17, 2014  FINDINGS: There is atelectatic change in the left base. There is underlying emphysema. Lungs elsewhere clear. Heart is mildly enlarged with pulmonary vascularity within normal limits. No adenopathy. No pneumothorax. There is atherosclerotic change in the aorta. Bones are osteoporotic.  IMPRESSION: Left base atelectasis.  Underlying emphysema.  Cardiomegaly.   Electronically Signed   By: Lowella Grip III M.D.   On: 06/20/2014 10:11   Dg C-arm 61-120 Min-no Report  06/19/2014   CLINICAL DATA: right anterior hip   C-ARM 61-120 MINUTES  Fluoroscopy was utilized by the requesting physician.  No radiographic  interpretation.    Dg Hip Unilat With Pelvis 2-3 Views Right  06/17/2014   CLINICAL DATA:  Fall this morning with right hip pain and lower extremity foreshortening. Initial encounter.  EXAM: RIGHT HIP (WITH PELVIS) 2-3 VIEWS  COMPARISON:  None.  FINDINGS: There is evidence of an acute, complete subcapital fracture of the proximal femur with mild displacement. No dislocation. No bony lesions are seen. The bony pelvis is intact.  IMPRESSION: Acute subcapital fracture of the right hip.   Electronically Signed   By: Aletta Edouard M.D.   On: 06/17/2014 08:07   Dg Femur, Min 2 Views Right  06/17/2014   CLINICAL DATA:  Recent fall with known hip fracture  EXAM: RIGHT  FEMUR 2 VIEWS  COMPARISON:  None.  FINDINGS: Subcapital femoral neck fracture is noted with some impaction at the fracture site. Degenerative changes about the knee joint are seen. Diffuse vascular calcifications are noted. No other fractures are seen.  IMPRESSION: Subcapital right femoral neck fracture. No other acute abnormality is seen.   Electronically Signed   By: Inez Catalina M.D.   On: 06/17/2014 12:09   US Abdomen Limited Ruq  06/18/2014   CLINICAL DATA:  Abnormal elevated liver enzymes, elevated creatinine and  EXAM: US ABDOMEN LIMITED - RIGHT UPPER QUADRANT  COMPARISON:  None.  FINDINGS: Gallbladder:  Distended to at least 7-8 mm. There is a 2.6 cm stone. Wall thickness is within normal limits at 2 mm and there is no Murphy's sign.  Common bile duct:  Diameter: Measures between 3-6 mm  Liver:  1 cm cyst in the left lobe. No biliary dilatation. No focal abnormalities.  IMPRESSION: Study limited by patient in mobility related to hip fracture. Gallbladder is difficult to completely image. There is a gallstone with no Murphy's sign.   Electronically Signed   By: Skipper Cliche M.D.   On: 06/18/2014 13:29         Subjective: Patient is breathing better today. She denies any fevers, chills, chest pain, shortness breath, cough, hemoptysis, nausea, vomiting, diarrhea, abdominal  pain.  Objective: Filed Vitals:   06/22/14 0731 06/22/14 1003 06/22/14 1106 06/22/14 1211  BP:  184/91  158/85  Pulse:  81  65  Temp:    97.4 F (36.3 C)  TempSrc:    Oral  Resp: 16  97   Height:      Weight:      SpO2: 97% 97%  97%    Intake/Output Summary (Last 24 hours) at 06/22/14 1248 Last data filed at 06/22/14 0450  Gross per 24 hour  Intake     50 ml  Output   1425 ml  Net  -1375 ml   Weight change:  Exam:   General:  Pt is alert, follows commands appropriately, not in acute distress  HEENT: No icterus, No thrush, No neck mass, [place]/AT  Cardiovascular: RRR, S1/S2, no rubs, no  gallops  Respiratory: Bibasilar crackles, right greater than left.  Abdomen: Soft/+BS, non tender, non distended, no guarding  Extremities: L>R UE edema, No lymphangitis, No petechiae, No rashes, no synovitis  Data Reviewed: Basic Metabolic Panel:  Recent Labs Lab 06/18/14 0605 06/19/14 0555 06/20/14 0522 06/21/14 0555 06/22/14 0607  NA 142 138 142 146* 147*  K 4.3 4.2 4.4 3.9 3.5  CL 108 109 113* 115* 116*  CO2 19 17* 16* 21 23  GLUCOSE 120* 98 113* 197* 114*  BUN 48* 65* 52* 41* 36*  CREATININE 1.86* 1.79* 1.33* 1.05 0.85  CALCIUM 8.4 7.9* 8.0* 8.8 8.5  MG  --   --   --  1.9  --    Liver Function Tests:  Recent Labs Lab 06/17/14 0750 06/18/14 0605 06/19/14 0555  AST 362* 115* 50*  ALT 244* 157* 95*  ALKPHOS 78 58 54  BILITOT 1.4* 0.8 0.7  PROT 6.2 5.5* 5.4*  ALBUMIN 3.6 3.2* 3.2*   No results for input(s): LIPASE, AMYLASE in the last 168 hours. No results for input(s): AMMONIA in the last 168 hours. CBC:  Recent Labs Lab 06/17/14 0750 06/19/14 0555 06/20/14 0522 06/21/14 0555 06/22/14 0607  WBC 22.2* 11.1* 8.9 15.6* 7.5  NEUTROABS 17.8*  --   --   --   --   HGB 13.9 11.8* 12.2 11.6* 10.3*  HCT 45.1 37.0 37.9 36.4 31.9*  MCV 97.2 95.6 95.2 94.1 94.9  PLT 160 112* 129* 178 125*   Cardiac Enzymes:  Recent Labs Lab 06/18/14 0704 06/18/14 1242 06/18/14 1859  TROPONINI 1.55* 1.45* 0.96*   BNP: Invalid input(s): POCBNP CBG: No results for input(s): GLUCAP in the last 168 hours.  Recent Results (from the past 240 hour(s))  MRSA PCR Screening     Status: None   Collection Time: 06/17/14  1:00 PM  Result Value Ref Range Status   MRSA by PCR NEGATIVE NEGATIVE Final    Comment:        The GeneXpert MRSA Assay (FDA approved for NASAL specimens only), is one component of a comprehensive MRSA colonization surveillance program. It is not intended to diagnose MRSA infection nor to guide or monitor treatment for MRSA infections.   Clostridium  Difficile by PCR     Status: None   Collection Time: 06/21/14  2:55 PM  Result Value Ref Range Status   C difficile by pcr NEGATIVE NEGATIVE Final     Scheduled Meds: . aspirin  81 mg Oral Daily  . ceFEPime (MAXIPIME) IV  1 g Intravenous Q24H  . cilostazol  50 mg Oral BID  . cloNIDine  0.1 mg Oral BID  .  dextrose  500 mL Intravenous Once  . enoxaparin (LOVENOX) injection  30 mg Subcutaneous Q24H  . gabapentin  100 mg Oral QHS  . latanoprost  1 drop Both Eyes Daily  . metoprolol tartrate  75 mg Oral BID  . mirabegron ER  25 mg Oral Daily  . pantoprazole  40 mg Oral Daily  . polyethylene glycol  17 g Oral Daily  . timolol  1 drop Both Eyes Daily  . topiramate  25 mg Oral BID  . vancomycin  500 mg Intravenous Q24H   Continuous Infusions:    Jenniger Figiel, DO  [facility] Pager [phone]  If 7PM-7AM, please contact night-coverage [email or web address] Password [email or web address] 06/22/2014, 12:48 PM   LOS: 5 days

## 2014-06-22 NOTE — Progress Notes (Signed)
MD aware of patients BP increase

## 2014-06-22 NOTE — Progress Notes (Signed)
*  Preliminary Results* Left upper extremity venous duplex completed. Left upper extremity is negative for deep and superficial vein thrombosis.  06/22/2014 3:48 PM  Maudry Mayhew, RVT, RDCS, RDMS

## 2014-06-22 NOTE — Progress Notes (Signed)
I was called by RN about HR going into 120s with SBP in to 200s.  Order was given for labetolol 10mg  IV x 1.  EKG ordered  I came to evaluate pt.  Presently not in any distress.  Denies any cp or sob.  I reviewed telemetry and EKG.    BP and HR improved after labetolol.  Although EKG was auto-interpreted to be Afib--I do not feel this is Afib.  Appears to be some SVT--?atrial tachycardia.  Review of telemetry shows pt having short runs of SVT, not sustained.    Pt presently back in sinus with HR 70s, oxygen sat 98%  On 2L.  Will not make any other adjustments for now as metoprolol just increased to 75mg  bid this am. Although pt has "wheezing", this is all upper airway.  Will not start any steroids at this time as pt is not in any distress and not clear if steroids will help with upper air way wheeze.    DTat

## 2014-06-22 NOTE — Progress Notes (Signed)
Patient's BP became elevated to 202/100 manually checked, HR going up to 120's, MD notified, ordered to given IV labetalol 10 mg and breathing tx, and EKG. Patent now back in Afib rate controlled, BP 142/73, HR 70. MD notified.

## 2014-06-22 NOTE — Progress Notes (Signed)
Subjective: 3 Days Post-Op Procedure(s) (LRB): RIGHT HIP HEMI ANTERIOR APPROACH (Right) Patient reports pain as well controlled. Resting comfortably in bed. Tolerating PO's. Denies CP, SOB, or calf pain.    Objective: Vital signs in last 24 hours: Temp:  [97.5 F (36.4 C)-98 F (36.7 C)] 97.5 F (36.4 C) (03/20 0444) Pulse Rate:  [68-93] 68 (03/20 0444) Resp:  [16-20] 16 (03/20 0731) BP: (125-172)/(64-112) 125/64 mmHg (03/20 0444) SpO2:  [91 %-100 %] 97 % (03/20 0731)  Intake/Output from previous day: 03/19 0701 - 03/20 0700 In: 50 [IV Piggyback:50] Out: 2225 [Urine:2225] Intake/Output this shift:     Recent Labs  06/20/14 0522 06/21/14 0555 06/22/14 0607  HGB 12.2 11.6* 10.3*    Recent Labs  06/21/14 0555 06/22/14 0607  WBC 15.6* 7.5  RBC 3.87 3.36*  HCT 36.4 31.9*  PLT 178 125*    Recent Labs  06/21/14 0555 06/22/14 0607  NA 146* 147*  K 3.9 3.5  CL 115* 116*  CO2 21 23  BUN 41* 36*  CREATININE 1.05 0.85  GLUCOSE 197* 114*  CALCIUM 8.8 8.5   No results for input(s): LABPT, INR in the last 72 hours.  Well nourished. Alert and oriented x3.  Lungs clear, BS x4. Abdomen soft and non tender. Right Calf soft and non tender. Right hip dressing C/D/I. No DVT signs. Compartment soft. No signs of infection.  Right LE neurovascular intact.  Assessment/Plan: 3 Days Post-Op Procedure(s) (LRB): RIGHT HIP HEMI ANTERIOR APPROACH (Right) Up with PT WBAT RLE D/c when ready Medicine and cardiology following.  Chrislyn Seedorf L 06/22/2014, 8:01 AM

## 2014-06-23 LAB — BASIC METABOLIC PANEL
Anion gap: 9 (ref 5–15)
BUN: 30 mg/dL — ABNORMAL HIGH (ref 6–23)
CALCIUM: 8.3 mg/dL — AB (ref 8.4–10.5)
CO2: 25 mmol/L (ref 19–32)
CREATININE: 0.95 mg/dL (ref 0.50–1.10)
Chloride: 109 mmol/L (ref 96–112)
GFR, EST AFRICAN AMERICAN: 58 mL/min — AB (ref >90)
GFR, EST NON AFRICAN AMERICAN: 50 mL/min — AB (ref >90)
Glucose, Bld: 97 mg/dL (ref 70–99)
Potassium: 3.3 mmol/L — ABNORMAL LOW (ref 3.5–5.1)
Sodium: 143 mmol/L (ref 135–145)

## 2014-06-23 MED ORDER — LABETALOL HCL 5 MG/ML IV SOLN
10.0000 mg | Freq: Once | INTRAVENOUS | Status: AC
  Administered 2014-06-23: 10 mg via INTRAVENOUS
  Filled 2014-06-23: qty 4

## 2014-06-23 MED ORDER — AMLODIPINE BESYLATE 5 MG PO TABS
5.0000 mg | ORAL_TABLET | Freq: Every day | ORAL | Status: DC
Start: 2014-06-23 — End: 2014-06-24
  Administered 2014-06-23: 5 mg via ORAL
  Filled 2014-06-23: qty 1

## 2014-06-23 MED ORDER — POTASSIUM CHLORIDE CRYS ER 20 MEQ PO TBCR
40.0000 meq | EXTENDED_RELEASE_TABLET | Freq: Once | ORAL | Status: AC
  Administered 2014-06-23: 40 meq via ORAL
  Filled 2014-06-23: qty 2

## 2014-06-23 NOTE — Discharge Instructions (Signed)
Dr. Rod Can Joint Replacement Specialist [facility] [address], [place], [place] [address] [phone]   TOTAL HIP REPLACEMENT POSTOPERATIVE DIRECTIONS    Hip Rehabilitation, Guidelines Following Surgery  The results of a hip operation are greatly improved after range of motion and muscle strengthening exercises. Follow all safety measures which are given to protect your hip. If any of these exercises cause increased pain or swelling in your joint, decrease the amount until you are comfortable again. Then slowly increase the exercises. Call your caregiver if you have problems or questions.  HOME CARE INSTRUCTIONS  Most of the following instructions are designed to prevent the dislocation of your new hip.  Remove items at home which could result in a fall. This includes throw rugs or furniture in walking pathways.  Continue medications as instructed at time of discharge.  You may have some home medications which will be placed on hold until you complete the course of blood thinner medication. Change the dressing daily and reapply a dry dressing each time.  You may start showering once you are discharged home. Do not put on socks or shoes without following the instructions of your caregivers.   Sit on chairs with arms. Use the chair arms to help push yourself up when arising.  Arrange for the use of a toilet seat elevator so you are not sitting low.   Walk with walker as instructed.  You may resume a sexual relationship in one month or when given the OK by your caregiver.  Use walker as long as suggested by your caregivers.  You may put full weight on your legs and walk as much as is comfortable. Avoid periods of inactivity such as sitting longer than an hour when not asleep. This helps prevent blood clots.  You may return to work once you are cleared by Engineer, production.  Do not drive a car for 6 weeks or until released by your surgeon.  Do not drive  while taking narcotics.  Wear elastic stockings for two weeks following surgery during the day but you may remove then at night.  Make sure you keep all of your appointments after your operation with all of your doctors and caregivers. You should call the office at the above phone number and make an appointment for approximately two weeks after the date of your surgery. Please pick up a stool softener and laxative for home use as long as you are requiring pain medications.  ICE to the affected hip every three hours for 30 minutes at a time and then as needed for pain and swelling. Continue to use ice on the hip for pain and swelling from surgery. You may notice swelling that will progress down to the foot and ankle.  This is normal after surgery.  Elevate the leg when you are not up walking on it.   It is important for you to complete the blood thinner medication as prescribed by your doctor.  Continue to use the breathing machine which will help keep your temperature down.  It is common for your temperature to cycle up and down following surgery, especially at night when you are not up moving around and exerting yourself.  The breathing machine keeps your lungs expanded and your temperature down.  RANGE OF MOTION AND STRENGTHENING EXERCISES  These exercises are designed to help you keep full movement of your hip joint. Follow your caregiver's or physical therapist's instructions. Perform all exercises about fifteen times, three times per  day or as directed. Exercise both hips, even if you have had only one joint replacement. These exercises can be done on a training (exercise) mat, on the floor, on a table or on a bed. Use whatever works the best and is most comfortable for you. Use music or television while you are exercising so that the exercises are a pleasant break in your day. This will make your life better with the exercises acting as a break in routine you can look forward to.  Lying on your  back, slowly slide your foot toward your buttocks, raising your knee up off the floor. Then slowly slide your foot back down until your leg is straight again.  Lying on your back spread your legs as far apart as you can without causing discomfort.  Lying on your side, raise your upper leg and foot straight up from the floor as far as is comfortable. Slowly lower the leg and repeat.  Lying on your back, tighten up the muscle in the front of your thigh (quadriceps muscles). You can do this by keeping your leg straight and trying to raise your heel off the floor. This helps strengthen the largest muscle supporting your knee.  Lying on your back, tighten up the muscles of your buttocks both with the legs straight and with the knee bent at a comfortable angle while keeping your heel on the floor.   SKILLED REHAB INSTRUCTIONS: If the patient is transferred to a skilled rehab facility following release from the hospital, a list of the current medications will be sent to the facility for the patient to continue.  When discharged from the skilled rehab facility, please have the facility set up the patient's [place] prior to being released. Also, the skilled facility will be responsible for providing the patient with their medications at time of release from the facility to include their pain medication and their blood thinner medication. If the patient is still at the rehab facility at time of the two week follow up appointment, the skilled rehab facility will also need to assist the patient in arranging follow up appointment in our office and any transportation needs.  MAKE SURE YOU:  Understand these instructions.  Will watch your condition.  Will get help right away if you are not doing well or get worse.  Pick up stool softner and laxative for home use following surgery while on pain medications. Do not submerge incision under water. Please use good hand washing techniques while  changing dressing each day. May shower starting three days after surgery. Please use a clean towel to pat the incision dry following showers. Continue to use ice for pain and swelling after surgery. Do not use any lotions or creams on the incision until instructed by your surgeon. Total Hip Protocol.

## 2014-06-23 NOTE — Progress Notes (Signed)
  06/23/2014 4 Days Post-Op Procedure(s) (LRB): RIGHT HIP HEMI ANTERIOR APPROACH (Right) Patient reports pain as mild.   Patient seen in rounds for Dr. Lyla Glassing. Patient is well, but has had some minor complaints of pain in the right hip, requiring pain medications They will be WBAT to the right. Plan is to go Skilled nursing facility after hospital stay.  Vital signs in last 24 hours: Temp:  [97.4 F (36.3 C)-98.6 F (37 C)] 98.6 F (37 C) (03/21 0411) Pulse Rate:  [60-91] 60 (03/21 0411) Resp:  [19-97] 20 (03/21 0411) BP: (142-211)/(73-112) 157/86 mmHg (03/21 0411) SpO2:  [96 %-100 %] 100 % (03/21 0411)  I&O's: I/O last 3 completed shifts: In: 970 [P.O.:420; IV Piggyback:550] Out: 4128 [Urine:3050]    Labs:  Recent Labs  06/21/14 0555 06/22/14 0607  HGB 11.6* 10.3*    Recent Labs  06/21/14 0555 06/22/14 0607  WBC 15.6* 7.5  RBC 3.87 3.36*  HCT 36.4 31.9*  PLT 178 125*    Recent Labs  06/22/14 0607 06/23/14 0559  NA 147* 143  K 3.5 3.3*  CL 116* 109  CO2 23 25  BUN 36* 30*  CREATININE 0.85 0.95  GLUCOSE 114* 97  CALCIUM 8.5 8.3*   No results for input(s): LABPT, INR in the last 72 hours.   EXAM: General - Patient is Alert and Appropriate Extremity - Neurovascular intact Sensation intact distally Dressing - dressing C/D/I, Aquacel dressing had peeled off and would not restick.   Removed and applied a dry gauze dressing. Motor Function - intact, moving foot and toes well on exam.   Past Medical History  Diagnosis Date  . Hypertension   . Memory loss   . GERD (gastroesophageal reflux disease)   . RLS (restless legs syndrome)   . Hypertonicity of bladder   . Renal insufficiency   . Other headache syndromes(339.89)   . Osteoporosis, unspecified   . Urinary frequency   . Accelerated hypertension 07/09/2013  . Stage III chronic kidney disease 06/17/2014  . Hip fracture requiring operative repair 06/17/2014  . Primary osteoarthritis involving  multiple joints 09/24/2013  . PVD (peripheral vascular disease) 11/07/2012  . Glaucoma 11/07/2012  . Depression 11/07/2012  . Overactive bladder 10/17/2012    Assessment/Plan: 4 Days Post-Op Procedure(s) (LRB): RIGHT HIP HEMI ANTERIOR APPROACH (Right) Principal Problem:   Hip fracture, right Active Problems:   Essential hypertension, benign   Glaucoma   Elevated troponin   Preoperative cardiovascular examination   Hip fracture requiring operative repair   Stage III chronic kidney disease   Demand ischemia   Acute renal failure superimposed on stage 3 chronic kidney disease   Femoral neck fracture   Transaminitis   Atrial fibrillation with RVR   HCAP (healthcare-associated pneumonia)  Estimated body mass index is 20.05 kg/(m^2) as calculated from the following:   Height as of this encounter: 5\' 1"  (1.549 m).   Weight as of this encounter: 48.1 kg (106 lb 0.7 oz). Up with therapy Discharge to SNF when medically stable.  DVT Prophylaxis - Aspirin and Lovenox Weight-Bearing as tolerated to right leg  Arlee Muslim, PA-C Orthopaedic Surgery 06/23/2014, 7:39 AM

## 2014-06-23 NOTE — Progress Notes (Signed)
PROGRESS NOTE  Carrie Moran HEN:[id number] DOB: Sep 03, 1917 DOA: 06/17/2014 PCP: Blanchie Serve, MD  Assessment/Plan: HCAP -06/21/14--patient developed increasing shortness of breath, wheezing, WBC up to 15.6 -06/21/2014 CXR--increasing right lower lobe opacity when compared to 06/17/14 -06/21/14--start cefepime and vancomycin -Follow up blood cultures--neg to date  -3/20--Respiratory status stable on room air presently--breathing better than on 06/21/2014 -Bronchodilators when necessary wheezing  Atrial fibrillation with RVR -Developed on the morning of 06/20/2014 -Check TSH--3.660 -Restart aspirin 81 mg daily -The patient is not a good candidate for anticoagulation secondary to her mechanical falls--presently resulted in R-femoral neck fracture -CHADS-VASc = 5 -d/c amlodipine -start diltiazem drip-->po cardizem-->d/c per cardiology -06/21/14--metoprolol dose increased to 50 mg bid-->75mg  bid on 3/20  Right Femoral Neck Fracture -appreciate ortho -prophylactic lovenox x 30 days -06/19/14--R-hemiarthroplasty -WBAT   Leukocytosis / transaminitis  - May have component of stress demargination--> initially resolved  - Abd US--distended gallbladder with 2.6 m stone, but difficult to assess gallbladder  - HIDA scan--neg  3/15--Chest xray--L-base atelecatasis.. Urinalysis negative for nitrites with small leukocytes and negative bacteria.  06/18/14--CT abdomen--R-side hydronephrosis @ UPJ, mild fluid and air distension on sm+lg bowel, rectal stool mass   Afebrile and hemodynamically stable  Stool for Cdiff--neg  3/19--WBC increased--likely due to HCAP  3/19--CXR--worsening RLL opacity compared to 06/17/14, UA with pyruia  3/19--started empiric vanco and cefepime  06/23/14--d/c vanco, continue cefepime for uti pending final susceptibility  Upper airway wheeze -aersolized albuterol prn -will not start steroids -06/22/14--improved  LUE  edema -venous duplex r/o DVT--neg for superficial or DVT  Acute on Chronic Stage III chronic kidney disease/Hydronephrosis R-renal  Creatinine 1.2-1.6 at baseline.   Due to dehyddration, although unclear if hydronephrosis has any contribution  R-hydronephrosis present on previous CTs dating back to 08/09/06  Appreciate urology opinion--no intervention needed presently  D/c diclofenac gel  Renal function improved back to baseline with hydration   Constipation / Abdominal distention  Check KUB--constipation  Fleets enema-->large BM, but still with abdominal pain  06/19/14--had another large BM   Essential hypertension, benign  Continue metoprolol and clonidine.  Add amlodipine   Glaucoma  Continue Xalatan.   Elevated troponin/demand ischemia  Cardiology following.  2-D echo shows EF 65-70 percent with grade 1 diastolic dysfunction. No significant changes from prior 2-D echo, supporting a diagnosis of demand ischemia.   Peripheral vascular disease   Continue Pletal.  Hypernatremia -start D5W-->improved    DVT prophylaxis  Lovenox ordered.  Code Status: DNR.   Family Communication:   Daughter in law updated at beside Disposition Plan:   SNF 3/22 if stable       Procedures/Studies: Ct Abdomen Pelvis Wo Contrast  06/19/2014   CLINICAL DATA:  Abdominal pain.  Leukocytosis.  EXAM: CT ABDOMEN AND PELVIS WITHOUT CONTRAST  TECHNIQUE: Multidetector CT imaging of the abdomen and pelvis was performed following the standard protocol without IV contrast.  COMPARISON:  06/17/2014 radiographs  FINDINGS: There are unremarkable unenhanced appearances of the liver, spleen, and pancreas. There is a 2.0 cm calculus within the gallbladder lumen. There is no bile duct dilatation. The right kidney is hydronephrotic, with abrupt caliber transition at the ureteropelvic junction. This may represent a chronic UPJ obstruction. No obstructing calculus or mass is evident.  The left kidney is normal. There is no evidence of bowel obstruction or extraluminal air. There is mild fluid and air distention of small and large bowel without caliber transition. There is a prominent rectal  stool mass which may represent a degree of fecal impaction. There is colonic diverticulosis. There is no evidence of diverticulitis or other focal acute inflammatory process in the abdomen or pelvis. There is no ascites.  In the lower chest there are small bilateral effusions, right greater than left. Lung bases are clear.  There is a subcapital right hip fracture. No other acute musculoskeletal abnormalities are evident. Moderately severe degenerative disc and facet changes are present from L2 through the sacrum.  IMPRESSION: 1. Cholelithiasis 2. Right hydronephrosis, perhaps a chronic UPJ obstruction. No obstructing mass or calculus is evident. 3. Diverticulosis 4. The rectum is distended with stool, raising the question of fecal impaction. There is moderate distension of all of the small and large bowel without caliber transition. 5. Small bilateral pleural effusions 6. Subcapital right hip fracture   Electronically Signed   By: Andreas Newport M.D.   On: 06/19/2014 03:11   Dg Chest 1 View  06/17/2014   CLINICAL DATA:  Pain following fall  EXAM: CHEST  1 VIEW  COMPARISON:  October 12, 2013  FINDINGS: There is no edema or consolidation. Heart is slightly enlarged with pulmonary vascularity within normal limits. No adenopathy. There is atherosclerotic change in the aorta. No pneumothorax. No acute fracture. There is extensive arthropathy in each shoulder. There is evidence of old trauma involving the left third rib.  IMPRESSION: No edema or consolidation. No change in cardiac silhouette. No pneumothorax.   Electronically Signed   By: Lowella Grip III M.D.   On: 06/17/2014 08:09   Dg Pelvis 1-2 Views  06/20/2014   CLINICAL DATA:  Postop hip replacement  EXAM: PELVIS - 1-2 VIEW  COMPARISON:   06/19/2014  FINDINGS: Right hip hemiarthroplasty in satisfactory position alignment. No fracture or complication. No change from yesterday  IMPRESSION: Satisfactory right hip hemiarthroplasty.   Electronically Signed   By: Franchot Gallo M.D.   On: 06/20/2014 10:11   Dg Abd 1 View  06/17/2014   CLINICAL DATA:  Abdominal distension. History of renal insufficiency.  EXAM: ABDOMEN - 1 VIEW  COMPARISON:  CT 01/30/2010  FINDINGS: There is a displaced subcapital right femur fracture. Left hip appears intact. Large amount of gas and stool within the abdomen and pelvis. In particular, there is a large amount of gas in the mid abdomen. Limited evaluation for free air on this supine image. Levoscoliosis of the lumbar spine.  IMPRESSION: Large amount of gas and stool throughout the abdomen and pelvis.  Right subcapital femur fracture.   Electronically Signed   By: Markus Daft M.D.   On: 06/17/2014 20:15   Nm Hepatobiliary Liver Func  06/19/2014   CLINICAL DATA:  Cholelithiasis on CT.  Evaluate for cholecystitis.  EXAM: NUCLEAR MEDICINE HEPATOBILIARY IMAGING  TECHNIQUE: Sequential images of the abdomen were obtained out to 60 minutes following intravenous administration of radiopharmaceutical.  RADIOPHARMACEUTICALS:  5.5 [place] [id number] Choletec  COMPARISON:  CT 06/18/2014  FINDINGS: There is prompt clearance radiotracer from the blood pool. Counts are evident within the small bowel by 30 minutes. Counts begin to fill the gallbladder by 18 minutes and continues to fill.  IMPRESSION: Filling of the gallbladder indicates patent cystic duct. No evidence of acute cholecystitis.   Electronically Signed   By: Suzy Bouchard M.D.   On: 06/19/2014 12:03   Pelvis Portable  06/19/2014   CLINICAL DATA:  Status post right hip replacement  EXAM: PORTABLE PELVIS 1-2 VIEWS  COMPARISON:  None.  FINDINGS: Right hip replacement is  now seen. No acute bony abnormality is seen. Soft tissue changes are noted consistent with the recent  surgery. The pelvic ring is visualized is intact.  IMPRESSION: Status post right hip replacement without acute abnormality.   Electronically Signed   By: Inez Catalina M.D.   On: 06/19/2014 17:36   Dg Chest Port 1 View  06/21/2014   CLINICAL DATA:  Hypertension.  Short of breath today.  EXAM: PORTABLE CHEST - 1 VIEW  COMPARISON:  06/20/2014  FINDINGS: Mild enlargement of the cardiopericardial silhouette, stable. No mediastinal or hilar masses. Mild basilar opacity likely due to atelectasis. Possible small effusions. No overt pulmonary edema or convincing pneumonia. No pneumothorax.  Bony thorax is demineralized but grossly intact.  IMPRESSION: Mild lung base opacity likely atelectasis. Probable associated small effusions. No overt pulmonary edema or convincing pneumonia. Findings are similar to the previous day's study.   Electronically Signed   By: Lajean Manes M.D.   On: 06/21/2014 16:39   Dg Chest Port 1 View  06/20/2014   CLINICAL DATA:  Lower left chest pain.  Hypertension  EXAM: PORTABLE CHEST - 1 VIEW  COMPARISON:  June 17, 2014  FINDINGS: There is atelectatic change in the left base. There is underlying emphysema. Lungs elsewhere clear. Heart is mildly enlarged with pulmonary vascularity within normal limits. No adenopathy. No pneumothorax. There is atherosclerotic change in the aorta. Bones are osteoporotic.  IMPRESSION: Left base atelectasis.  Underlying emphysema.  Cardiomegaly.   Electronically Signed   By: Lowella Grip III M.D.   On: 06/20/2014 10:11   Dg C-arm 61-120 Min-no Report  06/19/2014   CLINICAL DATA: right anterior hip   C-ARM 61-120 MINUTES  Fluoroscopy was utilized by the requesting physician.  No radiographic  interpretation.    Dg Hip Unilat With Pelvis 2-3 Views Right  06/17/2014   CLINICAL DATA:  Fall this morning with right hip pain and lower extremity foreshortening. Initial encounter.  EXAM: RIGHT HIP (WITH PELVIS) 2-3 VIEWS  COMPARISON:  None.  FINDINGS: There is  evidence of an acute, complete subcapital fracture of the proximal femur with mild displacement. No dislocation. No bony lesions are seen. The bony pelvis is intact.  IMPRESSION: Acute subcapital fracture of the right hip.   Electronically Signed   By: Aletta Edouard M.D.   On: 06/17/2014 08:07   Dg Femur, Min 2 Views Right  06/17/2014   CLINICAL DATA:  Recent fall with known hip fracture  EXAM: RIGHT FEMUR 2 VIEWS  COMPARISON:  None.  FINDINGS: Subcapital femoral neck fracture is noted with some impaction at the fracture site. Degenerative changes about the knee joint are seen. Diffuse vascular calcifications are noted. No other fractures are seen.  IMPRESSION: Subcapital right femoral neck fracture. No other acute abnormality is seen.   Electronically Signed   By: Inez Catalina M.D.   On: 06/17/2014 12:09   US Abdomen Limited Ruq  06/18/2014   CLINICAL DATA:  Abnormal elevated liver enzymes, elevated creatinine and  EXAM: US ABDOMEN LIMITED - RIGHT UPPER QUADRANT  COMPARISON:  None.  FINDINGS: Gallbladder:  Distended to at least 7-8 mm. There is a 2.6 cm stone. Wall thickness is within normal limits at 2 mm and there is no Murphy's sign.  Common bile duct:  Diameter: Measures between 3-6 mm  Liver:  1 cm cyst in the left lobe. No biliary dilatation. No focal abnormalities.  IMPRESSION: Study limited by patient in mobility related to hip fracture. Gallbladder is difficult to  completely image. There is a gallstone with no Murphy's sign.   Electronically Signed   By: Skipper Cliche M.D.   On: 06/18/2014 13:29         Subjective: Patient denies fevers, chills, headache, chest pain, dyspnea, nausea, vomiting, diarrhea, abdominal pain, dysuria, hematuria   Objective: Filed Vitals:   06/23/14 1408 06/23/14 1506 06/23/14 1518 06/23/14 1914  BP: 214/93  167/82 167/108  Pulse: 81  93 70  Temp: 97.7 F (36.5 C)  98.2 F (36.8 C) 98 F (36.7 C)  TempSrc: Oral  Oral Oral  Resp: 20 18 19 20     Height:      Weight:      SpO2: 94%  97% 97%    Intake/Output Summary (Last 24 hours) at 06/23/14 1939 Last data filed at 06/23/14 1917  Gross per 24 hour  Intake    720 ml  Output   3250 ml  Net  -2530 ml   Weight change:  Exam:   General:  Pt is alert, follows commands appropriately, not in acute distress  HEENT: No icterus, No thrush,  [place]/AT  Cardiovascular: RRR, S1/S2, no rubs, no gallops  Respiratory: Bibasilar crackles. No wheezing. Good air movement.  Abdomen: Soft/+BS, non tender, non distended, no guarding  Extremities: 1+ UE edema, No lymphangitis, No petechiae, No rashes, no synovitis  Data Reviewed: Basic Metabolic Panel:  Recent Labs Lab 06/19/14 0555 06/20/14 0522 06/21/14 0555 06/22/14 0607 06/23/14 0559  NA 138 142 146* 147* 143  K 4.2 4.4 3.9 3.5 3.3*  CL 109 113* 115* 116* 109  CO2 17* 16* 21 23 25   GLUCOSE 98 113* 197* 114* 97  BUN 65* 52* 41* 36* 30*  CREATININE 1.79* 1.33* 1.05 0.85 0.95  CALCIUM 7.9* 8.0* 8.8 8.5 8.3*  MG  --   --  1.9  --   --    Liver Function Tests:  Recent Labs Lab 06/17/14 0750 06/18/14 0605 06/19/14 0555  AST 362* 115* 50*  ALT 244* 157* 95*  ALKPHOS 78 58 54  BILITOT 1.4* 0.8 0.7  PROT 6.2 5.5* 5.4*  ALBUMIN 3.6 3.2* 3.2*   No results for input(s): LIPASE, AMYLASE in the last 168 hours. No results for input(s): AMMONIA in the last 168 hours. CBC:  Recent Labs Lab 06/17/14 0750 06/19/14 0555 06/20/14 0522 06/21/14 0555 06/22/14 0607  WBC 22.2* 11.1* 8.9 15.6* 7.5  NEUTROABS 17.8*  --   --   --   --   HGB 13.9 11.8* 12.2 11.6* 10.3*  HCT 45.1 37.0 37.9 36.4 31.9*  MCV 97.2 95.6 95.2 94.1 94.9  PLT 160 112* 129* 178 125*   Cardiac Enzymes:  Recent Labs Lab 06/18/14 0704 06/18/14 1242 06/18/14 1859  TROPONINI 1.55* 1.45* 0.96*   BNP: Invalid input(s): POCBNP CBG: No results for input(s): GLUCAP in the last 168 hours.  Recent Results (from the past 240 hour(s))  MRSA PCR Screening      Status: None   Collection Time: 06/17/14  1:00 PM  Result Value Ref Range Status   MRSA by PCR NEGATIVE NEGATIVE Final    Comment:        The GeneXpert MRSA Assay (FDA approved for NASAL specimens only), is one component of a comprehensive MRSA colonization surveillance program. It is not intended to diagnose MRSA infection nor to guide or monitor treatment for MRSA infections.   Clostridium Difficile by PCR     Status: None   Collection Time: 06/21/14  2:55 PM  Result Value Ref Range Status   C difficile by pcr NEGATIVE NEGATIVE Final  Urine culture     Status: None (Preliminary result)   Collection Time: 06/21/14  6:39 PM  Result Value Ref Range Status   Specimen Description URINE, CATHETERIZED  Final   Special Requests NONE  Final   Colony Count   Final    >=100,000 COLONIES/ML Performed at [facility]    Culture   Final    [place] Performed at [facility]    Report Status PENDING  Incomplete  Culture, blood (routine x 2)     Status: None (Preliminary result)   Collection Time: 06/21/14  7:05 PM  Result Value Ref Range Status   Specimen Description BLOOD LEFT HAND  Final   Special Requests BOTTLES DRAWN AEROBIC ONLY 1CC  Final   Culture   Final           BLOOD CULTURE RECEIVED NO GROWTH TO DATE CULTURE WILL BE HELD FOR 5 DAYS BEFORE ISSUING A FINAL NEGATIVE REPORT Note: Culture results may be compromised due to an inadequate volume of blood received in culture bottles. Performed at [facility]    Report Status PENDING  Incomplete  Culture, blood (routine x 2)     Status: None (Preliminary result)   Collection Time: 06/21/14  7:10 PM  Result Value Ref Range Status   Specimen Description BLOOD RIGHT ARM  Final   Special Requests BOTTLES DRAWN AEROBIC ONLY 6CC  Final   Culture   Final           BLOOD CULTURE RECEIVED NO GROWTH TO DATE CULTURE WILL BE HELD FOR 5 DAYS BEFORE ISSUING A FINAL NEGATIVE REPORT Performed at [facility]    Report Status PENDING  Incomplete     Scheduled Meds: . amLODipine  5 mg Oral Daily  . aspirin  81 mg Oral Daily  . ceFEPime (MAXIPIME) IV  1 g Intravenous Q24H  . cilostazol  50 mg Oral BID  . cloNIDine  0.1 mg Oral BID  . enoxaparin (LOVENOX) injection  30 mg Subcutaneous Q24H  . gabapentin  100 mg Oral QHS  . latanoprost  1 drop Both Eyes Daily  . metoprolol tartrate  75 mg Oral BID  . mirabegron ER  25 mg Oral Daily  . pantoprazole  40 mg Oral Daily  . polyethylene glycol  17 g Oral Daily  . potassium chloride  40 mEq Oral Once  . timolol  1 drop Both Eyes Daily  . topiramate  25 mg Oral BID   Continuous Infusions:    Nikeisha Klutz, DO  [facility] Pager [phone]  If 7PM-7AM, please contact night-coverage [email or web address] Password [facility] 06/23/2014, 7:39 PM   LOS: 6 days

## 2014-06-23 NOTE — Progress Notes (Signed)
Patient ID: Carrie Moran, female   DOB: 1917-09-19, 81 y.o.   MRN: [id number]   SUBJECTIVE:  No complaints sitting up eating breakfast   OBJECTIVE:   Vitals:   Filed Vitals:   06/23/14 0000 06/23/14 0400 06/23/14 0411 06/23/14 0737  BP:   157/86   Pulse:   60   Temp:   98.6 F (37 C)   TempSrc:   Axillary   Resp: 19 20 20 18   Height:      Weight:      SpO2: 99% 100% 100% 100%   I&O's:    Intake/Output Summary (Last 24 hours) at 06/23/14 3474 Last data filed at 06/23/14 0417  Gross per 24 hour  Intake    800 ml  Output   2150 ml  Net  -1350 ml   TELEMETRY: Reviewed telemetry pt in NSR with PAC's with several short bursts of atrial tachcyardia     PHYSICAL EXAM General: Frail elderly female  Head: Eyes PERRLA, No xanthomas.   Normal cephalic and atramatic  Lungs:   Clear bilaterally to auscultation and percussion. Heart:  RRR with SEM  Abdomen: Bowel sounds are positive, abdomen soft and non-tender without masses Extremities:   No clubbing, cyanosis poor pulses below knees  Neuro: Alert and oriented X 3. Psych:  Good affect, responds appropriately    Current facility-administered medications:  .  acetaminophen (TYLENOL) tablet 650 mg, 650 mg, Oral, Q6H PRN **OR** acetaminophen (TYLENOL) suppository 650 mg, 650 mg, Rectal, Q6H PRN, Rod Can, MD .  albuterol (PROVENTIL) (2.5 MG/3ML) 0.083% nebulizer solution 2.5 mg, 2.5 mg, Nebulization, Q4H PRN, Orson Eva, MD, 2.5 mg at 06/22/14 1840 .  aspirin chewable tablet 81 mg, 81 mg, Oral, Daily, Orson Eva, MD, 81 mg at 06/22/14 1005 .  ceFEPIme (MAXIPIME) 1 g in dextrose 5 % 50 mL IVPB, 1 g, Intravenous, Q24H, Anh P Pham, RPH, 1 g at 06/22/14 1741 .  cilostazol (PLETAL) tablet 50 mg, 50 mg, Oral, BID, Venetia Maxon Rama, MD, 50 mg at 06/22/14 2206 .  cloNIDine (CATAPRES) tablet 0.1 mg, 0.1 mg, Oral, BID, Venetia Maxon Rama, MD, 0.1 mg at 06/22/14 2205 .  enoxaparin (LOVENOX) injection 30 mg, 30 mg, Subcutaneous, Q24H, Rod Can, MD, 30 mg at 06/23/14 0735 .  gabapentin (NEURONTIN) capsule 100 mg, 100 mg, Oral, QHS, Venetia Maxon Rama, MD, 100 mg at 06/22/14 2206 .  HYDROcodone-acetaminophen (NORCO/VICODIN) 5-325 MG per tablet 1-2 tablet, 1-2 tablet, Oral, Q6H PRN, Orson Eva, MD, 1 tablet at 06/22/14 2206 .  latanoprost (XALATAN) 0.005 % ophthalmic solution 1 drop, 1 drop, Both Eyes, Daily, Venetia Maxon Rama, MD, 1 drop at 06/22/14 1010 .  menthol-cetylpyridinium (CEPACOL) lozenge 3 mg, 1 lozenge, Oral, PRN **OR** phenol (CHLORASEPTIC) mouth spray 1 spray, 1 spray, Mouth/Throat, PRN, Rod Can, MD .  metoCLOPramide (REGLAN) tablet 5-10 mg, 5-10 mg, Oral, Q8H PRN **OR** metoCLOPramide (REGLAN) injection 5-10 mg, 5-10 mg, Intravenous, Q8H PRN, Rod Can, MD .  metoprolol tartrate (LOPRESSOR) tablet 75 mg, 75 mg, Oral, BID, Sueanne Margarita, MD, 75 mg at 06/22/14 2205 .  mirabegron ER (MYRBETRIQ) tablet 25 mg, 25 mg, Oral, Daily, Venetia Maxon Rama, MD, 25 mg at 06/22/14 1004 .  ondansetron (ZOFRAN) tablet 4 mg, 4 mg, Oral, Q6H PRN **OR** ondansetron (ZOFRAN) injection 4 mg, 4 mg, Intravenous, Q6H PRN, Rod Can, MD .  pantoprazole (PROTONIX) EC tablet 40 mg, 40 mg, Oral, Daily, Venetia Maxon Rama, MD, 40 mg at 06/22/14 1005 .  polyethylene glycol (MIRALAX / GLYCOLAX) packet 17 g, 17 g, Oral, Daily, Venetia Maxon Rama, MD, 17 g at 06/22/14 1004 .  senna-docusate (Senokot-S) tablet 2 tablet, 2 tablet, Oral, Daily PRN, Christina P Rama, MD .  timolol (TIMOPTIC) 0.5 % ophthalmic solution 1 drop, 1 drop, Both Eyes, Daily, Venetia Maxon Rama, MD, 1 drop at 06/22/14 1009 .  topiramate (TOPAMAX) tablet 25 mg, 25 mg, Oral, BID, Venetia Maxon Rama, MD, 25 mg at 06/22/14 2205 .  vancomycin (VANCOCIN) 500 mg in sodium chloride 0.9 % 100 mL IVPB, 500 mg, Intravenous, Q24H, Anh P Pham, RPH, 500 mg at 06/22/14 1812  LABS: Basic Metabolic Panel:  Recent Labs  06/21/14 0555 06/22/14 0607 06/23/14 0559  NA 146* 147* 143  K 3.9  3.5 3.3*  CL 115* 116* 109  CO2 21 23 25   GLUCOSE 197* 114* 97  BUN 41* 36* 30*  CREATININE 1.05 0.85 0.95  CALCIUM 8.8 8.5 8.3*  MG 1.9  --   --    Liver Function Tests: No results for input(s): AST, ALT, ALKPHOS, BILITOT, PROT, ALBUMIN in the last 72 hours. No results for input(s): LIPASE, AMYLASE in the last 72 hours. CBC:  Recent Labs  06/21/14 0555 06/22/14 0607  WBC 15.6* 7.5  HGB 11.6* 10.3*  HCT 36.4 31.9*  MCV 94.1 94.9  PLT 178 125*   Thyroid Function Tests:  Recent Labs  06/22/14 0607  TSH 3.660   Anemia Panel: No results for input(s): VITAMINB12, FOLATE, FERRITIN, TIBC, IRON, RETICCTPCT in the last 72 hours. Coag Panel:   Lab Results  Component Value Date   INR 1.37 06/17/2014   INR 1.03 05/02/2009    RADIOLOGY: Ct Abdomen Pelvis Wo Contrast  06/19/2014   CLINICAL DATA:  Abdominal pain.  Leukocytosis.  EXAM: CT ABDOMEN AND PELVIS WITHOUT CONTRAST  TECHNIQUE: Multidetector CT imaging of the abdomen and pelvis was performed following the standard protocol without IV contrast.  COMPARISON:  06/17/2014 radiographs  FINDINGS: There are unremarkable unenhanced appearances of the liver, spleen, and pancreas. There is a 2.0 cm calculus within the gallbladder lumen. There is no bile duct dilatation. The right kidney is hydronephrotic, with abrupt caliber transition at the ureteropelvic junction. This may represent a chronic UPJ obstruction. No obstructing calculus or mass is evident. The left kidney is normal. There is no evidence of bowel obstruction or extraluminal air. There is mild fluid and air distention of small and large bowel without caliber transition. There is a prominent rectal stool mass which may represent a degree of fecal impaction. There is colonic diverticulosis. There is no evidence of diverticulitis or other focal acute inflammatory process in the abdomen or pelvis. There is no ascites.  In the lower chest there are small bilateral effusions, right  greater than left. Lung bases are clear.  There is a subcapital right hip fracture. No other acute musculoskeletal abnormalities are evident. Moderately severe degenerative disc and facet changes are present from L2 through the sacrum.  IMPRESSION: 1. Cholelithiasis 2. Right hydronephrosis, perhaps a chronic UPJ obstruction. No obstructing mass or calculus is evident. 3. Diverticulosis 4. The rectum is distended with stool, raising the question of fecal impaction. There is moderate distension of all of the small and large bowel without caliber transition. 5. Small bilateral pleural effusions 6. Subcapital right hip fracture   Electronically Signed   By: Andreas Newport M.D.   On: 06/19/2014 03:11   Dg Chest 1 View  06/17/2014   CLINICAL DATA:  Pain following fall  EXAM: CHEST  1 VIEW  COMPARISON:  October 12, 2013  FINDINGS: There is no edema or consolidation. Heart is slightly enlarged with pulmonary vascularity within normal limits. No adenopathy. There is atherosclerotic change in the aorta. No pneumothorax. No acute fracture. There is extensive arthropathy in each shoulder. There is evidence of old trauma involving the left third rib.  IMPRESSION: No edema or consolidation. No change in cardiac silhouette. No pneumothorax.   Electronically Signed   By: Lowella Grip III M.D.   On: 06/17/2014 08:09   Dg Pelvis 1-2 Views  06/20/2014   CLINICAL DATA:  Postop hip replacement  EXAM: PELVIS - 1-2 VIEW  COMPARISON:  06/19/2014  FINDINGS: Right hip hemiarthroplasty in satisfactory position alignment. No fracture or complication. No change from yesterday  IMPRESSION: Satisfactory right hip hemiarthroplasty.   Electronically Signed   By: Franchot Gallo M.D.   On: 06/20/2014 10:11   Dg Abd 1 View  06/17/2014   CLINICAL DATA:  Abdominal distension. History of renal insufficiency.  EXAM: ABDOMEN - 1 VIEW  COMPARISON:  CT 01/30/2010  FINDINGS: There is a displaced subcapital right femur fracture. Left hip appears  intact. Large amount of gas and stool within the abdomen and pelvis. In particular, there is a large amount of gas in the mid abdomen. Limited evaluation for free air on this supine image. Levoscoliosis of the lumbar spine.  IMPRESSION: Large amount of gas and stool throughout the abdomen and pelvis.  Right subcapital femur fracture.   Electronically Signed   By: Markus Daft M.D.   On: 06/17/2014 20:15   Nm Hepatobiliary Liver Func  06/19/2014   CLINICAL DATA:  Cholelithiasis on CT.  Evaluate for cholecystitis.  EXAM: NUCLEAR MEDICINE HEPATOBILIARY IMAGING  TECHNIQUE: Sequential images of the abdomen were obtained out to 60 minutes following intravenous administration of radiopharmaceutical.  RADIOPHARMACEUTICALS:  5.5 [place] AY-30Z Choletec  COMPARISON:  CT 06/18/2014  FINDINGS: There is prompt clearance radiotracer from the blood pool. Counts are evident within the small bowel by 30 minutes. Counts begin to fill the gallbladder by 18 minutes and continues to fill.  IMPRESSION: Filling of the gallbladder indicates patent cystic duct. No evidence of acute cholecystitis.   Electronically Signed   By: Suzy Bouchard M.D.   On: 06/19/2014 12:03   Pelvis Portable  06/19/2014   CLINICAL DATA:  Status post right hip replacement  EXAM: PORTABLE PELVIS 1-2 VIEWS  COMPARISON:  None.  FINDINGS: Right hip replacement is now seen. No acute bony abnormality is seen. Soft tissue changes are noted consistent with the recent surgery. The pelvic ring is visualized is intact.  IMPRESSION: Status post right hip replacement without acute abnormality.   Electronically Signed   By: Inez Catalina M.D.   On: 06/19/2014 17:36   Dg Chest Port 1 View  06/21/2014   CLINICAL DATA:  Hypertension.  Short of breath today.  EXAM: PORTABLE CHEST - 1 VIEW  COMPARISON:  06/20/2014  FINDINGS: Mild enlargement of the cardiopericardial silhouette, stable. No mediastinal or hilar masses. Mild basilar opacity likely due to atelectasis.  Possible small effusions. No overt pulmonary edema or convincing pneumonia. No pneumothorax.  Bony thorax is demineralized but grossly intact.  IMPRESSION: Mild lung base opacity likely atelectasis. Probable associated small effusions. No overt pulmonary edema or convincing pneumonia. Findings are similar to the previous day's study.   Electronically Signed   By: Lajean Manes M.D.   On: 06/21/2014 16:39   Dg  Chest Port 1 View  06/20/2014   CLINICAL DATA:  Lower left chest pain.  Hypertension  EXAM: PORTABLE CHEST - 1 VIEW  COMPARISON:  June 17, 2014  FINDINGS: There is atelectatic change in the left base. There is underlying emphysema. Lungs elsewhere clear. Heart is mildly enlarged with pulmonary vascularity within normal limits. No adenopathy. No pneumothorax. There is atherosclerotic change in the aorta. Bones are osteoporotic.  IMPRESSION: Left base atelectasis.  Underlying emphysema.  Cardiomegaly.   Electronically Signed   By: Lowella Grip III M.D.   On: 06/20/2014 10:11   Dg C-arm 61-120 Min-no Report  06/19/2014   CLINICAL DATA: right anterior hip   C-ARM 61-120 MINUTES  Fluoroscopy was utilized by the requesting physician.  No radiographic  interpretation.    Dg Hip Unilat With Pelvis 2-3 Views Right  06/17/2014   CLINICAL DATA:  Fall this morning with right hip pain and lower extremity foreshortening. Initial encounter.  EXAM: RIGHT HIP (WITH PELVIS) 2-3 VIEWS  COMPARISON:  None.  FINDINGS: There is evidence of an acute, complete subcapital fracture of the proximal femur with mild displacement. No dislocation. No bony lesions are seen. The bony pelvis is intact.  IMPRESSION: Acute subcapital fracture of the right hip.   Electronically Signed   By: Aletta Edouard M.D.   On: 06/17/2014 08:07   Dg Femur, Min 2 Views Right  06/17/2014   CLINICAL DATA:  Recent fall with known hip fracture  EXAM: RIGHT FEMUR 2 VIEWS  COMPARISON:  None.  FINDINGS: Subcapital femoral neck fracture is noted with  some impaction at the fracture site. Degenerative changes about the knee joint are seen. Diffuse vascular calcifications are noted. No other fractures are seen.  IMPRESSION: Subcapital right femoral neck fracture. No other acute abnormality is seen.   Electronically Signed   By: Inez Catalina M.D.   On: 06/17/2014 12:09   US Abdomen Limited Ruq  06/18/2014   CLINICAL DATA:  Abnormal elevated liver enzymes, elevated creatinine and  EXAM: US ABDOMEN LIMITED - RIGHT UPPER QUADRANT  COMPARISON:  None.  FINDINGS: Gallbladder:  Distended to at least 7-8 mm. There is a 2.6 cm stone. Wall thickness is within normal limits at 2 mm and there is no Murphy's sign.  Common bile duct:  Diameter: Measures between 3-6 mm  Liver:  1 cm cyst in the left lobe. No biliary dilatation. No focal abnormalities.  IMPRESSION: Study limited by patient in mobility related to hip fracture. Gallbladder is difficult to completely image. There is a gallstone with no Murphy's sign.   Electronically Signed   By: Skipper Cliche M.D.   On: 06/18/2014 13:29     IMPRESSION/RECOMMENDATION:  1. Hip fracture-s/p repair 2.   Elevated troponin- this likely represents demand ischemia, especially since she's had no anginal symptoms but did peak at 1.55. 2D echo showed normal LVF with moderate LVH and severe basal septal hypertrophy and moderate TR with moderate pulmonary HTN. There was no evidence of intracavitary gradient. She could very well have underlying CAD but Dr. Debara Pickett spoke with her son, it was felt that it would be unlikely for her to agree to coronary catheterization she had stress testing and it was abnormal. 2D echo with normal LVF and no RWMA's. No further workup at this time. She did well with hip surgery and has no complaints today. 3.   Post op atrial fibrillation - started POD #1. Now in NSR   Agree patient is not a good candidate  for long term anticoagulation. continue  Lopressor to 75mg  nBID.    Jenkins Rouge, MD    06/23/2014  9:04 AM

## 2014-06-24 DIAGNOSIS — N390 Urinary tract infection, site not specified: Secondary | ICD-10-CM

## 2014-06-24 DIAGNOSIS — T8351XD Infection and inflammatory reaction due to indwelling urinary catheter, subsequent encounter: Secondary | ICD-10-CM

## 2014-06-24 LAB — URINE CULTURE

## 2014-06-24 LAB — BASIC METABOLIC PANEL
Anion gap: 10 (ref 5–15)
BUN: 22 mg/dL (ref 6–23)
CALCIUM: 8.5 mg/dL (ref 8.4–10.5)
CO2: 26 mmol/L (ref 19–32)
CREATININE: 0.93 mg/dL (ref 0.50–1.10)
Chloride: 103 mmol/L (ref 96–112)
GFR calc non Af Amer: 51 mL/min — ABNORMAL LOW (ref >90)
GFR, EST AFRICAN AMERICAN: 59 mL/min — AB (ref >90)
Glucose, Bld: 111 mg/dL — ABNORMAL HIGH (ref 70–99)
Potassium: 3.2 mmol/L — ABNORMAL LOW (ref 3.5–5.1)
Sodium: 139 mmol/L (ref 135–145)

## 2014-06-24 MED ORDER — HYDROCODONE-ACETAMINOPHEN 5-325 MG PO TABS: 1.0000 | ORAL_TABLET | Freq: Four times a day (QID) | ORAL | Status: DC | PRN

## 2014-06-24 MED ORDER — AMIODARONE HCL 200 MG PO TABS
200.0000 mg | ORAL_TABLET | Freq: Two times a day (BID) | ORAL | Status: DC
  Administered 2014-06-24: 200 mg via ORAL
  Filled 2014-06-24: qty 1

## 2014-06-24 MED ORDER — LABETALOL HCL 5 MG/ML IV SOLN
10.0000 mg | Freq: Once | INTRAVENOUS | Status: DC
  Filled 2014-06-24: qty 4

## 2014-06-24 MED ORDER — POTASSIUM CHLORIDE 20 MEQ/15ML (10%) PO SOLN
40.0000 meq | Freq: Once | ORAL | Status: AC
  Administered 2014-06-24: 40 meq via ORAL
  Filled 2014-06-24: qty 30

## 2014-06-24 MED ORDER — AMLODIPINE BESYLATE 10 MG PO TABS
10.0000 mg | ORAL_TABLET | Freq: Every day | ORAL | Status: DC
  Administered 2014-06-24: 10 mg via ORAL
  Filled 2014-06-24: qty 1

## 2014-06-24 MED ORDER — LEVOFLOXACIN 750 MG PO TABS: 750.0000 mg | ORAL_TABLET | Freq: Once | ORAL | Status: DC

## 2014-06-24 MED ORDER — POTASSIUM CHLORIDE 20 MEQ/15ML (10%) PO SOLN: 20.0000 meq | Freq: Every day | ORAL | Status: DC

## 2014-06-24 MED ORDER — AMIODARONE HCL 200 MG PO TABS: 200.0000 mg | ORAL_TABLET | Freq: Two times a day (BID) | ORAL | Status: DC

## 2014-06-24 MED ORDER — METOPROLOL TARTRATE 25 MG PO TABS: 75.0000 mg | ORAL_TABLET | Freq: Two times a day (BID) | ORAL | Status: DC

## 2014-06-24 MED ORDER — LEVOFLOXACIN 750 MG PO TABS
750.0000 mg | ORAL_TABLET | ORAL | Status: DC
  Administered 2014-06-24: 750 mg via ORAL
  Filled 2014-06-24: qty 1

## 2014-06-24 NOTE — Progress Notes (Signed)
Physical Therapy Treatment Patient Details Name: Carrie Moran MRN: [id number] DOB: Nov 29, 1917 Today's Date: 06/24/2014    History of Present Illness 81 yo female fell at  [facility]-displaced R femoral neck fracture. S/P R hip hemiarthroplasty  via anterior approach.    PT Comments    Patient tolerated  Stand pivot with  RW and pivot to [facility]. Plans snf.  Follow Up Recommendations  SNF;Supervision/Assistance - 24 hour     Equipment Recommendations  None recommended by PT    Recommendations for Other Services       Precautions / Restrictions Precautions Precautions: Fall Restrictions RLE Weight Bearing: Weight bearing as tolerated    Mobility  Bed Mobility Overal bed mobility: Needs Assistance Bed Mobility: Supine to Sit;Sit to Supine     Supine to sit: Max assist;HOB elevated Sit to supine: Max assist   General bed mobility comments: assist for trunk to upright, assist R leg back into bed.  Transfers Overall transfer level: Needs assistance Equipment used: Rolling walker (2 wheeled) Transfers: Sit to/from [facility] Sit to Stand: +2 safety/equipment;Mod assist;From elevated surface Stand pivot transfers: +2 safety/equipment;Mod assist       General transfer comment: patient stood x 3 from bed for peri care(Incontinent of BM), stand pivot to [facility] then back to the bed with mod-max assist.  Ambulation/Gait                 Stairs            Wheelchair Mobility    Modified Rankin (Stroke Patients Only)       Balance   Sitting-balance support: Bilateral upper extremity supported;Feet supported   Sitting balance - Comments: initially listing to the Left but gradually gained midline Postural control: Left lateral lean Standing balance support: During functional activity;Bilateral upper extremity supported Standing balance-Leahy Scale: Poor Standing balance comment: decreased weight on R leg                    Cognition  Arousal/Alertness: Awake/alert Behavior During Therapy: WFL for tasks assessed/performed Overall Cognitive Status: History of cognitive impairments - at baseline                      Exercises      General Comments        Pertinent Vitals/Pain Faces Pain Scale: Hurts even more Pain Location: R thigh/hip when sitting up    Home Living                      Prior Function            PT Goals (current goals can now be found in the care plan section) Progress towards PT goals: Progressing toward goals    Frequency  Min 3X/week    PT Plan Current plan remains appropriate    Co-evaluation             End of Session Equipment Utilized During Treatment: Gait belt Activity Tolerance: Patient tolerated treatment well Patient left: in bed;with call bell/phone within reach;with bed alarm set     Time: 3474-2595 PT Time Calculation (min) (ACUTE ONLY): 23 min  Charges:  $Therapeutic Activity: 23-37 mins                    G Codes:      Claretha Cooper 06/24/2014, 12:19 PM Tresa Endo PT [phone]

## 2014-06-24 NOTE — Progress Notes (Signed)
Patient ID: Carrie Moran, female   DOB: [birth year], 81 y.o.   MRN: [id number]   SUBJECTIVE:  No complaints hip ok  No dyspnea or palpitations   OBJECTIVE:   Vitals:   Filed Vitals:   06/23/14 2000 06/23/14 2252 06/24/14 0704 06/24/14 0726  BP:  194/82 178/102   Pulse:  62 89   Temp:  97.5 F (36.4 C) 98.3 F (36.8 C)   TempSrc:  Oral Oral   Resp: 23 23 23 18   Height:      Weight:      SpO2: 97% 96% 99% 98%   I&O's:    Intake/Output Summary (Last 24 hours) at 06/24/14 0835 Last data filed at 06/24/14 9379  Gross per 24 hour  Intake    700 ml  Output   4600 ml  Net  -3900 ml   TELEMETRY: Reviewed telemetry pt in NSR with PAC's with several short bursts of atrial tachcyardia     PHYSICAL EXAM General: Frail elderly female  Head: Eyes PERRLA, No xanthomas.   Normal cephalic and atramatic  Lungs:   Clear bilaterally to auscultation and percussion. Heart:  RRR with SEM  Abdomen: Bowel sounds are positive, abdomen soft and non-tender without masses Extremities:   No clubbing, cyanosis poor pulses below knees  Neuro: Alert and oriented X 3. Psych:  Good affect, responds appropriately Post anterior approach hip repair Foley catheter in place     Current facility-administered medications:  .  acetaminophen (TYLENOL) tablet 650 mg, 650 mg, Oral, Q6H PRN **OR** acetaminophen (TYLENOL) suppository 650 mg, 650 mg, Rectal, Q6H PRN, Rod Can, MD .  albuterol (PROVENTIL) (2.5 MG/3ML) 0.083% nebulizer solution 2.5 mg, 2.5 mg, Nebulization, Q4H PRN, Orson Eva, MD, 2.5 mg at 06/22/14 1840 .  amLODipine (NORVASC) tablet 5 mg, 5 mg, Oral, Daily, Orson Eva, MD, 5 mg at 06/23/14 1127 .  aspirin chewable tablet 81 mg, 81 mg, Oral, Daily, Orson Eva, MD, 81 mg at 06/23/14 0240 .  ceFEPIme (MAXIPIME) 1 g in dextrose 5 % 50 mL IVPB, 1 g, Intravenous, Q24H, Anh P Pham, RPH, 1 g at 06/23/14 2117 .  cilostazol (PLETAL) tablet 50 mg, 50 mg, Oral, BID, Venetia Maxon Rama, MD, 50 mg at  06/23/14 2118 .  cloNIDine (CATAPRES) tablet 0.1 mg, 0.1 mg, Oral, BID, Venetia Maxon Rama, MD, 0.1 mg at 06/23/14 2118 .  enoxaparin (LOVENOX) injection 30 mg, 30 mg, Subcutaneous, Q24H, Rod Can, MD, 30 mg at 06/24/14 0726 .  gabapentin (NEURONTIN) capsule 100 mg, 100 mg, Oral, QHS, Venetia Maxon Rama, MD, 100 mg at 06/23/14 2118 .  HYDROcodone-acetaminophen (NORCO/VICODIN) 5-325 MG per tablet 1-2 tablet, 1-2 tablet, Oral, Q6H PRN, Orson Eva, MD, 1 tablet at 06/22/14 2206 .  latanoprost (XALATAN) 0.005 % ophthalmic solution 1 drop, 1 drop, Both Eyes, Daily, Venetia Maxon Rama, MD, 1 drop at 06/23/14 [phone] .  menthol-cetylpyridinium (CEPACOL) lozenge 3 mg, 1 lozenge, Oral, PRN **OR** phenol (CHLORASEPTIC) mouth spray 1 spray, 1 spray, Mouth/Throat, PRN, Rod Can, MD .  metoCLOPramide (REGLAN) tablet 5-10 mg, 5-10 mg, Oral, Q8H PRN **OR** metoCLOPramide (REGLAN) injection 5-10 mg, 5-10 mg, Intravenous, Q8H PRN, Rod Can, MD .  metoprolol tartrate (LOPRESSOR) tablet 75 mg, 75 mg, Oral, BID, Sueanne Margarita, MD, 75 mg at 06/23/14 2118 .  mirabegron ER (MYRBETRIQ) tablet 25 mg, 25 mg, Oral, Daily, Venetia Maxon Rama, MD, 25 mg at 06/23/14 0919 .  ondansetron (ZOFRAN) tablet 4 mg, 4 mg, Oral, Q6H PRN **OR** ondansetron (  ZOFRAN) injection 4 mg, 4 mg, Intravenous, Q6H PRN, Rod Can, MD .  pantoprazole (PROTONIX) EC tablet 40 mg, 40 mg, Oral, Daily, Venetia Maxon Rama, MD, 40 mg at 06/23/14 0919 .  polyethylene glycol (MIRALAX / GLYCOLAX) packet 17 g, 17 g, Oral, Daily, Venetia Maxon Rama, MD, 17 g at 06/23/14 0918 .  senna-docusate (Senokot-S) tablet 2 tablet, 2 tablet, Oral, Daily PRN, Christina P Rama, MD .  timolol (TIMOPTIC) 0.5 % ophthalmic solution 1 drop, 1 drop, Both Eyes, Daily, Venetia Maxon Rama, MD, 1 drop at 06/23/14 [phone] .  topiramate (TOPAMAX) tablet 25 mg, 25 mg, Oral, BID, Venetia Maxon Rama, MD, 25 mg at 06/23/14 2118  LABS: Basic Metabolic Panel:  Recent Labs  06/23/14 0559  06/24/14 0445  NA 143 139  K 3.3* 3.2*  CL 109 103  CO2 25 26  GLUCOSE 97 111*  BUN 30* 22  CREATININE 0.95 0.93  CALCIUM 8.3* 8.5   Liver Function Tests: No results for input(s): AST, ALT, ALKPHOS, BILITOT, PROT, ALBUMIN in the last 72 hours. No results for input(s): LIPASE, AMYLASE in the last 72 hours. CBC:  Recent Labs  06/22/14 0607  WBC 7.5  HGB 10.3*  HCT 31.9*  MCV 94.9  PLT 125*   Thyroid Function Tests:  Recent Labs  06/22/14 0607  TSH 3.660   Anemia Panel: No results for input(s): VITAMINB12, FOLATE, FERRITIN, TIBC, IRON, RETICCTPCT in the last 72 hours. Coag Panel:   Lab Results  Component Value Date   INR 1.37 06/17/2014   INR 1.03 05/02/2009    RADIOLOGY: Ct Abdomen Pelvis Wo Contrast  06/19/2014   CLINICAL DATA:  Abdominal pain.  Leukocytosis.  EXAM: CT ABDOMEN AND PELVIS WITHOUT CONTRAST  TECHNIQUE: Multidetector CT imaging of the abdomen and pelvis was performed following the standard protocol without IV contrast.  COMPARISON:  06/17/2014 radiographs  FINDINGS: There are unremarkable unenhanced appearances of the liver, spleen, and pancreas. There is a 2.0 cm calculus within the gallbladder lumen. There is no bile duct dilatation. The right kidney is hydronephrotic, with abrupt caliber transition at the ureteropelvic junction. This may represent a chronic UPJ obstruction. No obstructing calculus or mass is evident. The left kidney is normal. There is no evidence of bowel obstruction or extraluminal air. There is mild fluid and air distention of small and large bowel without caliber transition. There is a prominent rectal stool mass which may represent a degree of fecal impaction. There is colonic diverticulosis. There is no evidence of diverticulitis or other focal acute inflammatory process in the abdomen or pelvis. There is no ascites.  In the lower chest there are small bilateral effusions, right greater than left. Lung bases are clear.  There is a  subcapital right hip fracture. No other acute musculoskeletal abnormalities are evident. Moderately severe degenerative disc and facet changes are present from L2 through the sacrum.  IMPRESSION: 1. Cholelithiasis 2. Right hydronephrosis, perhaps a chronic UPJ obstruction. No obstructing mass or calculus is evident. 3. Diverticulosis 4. The rectum is distended with stool, raising the question of fecal impaction. There is moderate distension of all of the small and large bowel without caliber transition. 5. Small bilateral pleural effusions 6. Subcapital right hip fracture   Electronically Signed   By: Andreas Newport M.D.   On: 06/19/2014 03:11   Dg Chest 1 View  06/17/2014   CLINICAL DATA:  Pain following fall  EXAM: CHEST  1 VIEW  COMPARISON:  October 12, 2013  FINDINGS: There  is no edema or consolidation. Heart is slightly enlarged with pulmonary vascularity within normal limits. No adenopathy. There is atherosclerotic change in the aorta. No pneumothorax. No acute fracture. There is extensive arthropathy in each shoulder. There is evidence of old trauma involving the left third rib.  IMPRESSION: No edema or consolidation. No change in cardiac silhouette. No pneumothorax.   Electronically Signed   By: Lowella Grip III M.D.   On: 06/17/2014 08:09   Dg Pelvis 1-2 Views  06/20/2014   CLINICAL DATA:  Postop hip replacement  EXAM: PELVIS - 1-2 VIEW  COMPARISON:  06/19/2014  FINDINGS: Right hip hemiarthroplasty in satisfactory position alignment. No fracture or complication. No change from yesterday  IMPRESSION: Satisfactory right hip hemiarthroplasty.   Electronically Signed   By: Franchot Gallo M.D.   On: 06/20/2014 10:11   Dg Abd 1 View  06/17/2014   CLINICAL DATA:  Abdominal distension. History of renal insufficiency.  EXAM: ABDOMEN - 1 VIEW  COMPARISON:  CT 01/30/2010  FINDINGS: There is a displaced subcapital right femur fracture. Left hip appears intact. Large amount of gas and stool within the  abdomen and pelvis. In particular, there is a large amount of gas in the mid abdomen. Limited evaluation for free air on this supine image. Levoscoliosis of the lumbar spine.  IMPRESSION: Large amount of gas and stool throughout the abdomen and pelvis.  Right subcapital femur fracture.   Electronically Signed   By: Markus Daft M.D.   On: 06/17/2014 20:15   Nm Hepatobiliary Liver Func  06/19/2014   CLINICAL DATA:  Cholelithiasis on CT.  Evaluate for cholecystitis.  EXAM: NUCLEAR MEDICINE HEPATOBILIARY IMAGING  TECHNIQUE: Sequential images of the abdomen were obtained out to 60 minutes following intravenous administration of radiopharmaceutical.  RADIOPHARMACEUTICALS:  5.5 Mill[employer]ie PP-50D Choletec  COMPARISON:  CT 06/18/2014  FINDINGS: There is prompt clearance radiotracer from the blood pool. Counts are evident within the small bowel by 30 minutes. Counts begin to fill the gallbladder by 18 minutes and continues to fill.  IMPRESSION: Filling of the gallbladder indicates patent cystic duct. No evidence of acute cholecystitis.   Electronically Signed   By: Suzy Bouchard M.D.   On: 06/19/2014 12:03   Pelvis Portable  06/19/2014   CLINICAL DATA:  Status post right hip replacement  EXAM: PORTABLE PELVIS 1-2 VIEWS  COMPARISON:  None.  FINDINGS: Right hip replacement is now seen. No acute bony abnormality is seen. Soft tissue changes are noted consistent with the recent surgery. The pelvic ring is visualized is intact.  IMPRESSION: Status post right hip replacement without acute abnormality.   Electronically Signed   By: Inez Catalina M.D.   On: 06/19/2014 17:36   Dg Chest Port 1 View  06/21/2014   CLINICAL DATA:  Hypertension.  Short of breath today.  EXAM: PORTABLE CHEST - 1 VIEW  COMPARISON:  06/20/2014  FINDINGS: Mild enlargement of the cardiopericardial silhouette, stable. No mediastinal or hilar masses. Mild basilar opacity likely due to atelectasis. Possible small effusions. No overt pulmonary edema or  convincing pneumonia. No pneumothorax.  Bony thorax is demineralized but grossly intact.  IMPRESSION: Mild lung base opacity likely atelectasis. Probable associated small effusions. No overt pulmonary edema or convincing pneumonia. Findings are similar to the previous day's study.   Electronically Signed   By: Lajean Manes M.D.   On: 06/21/2014 16:39   Dg Chest Port 1 View  06/20/2014   CLINICAL DATA:  Lower left chest pain.  Hypertension  EXAM: PORTABLE CHEST - 1 VIEW  COMPARISON:  June 17, 2014  FINDINGS: There is atelectatic change in the left base. There is underlying emphysema. Lungs elsewhere clear. Heart is mildly enlarged with pulmonary vascularity within normal limits. No adenopathy. No pneumothorax. There is atherosclerotic change in the aorta. Bones are osteoporotic.  IMPRESSION: Left base atelectasis.  Underlying emphysema.  Cardiomegaly.   Electronically Signed   By: Lowella Grip III M.D.   On: 06/20/2014 10:11   Dg C-arm 61-120 Min-no Report  06/19/2014   CLINICAL DATA: right anterior hip   C-ARM 61-120 MINUTES  Fluoroscopy was utilized by the requesting physician.  No radiographic  interpretation.    Dg Hip Unilat With Pelvis 2-3 Views Right  06/17/2014   CLINICAL DATA:  Fall this morning with right hip pain and lower extremity foreshortening. Initial encounter.  EXAM: RIGHT HIP (WITH PELVIS) 2-3 VIEWS  COMPARISON:  None.  FINDINGS: There is evidence of an acute, complete subcapital fracture of the proximal femur with mild displacement. No dislocation. No bony lesions are seen. The bony pelvis is intact.  IMPRESSION: Acute subcapital fracture of the right hip.   Electronically Signed   By: Aletta Edouard M.D.   On: 06/17/2014 08:07   Dg Femur, Min 2 Views Right  06/17/2014   CLINICAL DATA:  Recent fall with known hip fracture  EXAM: RIGHT FEMUR 2 VIEWS  COMPARISON:  None.  FINDINGS: Subcapital femoral neck fracture is noted with some impaction at the fracture site. Degenerative  changes about the knee joint are seen. Diffuse vascular calcifications are noted. No other fractures are seen.  IMPRESSION: Subcapital right femoral neck fracture. No other acute abnormality is seen.   Electronically Signed   By: Inez Catalina M.D.   On: 06/17/2014 12:09   US Abdomen Limited Ruq  06/18/2014   CLINICAL DATA:  Abnormal elevated liver enzymes, elevated creatinine and  EXAM: US ABDOMEN LIMITED - RIGHT UPPER QUADRANT  COMPARISON:  None.  FINDINGS: Gallbladder:  Distended to at least 7-8 mm. There is a 2.6 cm stone. Wall thickness is within normal limits at 2 mm and there is no Murphy's sign.  Common bile duct:  Diameter: Measures between 3-6 mm  Liver:  1 cm cyst in the left lobe. No biliary dilatation. No focal abnormalities.  IMPRESSION: Study limited by patient in mobility related to hip fracture. Gallbladder is difficult to completely image. There is a gallstone with no Murphy's sign.   Electronically Signed   By: Skipper Cliche M.D.   On: 06/18/2014 13:29     IMPRESSION/RECOMMENDATION:  1.   Hip fracture-s/p repair 2.   Elevated troponin- this likely represents demand ischemia, especially since she's had no anginal symptoms but did peak at 1.55. 2D echo showed normal LVF with moderate LVH and severe basal septal hypertrophy and moderate TR with moderate pulmonary HTN. There was no evidence of intracavitary gradient. She could very well have underlying CAD but Dr. Debara Pickett spoke with her son, it was felt that it would be unlikely for her to agree to coronary catheterization she had stress testing and it was abnormal. 2D echo with normal LVF and no RWMA's. No further workup at this time. She did well with hip surgery and has no complaints today. 3.   Post op atrial fibrillation - started POD #1. Now in NSR   Agree patient is not a good candidate for long term anticoagulation. continue  Lopressor to 75mg  BID. Still with significant atrial ectomy start  amiodarone 200 bid    4.   HTN:  Increase amlodipine to 10 mg  Primary service can also adjust clonidine  As needed  Will sign off ? To rehab  Code Status DNR    Jenkins Rouge, MD  06/24/2014  8:35 AM

## 2014-06-24 NOTE — Progress Notes (Signed)
Patient is set to discharge to [facility] today. Patient & son, [name] aware. Discharge packet given to RN, Elzie Rings. [facility] called for transport.   Clinical Social Work Department CLINICAL SOCIAL WORK PLACEMENT NOTE 06/24/2014  Patient:  Carrie Moran, Carrie Moran  Account Number:  [id number] Admit date:  06/17/2014  Clinical Social Worker:  Renold Genta  Date/time:  06/17/2014 04:16 PM  Clinical Social Work is seeking post-discharge placement for this patient at the following level of care:   SKILLED NURSING   (*CSW will update this form in Epic as items are completed)   06/17/2014  Patient/family provided with [facility] Department of Clinical Social Work's list of facilities offering this level of care within the geographic area requested by the patient (or if unable, by the patient's family).  06/17/2014  Patient/family informed of their freedom to choose among providers that offer the needed level of care, that participate in Medicare, Medicaid or managed care program needed by the patient, have an available bed and are willing to accept the patient.  06/17/2014  Patient/family informed of [facility]' ownership interest in [facility], as well as of the fact that they are under no obligation to receive care at this facility.  PASARR submitted to EDS on 06/17/2014 PASARR number received on 06/17/2014  FL2 transmitted to all facilities in geographic area requested by pt/family on  06/17/2014 FL2 transmitted to all facilities within larger geographic area on   Patient informed that his/her managed care company has contracts with or will negotiate with  certain facilities, including the following:     Patient/family informed of bed offers received:  06/24/2014 Patient chooses bed at [facility] Physician recommends and patient chooses bed at    Patient to be transferred to [facility] on  06/24/2014 Patient to be transferred to facility by [facility] Patient and family  notified of transfer on 06/24/2014 Name of family member notified:  patient's son, [name] via phone  The following physician request were entered in Epic:   Additional Comments:     Raynaldo Opitz, [place] Social Worker cell #: [phone]

## 2014-06-24 NOTE — Progress Notes (Signed)
Report called to [employer] at [facility]. All questions answered. Contact number provided for any further questions.

## 2014-06-24 NOTE — Progress Notes (Signed)
Pt blood pressure rechecked, 160/75.  No longer needs labetal, one time dose.  Pt wheeled out by [facility].

## 2014-06-26 ENCOUNTER — Non-Acute Institutional Stay (SKILLED_NURSING_FACILITY): Payer: Medicare Other | Admitting: Internal Medicine

## 2014-06-26 DIAGNOSIS — D638 Anemia in other chronic diseases classified elsewhere: Secondary | ICD-10-CM | POA: Diagnosis not present

## 2014-06-26 DIAGNOSIS — I739 Peripheral vascular disease, unspecified: Secondary | ICD-10-CM

## 2014-06-26 DIAGNOSIS — S72141S Displaced intertrochanteric fracture of right femur, sequela: Secondary | ICD-10-CM

## 2014-06-26 DIAGNOSIS — Z049 Encounter for examination and observation for unspecified reason: Secondary | ICD-10-CM | POA: Diagnosis not present

## 2014-06-26 DIAGNOSIS — R740 Nonspecific elevation of levels of transaminase and lactic acid dehydrogenase [LDH]: Secondary | ICD-10-CM | POA: Diagnosis not present

## 2014-06-26 DIAGNOSIS — R51 Headache: Secondary | ICD-10-CM

## 2014-06-26 DIAGNOSIS — R5381 Other malaise: Secondary | ICD-10-CM

## 2014-06-26 DIAGNOSIS — N3281 Overactive bladder: Secondary | ICD-10-CM

## 2014-06-26 DIAGNOSIS — R6889 Other general symptoms and signs: Secondary | ICD-10-CM

## 2014-06-26 DIAGNOSIS — N179 Acute kidney failure, unspecified: Secondary | ICD-10-CM

## 2014-06-26 DIAGNOSIS — K219 Gastro-esophageal reflux disease without esophagitis: Secondary | ICD-10-CM

## 2014-06-26 DIAGNOSIS — I480 Paroxysmal atrial fibrillation: Secondary | ICD-10-CM

## 2014-06-26 DIAGNOSIS — I10 Essential (primary) hypertension: Secondary | ICD-10-CM

## 2014-06-26 DIAGNOSIS — J189 Pneumonia, unspecified organism: Secondary | ICD-10-CM

## 2014-06-26 DIAGNOSIS — R7401 Elevation of levels of liver transaminase levels: Secondary | ICD-10-CM

## 2014-06-26 DIAGNOSIS — N189 Chronic kidney disease, unspecified: Secondary | ICD-10-CM

## 2014-06-26 DIAGNOSIS — E876 Hypokalemia: Secondary | ICD-10-CM | POA: Diagnosis not present

## 2014-06-26 DIAGNOSIS — G8929 Other chronic pain: Secondary | ICD-10-CM

## 2014-06-26 NOTE — Progress Notes (Signed)
Patient ID: Carrie Moran, female   DOB: February 02, 1918, 81 y.o.   MRN: [id number]     [facility]   PCP: Blanchie Serve, MD  Code Status: DNR  Allergies  Allergen Reactions  . Sulfa Antibiotics     Unknown reaction.     Chief Complaint  Patient presents with  . New Admit To SNF     HPI:  81 year old patient is here for short term rehabilitation post hospital admission from 06/17/14-06/24/14 with fall. She sustained right femoral neck fracture and underwent right hemiarthroplasty. She then developed afib with RVR, required cardizem gtt and her metoprolol dosing was adjusted. She was also treated for HCAP and pseudomonas UTI with acute on chronic renal failure in the hospital. She had LUE swelling and dvt was ruled out. She has PMH of OAB, depression, HTN, PVD, CKD among others She is seen in her room today. She is pleasantly confused, can make her needs known and denies any concerns today. She has worked with therapy today and is tired. Pain is under control with current regimen.   Review of Systems:  Constitutional: Negative for fever, chills, diaphoresis.  HENT: Negative for headache, congestion Eyes: Negative for eye pain, blurred vision, double vision and discharge.  Respiratory: Negative for cough, shortness of breath and wheezing.   Cardiovascular: Negative for chest pain, palpitations, leg swelling.  Gastrointestinal: Negative for heartburn, nausea, vomiting, abdominal pain. Had a bowel movement yesterday. Genitourinary: Negative for dysuria and flank pain.  Musculoskeletal: Negative for back pain, falls Skin: Negative for itching, rash.  Neurological: Negative for dizziness, tingling, focal weakness Psychiatric/Behavioral: Negative for depression   Past Medical History  Diagnosis Date  . Hypertension   . Memory loss   . GERD (gastroesophageal reflux disease)   . RLS (restless legs syndrome)   . Hypertonicity of bladder   . Renal  insufficiency   . Other headache syndromes(339.89)   . Osteoporosis, unspecified   . Urinary frequency   . Accelerated hypertension 07/09/2013  . Stage III chronic kidney disease 06/17/2014  . Hip fracture requiring operative repair 06/17/2014  . Primary osteoarthritis involving multiple joints 09/24/2013  . PVD (peripheral vascular disease) 11/07/2012  . Glaucoma 11/07/2012  . Depression 11/07/2012  . Overactive bladder 10/17/2012   Past Surgical History  Procedure Laterality Date  . Appendectomy  1930  . Rotator cuff repair  1986  . Rotator cuff repair  2001  . Carpal tunnel release  2006    Bilateral   . Total hip arthroplasty Right 06/19/2014    Procedure: RIGHT HIP HEMI ANTERIOR APPROACH;  Surgeon: Rod Can, MD;  Location: [facility];  Service: Orthopedics;  Laterality: Right;   Social History:   reports that she has quit smoking. She has never used smokeless tobacco. She reports that she does not drink alcohol or use illicit drugs.  Family History  Problem Relation Age of Onset  . Heart attack Mother   . Cancer Brother   . Cancer Brother     Medications: Patient's Medications  New Prescriptions   No medications on file  Previous Medications   ALBUTEROL (PROVENTIL) (2.5 MG/3ML) 0.083% NEBULIZER SOLUTION    Take 3 mLs (2.5 mg total) by nebulization every 4 (four) hours as needed for wheezing or shortness of breath.   AMBULATORY NON FORMULARY MEDICATION    Ted compression stockings for both legs- wear them in the morning and remove it before going to bed   AMIODARONE (PACERONE) 200  MG TABLET    Take 1 tablet (200 mg total) by mouth 2 (two) times daily.   AMLODIPINE (NORVASC) 10 MG TABLET    Take one tablet once daily to control Blood pressure   ASPIRIN 81 MG TABLET    Take 81 mg by mouth daily.   CILOSTAZOL (PLETAL) 50 MG TABLET    Take 50 mg by mouth 2 (two) times daily.   CLONIDINE (CATAPRES) 0.1 MG TABLET    Take 1 tablet (0.1 mg total) by mouth 2 (two) times daily.    GABAPENTIN (NEURONTIN) 100 MG CAPSULE    Take 1 capsule (100 mg total) by mouth at bedtime.   HYDROCODONE-ACETAMINOPHEN (NORCO/VICODIN) 5-325 MG PER TABLET    Take 1-2 tablets by mouth every 6 (six) hours as needed for moderate pain.   LATANOPROST (XALATAN) 0.005 % OPHTHALMIC SOLUTION    Place 1 drop into both eyes daily. Instill 1 drop in each eye once daily for glaucoma.   LEVOFLOXACIN (LEVAQUIN) 750 MG TABLET    Take 1 tablet (750 mg total) by mouth once. On 06/26/14   METOPROLOL TARTRATE (LOPRESSOR) 25 MG TABLET    Take 3 tablets (75 mg total) by mouth 2 (two) times daily.   MIRABEGRON ER (MYRBETRIQ) 25 MG TB24 TABLET    Take 1 tablet (25 mg total) by mouth daily.   OMEPRAZOLE (PRILOSEC) 20 MG CAPSULE    Take 1 capsule (20 mg total) by mouth daily.   POTASSIUM CHLORIDE 20 MEQ/15ML (10%) SOLN    Take 15 mLs (20 mEq total) by mouth daily. X 4 days   SENNA-DOCUSATE (SENOKOT-S) 8.6-50 MG PER TABLET    Take 2 tablets by mouth daily as needed for mild constipation.   TIMOLOL MALEATE 0.5 % (DAILY) SOLN    Place 1 drop into both eyes daily.    TOPIRAMATE (TOPAMAX) 25 MG TABLET    Take 1 tablet (25 mg total) by mouth 2 (two) times daily.  Modified Medications   No medications on file  Discontinued Medications   No medications on file     Physical Exam: Filed Vitals:   06/26/14 0958  BP: 151/78  Pulse: 68  Temp: 97.6 F (36.4 C)  Resp: 16  SpO2: 95%    General- elderly female, frail, in no acute distress Head- normocephalic, atraumatic Throat- moist mucus membrane Neck- no cervical lymphadenopathy Cardiovascular- normal s1,s2, no murmurs, palpable dorsalis pedis and radial pulses, trace leg edema Respiratory- CTAB, no wheeze, no rhonchi, no crackles, no use of accessory muscles Abdomen- bowel sounds present, soft, non tender Musculoskeletal- able to move all 4 extremities, right leg rom limited, both arms ROM limited at shoulder right > left Neurological- no focal deficit Skin- warm  and dry, right outer ankle/ heel SDTI Psychiatry- alert and oriented to person, normal mood and affect    Labs reviewed: Basic Metabolic Panel:  Recent Labs  06/21/14 0555 06/22/14 0607 06/23/14 0559 06/24/14 0445  NA 146* 147* 143 139  K 3.9 3.5 3.3* 3.2*  CL 115* 116* 109 103  CO2 21 23 25 26   GLUCOSE 197* 114* 97 111*  BUN 41* 36* 30* 22  CREATININE 1.05 0.85 0.95 0.93  CALCIUM 8.8 8.5 8.3* 8.5  MG 1.9  --   --   --    Liver Function Tests:  Recent Labs  06/17/14 0750 06/18/14 0605 06/19/14 0555  AST 362* 115* 50*  ALT 244* 157* 95*  ALKPHOS 78 58 54  BILITOT 1.4* 0.8 0.7  PROT 6.2 5.5* 5.4*  ALBUMIN 3.6 3.2* 3.2*   No results for input(s): LIPASE, AMYLASE in the last 8760 hours. No results for input(s): AMMONIA in the last 8760 hours. CBC:  Recent Labs  07/09/13 1250  10/12/13 1853 06/17/14 0750  06/20/14 0522 06/21/14 0555 06/22/14 0607  WBC 4.0  < > 5.4 22.2*  < > 8.9 15.6* 7.5  NEUTROABS 1.6  --  4.2 17.8*  --   --   --   --   HGB 13.3  --  13.3 13.9  < > 12.2 11.6* 10.3*  HCT 39.8  --  39.4 45.1  < > 37.9 36.4 31.9*  MCV 92  --  91.4 97.2  < > 95.2 94.1 94.9  PLT  --   < > 120* 160  < > 129* 178 125*  < > = values in this interval not displayed. Cardiac Enzymes:  Recent Labs  06/18/14 0704 06/18/14 1242 06/18/14 1859  TROPONINI 1.55* 1.45* 0.96*    Assessment/Plan  Physical deconditioning Will have her work with physical therapy and occupational therapy team to help with gait training and muscle strengthening exercises.fall precautions. Skin care. Encourage to be out of bed.   Right hip fracture S/p right hemiarthroplasty. Continue norco 5-325 1-2 tab q6h prn for pain. Patient is supposed to be on lovenox for dvt prophylaxis but was not discharged from the hospital on it. Thus she has not received it for 2 days. start lovenox 30 mg sq for dvt prophylaxis for 4 weeks.  Hypokalemia Recheck bmp. Continue kcl  supplement  transaminitis Recheck lft, improved level at discharge time, denies abdominal pain  Anemia Monitor cbc  afib Rate controlled. Continue amiodarone 200 mg bid and lopressor 75 mg bid. Continue aspirin  HCAP Has completed her course of levaquin. Monitor clinically  Suspected deep tissue injury Continue skin prep, skin care and decubvite  HTN Elevated bp this am, continue amlodipine 10 mg daily and clonidine 0.1 mg bid, monitor bp q shift x 5 days and adjust med if needed  Acute on chronic renal failure Encourage hydration, monitor bmp  gerd Continue prilosec  PVD Continue pletal and aspirin  OAB Continue myrbetriq for now  Chronic headache Continue topamax  Goals of care: short term rehabilitation   Labs/tests ordered: cbc, cmp 06/27/14  Family/ staff Communication: reviewed care plan with patient and nursing supervisor    Blanchie Serve, MD  [facility] [phone] (Monday-Friday 8 am - 5 pm) [phone] (afterhours)

## 2014-06-28 LAB — CULTURE, BLOOD (ROUTINE X 2)
CULTURE: NO GROWTH
Culture: NO GROWTH

## 2014-07-10 ENCOUNTER — Telehealth: Payer: Self-pay | Admitting: *Deleted

## 2014-07-10 DIAGNOSIS — Z471 Aftercare following joint replacement surgery: Secondary | ICD-10-CM | POA: Diagnosis not present

## 2014-07-10 DIAGNOSIS — N183 Chronic kidney disease, stage 3 (moderate): Secondary | ICD-10-CM | POA: Diagnosis not present

## 2014-07-10 DIAGNOSIS — I129 Hypertensive chronic kidney disease with stage 1 through stage 4 chronic kidney disease, or unspecified chronic kidney disease: Secondary | ICD-10-CM | POA: Diagnosis not present

## 2014-07-10 DIAGNOSIS — Z96641 Presence of right artificial hip joint: Secondary | ICD-10-CM

## 2014-07-10 NOTE — Telephone Encounter (Signed)
Sarah with [facility] called and stated that Patient needs verbal orders for [facility] due to Lovenox injection and PT/OT. Verbal Orders given and informed her that patient needs an appointment for a Face to Face. Nurse agreed and will call back to schedule appointment.

## 2014-07-11 ENCOUNTER — Telehealth: Payer: Self-pay | Admitting: *Deleted

## 2014-07-11 NOTE — Telephone Encounter (Signed)
Noted, yes lets get her in next week with a provider to follow up on all issues

## 2014-07-11 NOTE — Telephone Encounter (Signed)
Carrey Epps with [facility] called and stated that patient had several Drug Interactions Level 1- Pacerone and Pletal Level 2-Catapress and Lopressor Level 2-Catapress and Kenalog  Had Right hip Fracture and returned from rehab on 4/5- patient is on Lovenox injections and missed one dose.  Informed nurse that patient need a follow up appointment with [place] and she stated that the daughter is going to be calling to schedule that. Was a former patient of Dr. Bubba Camp. I called and spoke with [name] and he will be calling back to schedule an appointment for patient after he checks his calendar. Agreed.

## 2014-07-15 ENCOUNTER — Telehealth: Payer: Self-pay | Admitting: *Deleted

## 2014-07-15 NOTE — Telephone Encounter (Signed)
[name] with [facility] called wanting orders for Occupational Therapy. In the last telephone note [name] requested patient to schedule an appointment. Informed therapist that patient needs an appointment before orders could be given. She agreed and will call back to schedule.

## 2014-07-21 ENCOUNTER — Ambulatory Visit: Payer: Self-pay | Admitting: Nurse Practitioner

## 2014-07-21 ENCOUNTER — Encounter: Payer: Self-pay | Admitting: Internal Medicine

## 2014-07-21 DIAGNOSIS — Z0289 Encounter for other administrative examinations: Secondary | ICD-10-CM

## 2014-07-24 ENCOUNTER — Encounter: Payer: Self-pay | Admitting: Nurse Practitioner

## 2014-07-24 ENCOUNTER — Ambulatory Visit (INDEPENDENT_AMBULATORY_CARE_PROVIDER_SITE_OTHER): Payer: Medicare Other | Admitting: Nurse Practitioner

## 2014-07-24 VITALS — BP 138/80 | HR 56 | Temp 98.1°F

## 2014-07-24 DIAGNOSIS — D638 Anemia in other chronic diseases classified elsewhere: Secondary | ICD-10-CM | POA: Diagnosis not present

## 2014-07-24 DIAGNOSIS — S72141S Displaced intertrochanteric fracture of right femur, sequela: Secondary | ICD-10-CM

## 2014-07-24 DIAGNOSIS — I4891 Unspecified atrial fibrillation: Secondary | ICD-10-CM | POA: Diagnosis not present

## 2014-07-24 DIAGNOSIS — I10 Essential (primary) hypertension: Secondary | ICD-10-CM

## 2014-07-24 DIAGNOSIS — E876 Hypokalemia: Secondary | ICD-10-CM

## 2014-07-24 NOTE — Progress Notes (Signed)
Patient ID: Carrie Moran, female   DOB: 09-27-1917, 81 y.o.   MRN: [id number]    PCP: Blanchie Serve, MD  Allergies  Allergen Reactions  . Sulfa Antibiotics     Unknown reaction.     Chief Complaint  Patient presents with  . Follow-up    Patient is here to follow-up from [facility])  . Immunizations    Discuss need for prevnar, patient would like for NP to decide if she should receive     HPI: Patient is a 81 y.o. female seen in the office today for nursing home follow up. She has PMH of OAB, depression, HTN, PVD, CKD among others. Pt was in [facility] for 1.5 weeks, back at her assisted living for 2 weeks. She sustained right femoral neck fracture and underwent right hemiarthroplasty. Pt was in afib with RVR, required cardizem gtt and her metoprolol dosing was adjusted. She was also treated for HCAP and pseudomonas UTI with acute on chronic renal failure post surgery in the hospital. No shortness of breath, chest pains, dizziness, being light headed, or dysuria. Pain is almost completely gone per pt. Pt is getting ongoing therapy and will receive pain medication in relation to this, otherwise very minimal use.   Advanced Directive information Does patient have an advance directive?: Yes, Type of Advance Directive: [place];Living will Review of Systems:  Review of Systems  Constitutional: Negative for fever, chills and diaphoresis.  Respiratory: Negative for cough and shortness of breath.   Cardiovascular: Positive for leg swelling (uses TED). Negative for chest pain and palpitations.  Gastrointestinal: Negative for nausea, vomiting, abdominal pain, diarrhea and constipation.  Genitourinary: Positive for frequency (OAB). Negative for dysuria.  Musculoskeletal: Negative for myalgias.  Skin: Negative for rash.  Neurological: Negative for dizziness and headaches.  Psychiatric/Behavioral: The patient is not nervous/anxious.     Past Medical  History  Diagnosis Date  . Hypertension   . Memory loss   . GERD (gastroesophageal reflux disease)   . RLS (restless legs syndrome)   . Hypertonicity of bladder   . Renal insufficiency   . Other headache syndromes(339.89)   . Osteoporosis, unspecified   . Urinary frequency   . Accelerated hypertension 07/09/2013  . Stage III chronic kidney disease 06/17/2014  . Hip fracture requiring operative repair 06/17/2014  . Primary osteoarthritis involving multiple joints 09/24/2013  . PVD (peripheral vascular disease) 11/07/2012  . Glaucoma 11/07/2012  . Depression 11/07/2012  . Overactive bladder 10/17/2012   Past Surgical History  Procedure Laterality Date  . Appendectomy  1930  . Rotator cuff repair  1986  . Rotator cuff repair  2001  . Carpal tunnel release  2006    Bilateral   . Total hip arthroplasty Right 06/19/2014    Procedure: RIGHT HIP HEMI ANTERIOR APPROACH;  Surgeon: Rod Can, MD;  Location: [facility];  Service: Orthopedics;  Laterality: Right;   Social History:   reports that she has quit smoking. She has never used smokeless tobacco. She reports that she does not drink alcohol or use illicit drugs.  Family History  Problem Relation Age of Onset  . Heart attack Mother   . Cancer Brother   . Cancer Brother     Medications: Patient's Medications  New Prescriptions   No medications on file  Previous Medications   ALBUTEROL (PROVENTIL) (2.5 MG/3ML) 0.083% NEBULIZER SOLUTION    Take 3 mLs (2.5 mg total) by nebulization every 4 (four) hours as needed for wheezing  or shortness of breath.   AMBULATORY NON FORMULARY MEDICATION    Ted compression stockings for both legs- wear them in the morning and remove it before going to bed   AMIODARONE (PACERONE) 200 MG TABLET    Take 1 tablet (200 mg total) by mouth 2 (two) times daily.   AMLODIPINE (NORVASC) 10 MG TABLET    Take one tablet once daily to control Blood pressure   ASPIRIN 81 MG TABLET    Take 81 mg by mouth daily.   CILOSTAZOL  (PLETAL) 50 MG TABLET    Take 50 mg by mouth 2 (two) times daily.   CLONIDINE (CATAPRES) 0.1 MG TABLET    Take 1 tablet (0.1 mg total) by mouth 2 (two) times daily.   GABAPENTIN (NEURONTIN) 100 MG CAPSULE    Take 1 capsule (100 mg total) by mouth at bedtime.   HYDROCODONE-ACETAMINOPHEN (NORCO/VICODIN) 5-325 MG PER TABLET    Take 1-2 tablets by mouth every 6 (six) hours as needed for moderate pain.   LATANOPROST (XALATAN) 0.005 % OPHTHALMIC SOLUTION    Place 1 drop into both eyes daily. Instill 1 drop in each eye once daily for glaucoma.   LEVOFLOXACIN (LEVAQUIN) 750 MG TABLET    Take 1 tablet (750 mg total) by mouth once. On 06/26/14   METOPROLOL TARTRATE (LOPRESSOR) 25 MG TABLET    Take 3 tablets (75 mg total) by mouth 2 (two) times daily.   MIRABEGRON ER (MYRBETRIQ) 25 MG TB24 TABLET    Take 1 tablet (25 mg total) by mouth daily.   OMEPRAZOLE (PRILOSEC) 20 MG CAPSULE    Take 1 capsule (20 mg total) by mouth daily.   POTASSIUM CHLORIDE 20 MEQ/15ML (10%) SOLN    Take 15 mLs (20 mEq total) by mouth daily. X 4 days   SENNA-DOCUSATE (SENOKOT-S) 8.6-50 MG PER TABLET    Take 2 tablets by mouth daily as needed for mild constipation.   TIMOLOL MALEATE 0.5 % (DAILY) SOLN    Place 1 drop into both eyes daily.    TOPIRAMATE (TOPAMAX) 25 MG TABLET    Take 1 tablet (25 mg total) by mouth 2 (two) times daily.  Modified Medications   No medications on file  Discontinued Medications   No medications on file     Physical Exam:  Filed Vitals:   07/24/14 1507  BP: 138/80  Pulse: 56  Temp: 98.1 F (36.7 C)  TempSrc: Oral  SpO2: 99%    Physical Exam  Constitutional: She appears well-developed and well-nourished.  Thin white female  HENT:  Mouth/Throat: Oropharynx is clear and moist. No oropharyngeal exudate.  Neck: Normal range of motion. Neck supple.  Cardiovascular: Normal heart sounds.  An irregular rhythm present. Bradycardia present.   Pulmonary/Chest: Effort normal and breath sounds normal.    Abdominal: Soft. Bowel sounds are normal. She exhibits no distension.  Musculoskeletal: She exhibits edema (+1 bilaterally). She exhibits no tenderness.  Neurological: She is alert.  Skin: Skin is warm and dry.  Well healed right anterior hip incision   Psychiatric: She has a normal mood and affect.    Labs reviewed: Basic Metabolic Panel:  Recent Labs  06/21/14 0555 06/22/14 0607 06/23/14 0559 06/24/14 0445  NA 146* 147* 143 139  K 3.9 3.5 3.3* 3.2*  CL 115* 116* 109 103  CO2 21 23 25 26   GLUCOSE 197* 114* 97 111*  BUN 41* 36* 30* 22  CREATININE 1.05 0.85 0.95 0.93  CALCIUM 8.8 8.5 8.3* 8.5  MG 1.9  --   --   --   TSH  --  3.660  --   --    Liver Function Tests:  Recent Labs  06/17/14 0750 06/18/14 0605 06/19/14 0555  AST 362* 115* 50*  ALT 244* 157* 95*  ALKPHOS 78 58 54  BILITOT 1.4* 0.8 0.7  PROT 6.2 5.5* 5.4*  ALBUMIN 3.6 3.2* 3.2*   No results for input(s): LIPASE, AMYLASE in the last 8760 hours. No results for input(s): AMMONIA in the last 8760 hours. CBC:  Recent Labs  10/12/13 1853 06/17/14 0750  06/20/14 0522 06/21/14 0555 06/22/14 0607  WBC 5.4 22.2*  < > 8.9 15.6* 7.5  NEUTROABS 4.2 17.8*  --   --   --   --   HGB 13.3 13.9  < > 12.2 11.6* 10.3*  HCT 39.4 45.1  < > 37.9 36.4 31.9*  MCV 91.4 97.2  < > 95.2 94.1 94.9  PLT 120* 160  < > 129* 178 125*  < > = values in this interval not displayed. Lipid Panel: No results for input(s): CHOL, HDL, LDLCALC, TRIG, CHOLHDL, LDLDIRECT in the last 8760 hours. TSH:  Recent Labs  06/22/14 0607  TSH 3.660   A1C: No results found for: HGBA1C   Assessment/Plan  1. Atrial fibrillation with RVR Rate controlled, HR at 56 today, asymptomatic at this time time however  will have nursing take BP and pulse q shift for 5 days and bring reading to cardiologist, may need dose adjustment if HR remains low.   2. Essential hypertension, benign - blood pressure controlled on current regimen, will cont  current medications  3. Intertrochanteric fracture of right hip, sequela -s/p right hemiarthroplasty, has done well with therapy   4. Anemia of chronic disease - will follow up CBC with Differential/Platelet  5. Hypokalemia -replacement when she left the hospital, will follow up labs - Comprehensive metabolic panel  Follow up in 3 months, sooner if needed

## 2014-07-25 LAB — COMPREHENSIVE METABOLIC PANEL
ALBUMIN: 3.8 g/dL (ref 3.2–4.6)
ALK PHOS: 162 IU/L — AB (ref 39–117)
ALT: 20 IU/L (ref 0–32)
AST: 23 IU/L (ref 0–40)
Albumin/Globulin Ratio: 1.7 (ref 1.1–2.5)
BUN / CREAT RATIO: 20 (ref 11–26)
BUN: 21 mg/dL (ref 10–36)
Bilirubin Total: 0.2 mg/dL (ref 0.0–1.2)
CHLORIDE: 101 mmol/L (ref 97–108)
CO2: 23 mmol/L (ref 18–29)
Calcium: 9.2 mg/dL (ref 8.7–10.3)
Creatinine, Ser: 1.05 mg/dL — ABNORMAL HIGH (ref 0.57–1.00)
GFR calc non Af Amer: 46 mL/min/{1.73_m2} — ABNORMAL LOW (ref >59)
GFR, EST AFRICAN AMERICAN: 53 mL/min/{1.73_m2} — AB (ref >59)
Globulin, Total: 2.3 g/dL (ref 1.5–4.5)
Glucose: 92 mg/dL (ref 65–99)
Potassium: 5 mmol/L (ref 3.5–5.2)
SODIUM: 139 mmol/L (ref 134–144)
Total Protein: 6.1 g/dL (ref 6.0–8.5)

## 2014-07-25 LAB — CBC WITH DIFFERENTIAL/PLATELET
Basophils Absolute: 0 10*3/uL (ref 0.0–0.2)
Basos: 1 %
EOS: 2 %
Eosinophils Absolute: 0.1 10*3/uL (ref 0.0–0.4)
HCT: 37.5 % (ref 34.0–46.6)
Hemoglobin: 11.9 g/dL (ref 11.1–15.9)
Immature Grans (Abs): 0 10*3/uL (ref 0.0–0.1)
Immature Granulocytes: 0 %
LYMPHS: 24 %
Lymphocytes Absolute: 1.2 10*3/uL (ref 0.7–3.1)
MCH: 29.8 pg (ref 26.6–33.0)
MCHC: 31.7 g/dL (ref 31.5–35.7)
MCV: 94 fL (ref 79–97)
MONOCYTES: 15 %
Monocytes Absolute: 0.8 10*3/uL (ref 0.1–0.9)
NEUTROS ABS: 3 10*3/uL (ref 1.4–7.0)
Neutrophils Relative %: 58 %
Platelets: 197 10*3/uL (ref 150–379)
RBC: 4 x10E6/uL (ref 3.77–5.28)
RDW: 14 % (ref 12.3–15.4)
WBC: 5.2 10*3/uL (ref 3.4–10.8)

## 2014-07-28 NOTE — Telephone Encounter (Signed)
Patient was recently seen, Flu vaccine season has expired.

## 2014-07-29 ENCOUNTER — Ambulatory Visit: Payer: No Typology Code available for payment source | Admitting: Internal Medicine

## 2014-08-07 ENCOUNTER — Telehealth: Payer: Self-pay | Admitting: *Deleted

## 2014-08-07 NOTE — Telephone Encounter (Signed)
Received a call form a [employer] information [employer] manager) stating that the patient had a medication error regarding  Albuterol sulfate. She stated that she is not getting medication as prescribed, only getting it on a PRN bases. Spoke with [name] regarding this situation and she stated that she was to only take it every 4 hours and PRN.

## 2014-08-12 ENCOUNTER — Other Ambulatory Visit: Payer: Self-pay | Admitting: *Deleted

## 2014-08-12 MED ORDER — METOPROLOL TARTRATE 25 MG PO TABS: 25.0000 mg | ORAL_TABLET | Freq: Two times a day (BID) | ORAL | Status: DC

## 2014-08-12 NOTE — Telephone Encounter (Signed)
Received call from [facility] PT regarding patient's pulse rate of 44. Per [name] decrease Metoprolol to 25mg  twice daily and check vital signs every shift and bring them to appointment on 08/18/14 @ 2:15. Faxed new order to [employer]) at [place] Fax: [phone].

## 2014-08-15 ENCOUNTER — Telehealth: Payer: Self-pay

## 2014-08-15 NOTE — Telephone Encounter (Signed)
Home Health called to discuss low pulse of 52.  I informed [facility], patient is scheduled to see NP on Monday. I will call [name] to discuss low pulse and get recommendations.   I called [name], standing order was given to HOLD metoprolol if pulse lower than 60. [name] asked that I confirm that this order is being followed.  I called patients son and he will verify with Brooke[facility]Dale that they are following standing order.  Patient to bring log of when medication was held and pulse low.

## 2014-08-18 ENCOUNTER — Ambulatory Visit (INDEPENDENT_AMBULATORY_CARE_PROVIDER_SITE_OTHER): Payer: Medicare Other | Admitting: Nurse Practitioner

## 2014-08-18 ENCOUNTER — Encounter: Payer: Self-pay | Admitting: Nurse Practitioner

## 2014-08-18 VITALS — BP 138/76 | HR 52 | Temp 98.0°F | Resp 12

## 2014-08-18 DIAGNOSIS — R1310 Dysphagia, unspecified: Secondary | ICD-10-CM

## 2014-08-18 DIAGNOSIS — R0602 Shortness of breath: Secondary | ICD-10-CM

## 2014-08-18 DIAGNOSIS — R001 Bradycardia, unspecified: Secondary | ICD-10-CM | POA: Diagnosis not present

## 2014-08-18 MED ORDER — METOPROLOL TARTRATE 25 MG PO TABS: 12.5000 mg | ORAL_TABLET | Freq: Two times a day (BID) | ORAL | Status: DC

## 2014-08-18 NOTE — Progress Notes (Signed)
Patient ID: Carrie Moran, female   DOB: 04/07/1917, 82 y.o.   MRN: [id number]    PCP: Lauree Chandler, NP  Allergies  Allergen Reactions  . Sulfa Antibiotics     Unknown reaction.     Chief Complaint  Patient presents with  . Follow-up    1 month follow-up on labs- abnormal kindey and potassium function(s)   . Bradycardia    Discuss low pulse and breathing issues      HPI: Patient is a 81 y.o. female seen in the office today to follow up blood work from last month. Also increased shortness of breath and low pulse.   Pt reports increased shortness of breath after she eats. Gets better after several hours. Reports difficulty swallowing.  No increase in shortness of breath with walking. -- walking minimally after right hip surgery after fracture   Denies  Being light headed or being dizziness  Has not increased water/fluid intake.   Review of Systems:  Review of Systems  Constitutional: Negative for fever, chills and diaphoresis.  HENT:       Reports increased saliva and trouble swallowing   Respiratory: Positive for shortness of breath (appears random, no increase shortness of breath with exertion or laying down). Negative for cough and wheezing.   Cardiovascular: Positive for leg swelling (uses TED). Negative for chest pain and palpitations.  Gastrointestinal: Negative for nausea, vomiting, abdominal pain, diarrhea and constipation.  Genitourinary: Positive for frequency (OAB). Negative for dysuria.  Musculoskeletal: Negative for myalgias.  Skin: Negative for rash.  Neurological: Negative for dizziness and headaches.  Psychiatric/Behavioral: Positive for confusion (memory issues). The patient is not nervous/anxious.     Past Medical History  Diagnosis Date  . Hypertension   . Memory loss   . GERD (gastroesophageal reflux disease)   . RLS (restless legs syndrome)   . Hypertonicity of bladder   . Renal insufficiency   . Other headache syndromes(339.89)   .  Osteoporosis, unspecified   . Urinary frequency   . Accelerated hypertension 07/09/2013  . Stage III chronic kidney disease 06/17/2014  . Hip fracture requiring operative repair 06/17/2014  . Primary osteoarthritis involving multiple joints 09/24/2013  . PVD (peripheral vascular disease) 11/07/2012  . Glaucoma 11/07/2012  . Depression 11/07/2012  . Overactive bladder 10/17/2012   Past Surgical History  Procedure Laterality Date  . Appendectomy  1930  . Rotator cuff repair  1986  . Rotator cuff repair  2001  . Carpal tunnel release  2006    Bilateral   . Total hip arthroplasty Right 06/19/2014    Procedure: RIGHT HIP HEMI ANTERIOR APPROACH;  Surgeon: Rod Can, MD;  Location: [facility];  Service: Orthopedics;  Laterality: Right;   Social History:   reports that she has quit smoking. She has never used smokeless tobacco. She reports that she does not drink alcohol or use illicit drugs.  Family History  Problem Relation Age of Onset  . Heart attack Mother   . Cancer Brother   . Cancer Brother     Medications: Patient's Medications  New Prescriptions   No medications on file  Previous Medications   ALBUTEROL (PROVENTIL) (2.5 MG/3ML) 0.083% NEBULIZER SOLUTION    Take 3 mLs (2.5 mg total) by nebulization every 4 (four) hours as needed for wheezing or shortness of breath.   AMBULATORY NON FORMULARY MEDICATION    Ted compression stockings for both legs- wear them in the morning and remove it before going to bed   AMIODARONE (  PACERONE) 200 MG TABLET    Take 1 tablet (200 mg total) by mouth 2 (two) times daily.   AMLODIPINE (NORVASC) 10 MG TABLET    Take one tablet once daily to control Blood pressure   ASPIRIN 81 MG TABLET    Take 81 mg by mouth daily.   CILOSTAZOL (PLETAL) 50 MG TABLET    Take 50 mg by mouth 2 (two) times daily.   CLONIDINE (CATAPRES) 0.1 MG TABLET    Take 1 tablet (0.1 mg total) by mouth 2 (two) times daily.   GABAPENTIN (NEURONTIN) 100 MG CAPSULE    Take 1 capsule (100 mg  total) by mouth at bedtime.   HYDROCODONE-ACETAMINOPHEN (NORCO/VICODIN) 5-325 MG PER TABLET    Take 1-2 tablets by mouth every 6 (six) hours as needed for moderate pain.   LATANOPROST (XALATAN) 0.005 % OPHTHALMIC SOLUTION    Place 1 drop into both eyes daily. Instill 1 drop in each eye once daily for glaucoma.   METOPROLOL TARTRATE (LOPRESSOR) 25 MG TABLET    Take 1 tablet (25 mg total) by mouth 2 (two) times daily.   MIRABEGRON ER (MYRBETRIQ) 25 MG TB24 TABLET    Take 1 tablet (25 mg total) by mouth daily.   OMEPRAZOLE (PRILOSEC) 20 MG CAPSULE    Take 1 capsule (20 mg total) by mouth daily.   SENNA-DOCUSATE (SENOKOT-S) 8.6-50 MG PER TABLET    Take 2 tablets by mouth daily as needed for mild constipation.   TIMOLOL MALEATE 0.5 % (DAILY) SOLN    Place 1 drop into both eyes daily.    TOPIRAMATE (TOPAMAX) 25 MG TABLET    Take 1 tablet (25 mg total) by mouth 2 (two) times daily.  Modified Medications   No medications on file  Discontinued Medications   No medications on file     Physical Exam:  Filed Vitals:   08/18/14 1401  BP: 138/76  Pulse: 52  Temp: 98 F (36.7 C)  TempSrc: Oral  Resp: 12  SpO2: 96%    Physical Exam  Constitutional: She appears well-developed and well-nourished.  Thin white female  HENT:  Mouth/Throat: Oropharynx is clear and moist. No oropharyngeal exudate.  Neck: Normal range of motion. Neck supple.  Cardiovascular: Regular rhythm and normal heart sounds.  Bradycardia present.   Pulmonary/Chest: Effort normal and breath sounds normal.  Abdominal: Soft. Bowel sounds are normal. She exhibits no distension.  Musculoskeletal: She exhibits edema (+1 bilaterally). She exhibits no tenderness.  Neurological: She is alert.  Skin: Skin is warm and dry.  Psychiatric: She has a normal mood and affect.    Labs reviewed: Basic Metabolic Panel:  Recent Labs  06/21/14 0555 06/22/14 0607 06/23/14 0559 06/24/14 0445 07/24/14 1603  NA 146* 147* 143 139 139  K  3.9 3.5 3.3* 3.2* 5.0  CL 115* 116* 109 103 101  CO2 21 23 25 26 23   GLUCOSE 197* 114* 97 111* 92  BUN 41* 36* 30* 22 21  CREATININE 1.05 0.85 0.95 0.93 1.05*  CALCIUM 8.8 8.5 8.3* 8.5 9.2  MG 1.9  --   --   --   --   TSH  --  3.660  --   --   --    Liver Function Tests:  Recent Labs  06/17/14 0750 06/18/14 0605 06/19/14 0555 07/24/14 1603  AST 362* 115* 50* 23  ALT 244* 157* 95* 20  ALKPHOS 78 58 54 162*  BILITOT 1.4* 0.8 0.7 0.2  PROT 6.2 5.5* 5.4*  6.1  ALBUMIN 3.6 3.2* 3.2*  --    No results for input(s): LIPASE, AMYLASE in the last 8760 hours. No results for input(s): AMMONIA in the last 8760 hours. CBC:  Recent Labs  10/12/13 1853 06/17/14 0750  06/21/14 0555 06/22/14 0607 07/24/14 1603  WBC 5.4 22.2*  < > 15.6* 7.5 5.2  NEUTROABS 4.2 17.8*  --   --   --  3.0  HGB 13.3 13.9  < > 11.6* 10.3* 11.9  HCT 39.4 45.1  < > 36.4 31.9* 37.5  MCV 91.4 97.2  < > 94.1 94.9 94  PLT 120* 160  < > 178 125* 197  < > = values in this interval not displayed. Lipid Panel: No results for input(s): CHOL, HDL, LDLCALC, TRIG, CHOLHDL, LDLDIRECT in the last 8760 hours. TSH:  Recent Labs  06/22/14 0607  TSH 3.660   A1C: No results found for: HGBA1C   Assessment/Plan  1. Trouble swallowing -will get ST to evaluate and treat, question if some aspiration occuring with causes increase in shortness of breath  2. Shortness of breath -reports shortness of breath after eating? Reports neb treatment helps just a little bit but normal resolves on its own.  -will get follow up chest xray  3. Bradycardia -EKG showing bradycardia, no ectopic beats during EKG, otherwise consistent with previous - Basic metabolic panel to follow up electrolytes and kidney function -decrease metoprolol to 12.5 mg BID to hold for HR < 60.  -keep follow up with cardiology

## 2014-08-18 NOTE — Patient Instructions (Signed)
1) Reduce metoprolol to 12.5 mg PO daily 2) repeat chest xray at facility  3) Speech Therapy to evaluate and treat--- increase trouble swallowing (reports increase saliva and worsening shortness of breath)

## 2014-08-19 ENCOUNTER — Other Ambulatory Visit: Payer: Self-pay | Admitting: *Deleted

## 2014-08-19 LAB — BASIC METABOLIC PANEL
BUN/Creatinine Ratio: 20 (ref 11–26)
BUN: 22 mg/dL (ref 10–36)
CO2: 21 mmol/L (ref 18–29)
Calcium: 9.5 mg/dL (ref 8.7–10.3)
Chloride: 103 mmol/L (ref 97–108)
Creatinine, Ser: 1.12 mg/dL — ABNORMAL HIGH (ref 0.57–1.00)
GFR, EST AFRICAN AMERICAN: 49 mL/min/{1.73_m2} — AB (ref >59)
GFR, EST NON AFRICAN AMERICAN: 42 mL/min/{1.73_m2} — AB (ref >59)
Glucose: 75 mg/dL (ref 65–99)
POTASSIUM: 3.9 mmol/L (ref 3.5–5.2)
SODIUM: 141 mmol/L (ref 134–144)

## 2014-08-19 MED ORDER — AMBULATORY NON FORMULARY MEDICATION: Status: AC

## 2014-08-19 NOTE — Telephone Encounter (Signed)
Received fax from [facility] #[phone] requesting written order for 3 in1 commode to prevent pain during toileting task and increase patient safety, independence and functional performance during toileting transfer. Printed order and given to Sherrie Mustache, NP to review and sign. To be faxed back to Fax#: [phone]

## 2014-08-21 ENCOUNTER — Telehealth: Payer: Self-pay | Admitting: *Deleted

## 2014-08-21 NOTE — Telephone Encounter (Signed)
Stop metoprolol, make sure she follows up with cardiology ASAP

## 2014-08-21 NOTE — Telephone Encounter (Signed)
Cassandra with [facility] called with heart Rate for patient between 51-52. Will be faxing the assessment to our office.

## 2014-08-22 NOTE — Telephone Encounter (Signed)
Spoke with Levada Dy #: [phone] at [facility] and faxed order to Fax#: [phone] for order changes.

## 2014-09-03 ENCOUNTER — Ambulatory Visit (INDEPENDENT_AMBULATORY_CARE_PROVIDER_SITE_OTHER): Payer: Medicare Other | Admitting: Internal Medicine

## 2014-09-03 ENCOUNTER — Encounter: Payer: Self-pay | Admitting: Internal Medicine

## 2014-09-03 VITALS — BP 136/78 | HR 55 | Ht 61.0 in | Wt 98.9 lb

## 2014-09-03 DIAGNOSIS — Z79899 Other long term (current) drug therapy: Secondary | ICD-10-CM | POA: Diagnosis not present

## 2014-09-03 DIAGNOSIS — S72001S Fracture of unspecified part of neck of right femur, sequela: Secondary | ICD-10-CM

## 2014-09-03 DIAGNOSIS — I4891 Unspecified atrial fibrillation: Secondary | ICD-10-CM | POA: Diagnosis not present

## 2014-09-03 DIAGNOSIS — R7989 Other specified abnormal findings of blood chemistry: Secondary | ICD-10-CM

## 2014-09-03 DIAGNOSIS — R778 Other specified abnormalities of plasma proteins: Secondary | ICD-10-CM

## 2014-09-03 NOTE — Patient Instructions (Signed)
Medication Instructions:   DECREASE AMIODARONE TO 200MG  ONCE DAILY  Labwork:  TSH/CMET - PLEASE SEND RESULTS TO DR. HILTY - FAX [phone]   Follow-Up:  1 YEAR WITH DR. HILTY

## 2014-09-03 NOTE — Progress Notes (Signed)
OFFICE NOTE  Chief Complaint:  Hospital follow-up  Primary Care Physician: Lauree Chandler, NP  HPI:  Carrie Moran is a 81 y.o. female with a past medical history significant for hypertension and severe LVH with an EF of 65% by echocardiography in 2011. She also has senile dementia. She presented with a fall and right hip pain. She completely denies any angina. This is apparently a mechanical fall. 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 She denies any syncope or loss of consciousness. X-rays show a right subcapital hip fracture. As part of her workup her troponin resulted as positive at 0.49. White blood cell count is elevated at 22,000 with a left shift. UA is negative and chest x-ray is clear. There is no known history of coronary disease. Cardiology is asked to comment on her elevated troponin and preoperative risk. I felt that she was at intermediate risk for surgery however delay was not acceptable. While she may been increased risk for coronary disease, she did get to surgery without any difficulty. She is now at a long-term care facility and is still having difficulty ambulating. There is concerns for falls. She was started on aspirin for this reason. Warfarin or other anticoagulant may be too risky to use in this situation. She is noted to be in sinus bradycardia today and is on amiodarone 200 mg twice daily.  PMHx:  Past Medical History  Diagnosis Date  . Hypertension   . Memory loss   . GERD (gastroesophageal reflux disease)   . RLS (restless legs syndrome)   . Hypertonicity of bladder   . Renal insufficiency   . Other headache syndromes(339.89)   . Osteoporosis, unspecified   . Urinary frequency   . Accelerated hypertension 07/09/2013  . Stage III chronic kidney disease 06/17/2014  . Hip fracture requiring operative repair 06/17/2014  . Primary osteoarthritis involving multiple joints 09/24/2013  . PVD  (peripheral vascular disease) 11/07/2012  . Glaucoma 11/07/2012  . Depression 11/07/2012  . Overactive bladder 10/17/2012    Past Surgical History  Procedure Laterality Date  . Appendectomy  1930  . Rotator cuff repair  1986  . Rotator cuff repair  2001  . Carpal tunnel release  2006    Bilateral   . Total hip arthroplasty Right 06/19/2014    Procedure: RIGHT HIP HEMI ANTERIOR APPROACH;  Surgeon: Rod Can, MD;  Location: [facility];  Service: Orthopedics;  Laterality: Right;    FAMHx:  Family History  Problem Relation Age of Onset  . Heart attack Mother   . Cancer Brother   . Cancer Brother     SOCHx:   reports that she has quit smoking. She has never used smokeless tobacco. She reports that she does not drink alcohol or use illicit drugs.  ALLERGIES:  Allergies  Allergen Reactions  . Sulfa Antibiotics     Unknown reaction.     ROS: A comprehensive review of systems was negative.  HOME MEDS: Current Outpatient Prescriptions  Medication Sig Dispense Refill  . albuterol (PROVENTIL) (2.5 MG/3ML) 0.083% nebulizer solution Take 3 mLs (2.5 mg total) by nebulization every 4 (four) hours as needed for wheezing or shortness of breath. 75 mL 1  . AMBULATORY NON FORMULARY MEDICATION Ted compression stockings for both legs- wear them in the morning and remove it before going to bed    . AMBULATORY NON FORMULARY MEDICATION 3-in-1 Commode To prevent pain during toileting task and increase  patient safety 1 each 0  . amiodarone (PACERONE) 200 MG tablet Take 200 mg by mouth daily.    Marland Kitchen amLODipine (NORVASC) 10 MG tablet Take one tablet once daily to control Blood pressure    . aspirin 81 MG tablet Take 81 mg by mouth daily.    . cilostazol (PLETAL) 50 MG tablet Take 50 mg by mouth 2 (two) times daily.    . cloNIDine (CATAPRES) 0.1 MG tablet Take 1 tablet (0.1 mg total) by mouth 2 (two) times daily. 60 tablet 3  . gabapentin (NEURONTIN) 100 MG capsule Take 1 capsule (100 mg total) by mouth  at bedtime. 30 capsule 3  . HYDROcodone-acetaminophen (NORCO/VICODIN) 5-325 MG per tablet Take 1-2 tablets by mouth every 6 (six) hours as needed for moderate pain. 30 tablet 0  . latanoprost (XALATAN) 0.005 % ophthalmic solution Place 1 drop into both eyes daily. Instill 1 drop in each eye once daily for glaucoma.    . metoprolol tartrate (LOPRESSOR) 25 MG tablet Take 12.5 mg by mouth 2 (two) times daily. HOLD if HR under 60    . mirabegron ER (MYRBETRIQ) 25 MG TB24 tablet Take 1 tablet (25 mg total) by mouth daily. 30 tablet 3  . Multiple Vitamins-Minerals (DECUBI-VITE) CAPS Take by mouth daily.    Marland Kitchen omeprazole (PRILOSEC) 20 MG capsule Take 1 capsule (20 mg total) by mouth daily. 30 capsule 3  . senna-docusate (SENOKOT-S) 8.6-50 MG per tablet Take 2 tablets by mouth daily as needed for mild constipation.    . Timolol Maleate 0.5 % (DAILY) SOLN Place 1 drop into both eyes daily.     Marland Kitchen topiramate (TOPAMAX) 25 MG tablet Take 1 tablet (25 mg total) by mouth 2 (two) times daily. 60 tablet 1   No current facility-administered medications for this visit.    LABS/IMAGING: No results found for this or any previous visit (from the past 48 hour(s)). No results found.  WEIGHTS: Wt Readings from Last 3 Encounters:  09/03/14 98 lb 14.4 oz (44.861 kg)  06/17/14 106 lb 0.7 oz (48.1 kg)  06/11/14 104 lb 9.6 oz (47.446 kg)    VITALS: BP 136/78 mmHg  Pulse 55  Ht 5\' 1"  (1.549 m)  Wt 98 lb 14.4 oz (44.861 kg)  BMI 18.70 kg/m2  EXAM: General appearance: alert and no distress Neck: no carotid bruit and no JVD Lungs: clear to auscultation bilaterally Heart: regular rate and rhythm, S1, S2 normal, no murmur, click, rub or gallop Abdomen: soft, non-tender; bowel sounds normal; no masses,  no organomegaly Extremities: extremities normal, atraumatic, no cyanosis or edema Pulses: 2+ and symmetric Skin: Skin color, texture, turgor normal. No rashes or lesions Neurologic: Mental status: Oriented to  self, place Psych: Pleasant mood, flat affect  EKG: Sinus bradycardia 55  ASSESSMENT: 1. Paroxysmal atrial fibrillation-maintaining sinus bradycardia on amiodarone 2. Recent hip fracture status post repair 3. Hypertension-controlled 4. CHADSVASC 5-on aspirin due to high fall risk and dementia  PLAN: 1.   Carrie Moran is doing well without recurrent atrial fibrillation. She is maintaining sinus rhythm on amiodarone. I believe we can decrease that dose to 200 mg daily today. We will need to check a TSH and CMET. Follow-up annually or sooner as necessary.  Pixie Casino, MD, [facility] Attending Cardiologist [place] 09/03/2014, 2:36 PM

## 2014-09-04 NOTE — Telephone Encounter (Signed)
Opened in error

## 2014-09-08 DIAGNOSIS — I129 Hypertensive chronic kidney disease with stage 1 through stage 4 chronic kidney disease, or unspecified chronic kidney disease: Secondary | ICD-10-CM

## 2014-09-08 DIAGNOSIS — Z96641 Presence of right artificial hip joint: Secondary | ICD-10-CM

## 2014-09-08 DIAGNOSIS — Z471 Aftercare following joint replacement surgery: Secondary | ICD-10-CM

## 2014-09-08 DIAGNOSIS — I4891 Unspecified atrial fibrillation: Secondary | ICD-10-CM

## 2014-09-16 ENCOUNTER — Telehealth: Payer: Self-pay | Admitting: *Deleted

## 2014-09-16 DIAGNOSIS — M25569 Pain in unspecified knee: Secondary | ICD-10-CM

## 2014-09-16 NOTE — Telephone Encounter (Signed)
Received a fax request from [facility] [phone] stating patient complains of right knee pain; can she has alternative pain medication so she can tolerate gait training and if the prescription can be faxed to [facility] at Fax #: [phone]. Tried calling the facility and number rings busy. Will try again. Spoke with Amieta and she stated to prescribe medication and fax Rx to the fax number and she will be sure to give it to the med tech.  Sent to [employer]

## 2014-09-17 ENCOUNTER — Other Ambulatory Visit: Payer: Self-pay | Admitting: *Deleted

## 2014-09-17 DIAGNOSIS — M25569 Pain in unspecified knee: Secondary | ICD-10-CM

## 2014-09-17 MED ORDER — TRAMADOL HCL 50 MG PO TABS: 50.0000 mg | ORAL_TABLET | Freq: Three times a day (TID) | ORAL | Status: DC | PRN

## 2014-09-17 MED ORDER — TRAMADOL HCL 50 MG PO TABS
50.0000 mg | ORAL_TABLET | Freq: Three times a day (TID) | ORAL | Status: DC | PRN
Start: 2014-09-17 — End: 2015-01-14

## 2014-09-17 NOTE — Telephone Encounter (Signed)
May use ultram 50 mg every 8 hours as needed for pain, Rx entered into medication list

## 2014-09-17 NOTE — Telephone Encounter (Signed)
Rx printed and Dr. Eulas Post signed and faxed to [facility].

## 2014-09-24 ENCOUNTER — Encounter (HOSPITAL_COMMUNITY): Payer: Self-pay

## 2014-09-24 ENCOUNTER — Emergency Department (HOSPITAL_COMMUNITY)
Admission: EM | Admit: 2014-09-24 | Discharge: 2014-09-24 | Disposition: A | Discharge status: Home / Self Care | Payer: Medicare Other | Attending: Emergency Medicine | Admitting: Emergency Medicine

## 2014-09-24 DIAGNOSIS — N183 Chronic kidney disease, stage 3 (moderate): Secondary | ICD-10-CM | POA: Diagnosis not present

## 2014-09-24 DIAGNOSIS — M150 Primary generalized (osteo)arthritis: Secondary | ICD-10-CM | POA: Insufficient documentation

## 2014-09-24 DIAGNOSIS — K5900 Constipation, unspecified: Secondary | ICD-10-CM | POA: Diagnosis not present

## 2014-09-24 DIAGNOSIS — R531 Weakness: Secondary | ICD-10-CM | POA: Diagnosis not present

## 2014-09-24 DIAGNOSIS — G2581 Restless legs syndrome: Secondary | ICD-10-CM | POA: Diagnosis not present

## 2014-09-24 DIAGNOSIS — Z87891 Personal history of nicotine dependence: Secondary | ICD-10-CM | POA: Diagnosis not present

## 2014-09-24 DIAGNOSIS — L89150 Pressure ulcer of sacral region, unstageable: Secondary | ICD-10-CM | POA: Insufficient documentation

## 2014-09-24 DIAGNOSIS — Z8781 Personal history of (healed) traumatic fracture: Secondary | ICD-10-CM | POA: Insufficient documentation

## 2014-09-24 DIAGNOSIS — F329 Major depressive disorder, single episode, unspecified: Secondary | ICD-10-CM | POA: Insufficient documentation

## 2014-09-24 DIAGNOSIS — R51 Headache: Secondary | ICD-10-CM | POA: Insufficient documentation

## 2014-09-24 DIAGNOSIS — Z7982 Long term (current) use of aspirin: Secondary | ICD-10-CM | POA: Insufficient documentation

## 2014-09-24 DIAGNOSIS — K219 Gastro-esophageal reflux disease without esophagitis: Secondary | ICD-10-CM | POA: Diagnosis not present

## 2014-09-24 DIAGNOSIS — Z79899 Other long term (current) drug therapy: Secondary | ICD-10-CM | POA: Diagnosis not present

## 2014-09-24 DIAGNOSIS — R011 Cardiac murmur, unspecified: Secondary | ICD-10-CM | POA: Insufficient documentation

## 2014-09-24 DIAGNOSIS — H409 Unspecified glaucoma: Secondary | ICD-10-CM | POA: Insufficient documentation

## 2014-09-24 DIAGNOSIS — I129 Hypertensive chronic kidney disease with stage 1 through stage 4 chronic kidney disease, or unspecified chronic kidney disease: Secondary | ICD-10-CM | POA: Insufficient documentation

## 2014-09-24 DIAGNOSIS — R52 Pain, unspecified: Secondary | ICD-10-CM | POA: Diagnosis present

## 2014-09-24 LAB — URINALYSIS, ROUTINE W REFLEX MICROSCOPIC
Bilirubin Urine: NEGATIVE
GLUCOSE, UA: NEGATIVE mg/dL
Hgb urine dipstick: NEGATIVE
Ketones, ur: NEGATIVE mg/dL
Leukocytes, UA: NEGATIVE
Nitrite: NEGATIVE
PH: 7 (ref 5.0–8.0)
Protein, ur: 30 mg/dL — AB
SPECIFIC GRAVITY, URINE: 1.012 (ref 1.005–1.030)
Urobilinogen, UA: 1 mg/dL (ref 0.0–1.0)

## 2014-09-24 LAB — BASIC METABOLIC PANEL
ANION GAP: 10 (ref 5–15)
BUN: 27 mg/dL — ABNORMAL HIGH (ref 6–20)
CHLORIDE: 107 mmol/L (ref 101–111)
CO2: 22 mmol/L (ref 22–32)
CREATININE: 1.22 mg/dL — AB (ref 0.44–1.00)
Calcium: 9.2 mg/dL (ref 8.9–10.3)
GFR calc Af Amer: 43 mL/min — ABNORMAL LOW (ref >60)
GFR, EST NON AFRICAN AMERICAN: 37 mL/min — AB (ref >60)
GLUCOSE: 99 mg/dL (ref 65–99)
Potassium: 3.7 mmol/L (ref 3.5–5.1)
Sodium: 139 mmol/L (ref 135–145)

## 2014-09-24 LAB — CBC WITH DIFFERENTIAL/PLATELET
BASOS ABS: 0 10*3/uL (ref 0.0–0.1)
Basophils Relative: 0 % (ref 0–1)
EOS ABS: 0.3 10*3/uL (ref 0.0–0.7)
EOS PCT: 5 % (ref 0–5)
HCT: 41.7 % (ref 36.0–46.0)
Hemoglobin: 13.7 g/dL (ref 12.0–15.0)
Lymphocytes Relative: 24 % (ref 12–46)
Lymphs Abs: 1.7 10*3/uL (ref 0.7–4.0)
MCH: 30 pg (ref 26.0–34.0)
MCHC: 32.9 g/dL (ref 30.0–36.0)
MCV: 91.4 fL (ref 78.0–100.0)
Monocytes Absolute: 0.6 10*3/uL (ref 0.1–1.0)
Monocytes Relative: 9 % (ref 3–12)
Neutro Abs: 4.5 10*3/uL (ref 1.7–7.7)
Neutrophils Relative %: 62 % (ref 43–77)
Platelets: 184 10*3/uL (ref 150–400)
RBC: 4.56 MIL/uL (ref 3.87–5.11)
RDW: 13.7 % (ref 11.5–15.5)
WBC: 7.2 10*3/uL (ref 4.0–10.5)

## 2014-09-24 LAB — URINE MICROSCOPIC-ADD ON

## 2014-09-24 LAB — I-STAT TROPONIN, ED: Troponin i, poc: 0 ng/mL (ref 0.00–0.08)

## 2014-09-24 NOTE — Discharge Instructions (Signed)
We saw you in the ER for weakness. All the results in the ER are normal. We are not sure what is causing your symptoms. The workup in the ER is not complete, and is limited to screening for life threatening and emergent conditions only, so please see a primary care doctor for further evaluation.  PLEASE NOTE -you have a pressure ulcer in your back. Ensure that you are being turned promptly, otherwise you will end up wit   Please return to the ER if your symptoms worsen; you have increased pain, fevers, chills, inability to keep any medications down, confusion. Otherwise see the outpatient doctor as requested.  Constipation Constipation is when a person has fewer than three bowel movements a week, has difficulty having a bowel movement, or has stools that are dry, hard, or larger than normal. As people grow older, constipation is more common. If you try to fix constipation with medicines that make you have a bowel movement (laxatives), the problem may get worse. Long-term laxative use may cause the muscles of the colon to become weak. A low-fiber diet, not taking in enough fluids, and taking certain medicines may make constipation worse.  CAUSES   Certain medicines, such as antidepressants, pain medicine, iron supplements, antacids, and water pills.   Certain diseases, such as diabetes, irritable bowel syndrome (IBS), thyroid disease, or depression.   Not drinking enough water.   Not eating enough fiber-rich foods.   Stress or travel.   Lack of physical activity or exercise.   Ignoring the urge to have a bowel movement.   Using laxatives too much.  SIGNS AND SYMPTOMS   Having fewer than three bowel movements a week.   Straining to have a bowel movement.   Having stools that are hard, dry, or larger than normal.   Feeling full or bloated.   Pain in the lower abdomen.   Not feeling relief after having a bowel movement.  DIAGNOSIS  Your health care provider will  take a medical history and perform a physical exam. Further testing may be done for severe constipation. Some tests may include:  A barium enema X-ray to examine your rectum, colon, and, sometimes, your small intestine.   A sigmoidoscopy to examine your lower colon.   A colonoscopy to examine your entire colon. TREATMENT  Treatment will depend on the severity of your constipation and what is causing it. Some dietary treatments include drinking more fluids and eating more fiber-rich foods. Lifestyle treatments may include regular exercise. If these diet and lifestyle recommendations do not help, your health care provider may recommend taking over-the-counter laxative medicines to help you have bowel movements. Prescription medicines may be prescribed if over-the-counter medicines do not work.  HOME CARE INSTRUCTIONS   Eat foods that have a lot of fiber, such as fruits, vegetables, whole grains, and beans.  Limit foods high in fat and processed sugars, such as french fries, hamburgers, cookies, candies, and soda.   A fiber supplement may be added to your diet if you cannot get enough fiber from foods.   Drink enough fluids to keep your urine clear or pale yellow.   Exercise regularly or as directed by your health care provider.   Go to the restroom when you have the urge to go. Do not hold it.   Only take over-the-counter or prescription medicines as directed by your health care provider. Do not take other medicines for constipation without talking to your health care provider first.  [place]  IF:   You have bright red blood in your stool.   Your constipation lasts for more than 4 days or gets worse.   You have abdominal or rectal pain.   You have thin, pencil-like stools.   You have unexplained weight loss. MAKE SURE YOU:   Understand these instructions.  Will watch your condition.  Will get help right away if you are not doing well or get  worse. Document Released: 12/18/2003 Document Revised: 03/26/2013 Document Reviewed: 12/31/2012 [facility] Patient Information 2015 [place], [place]. This information is not intended to replace advice given to you by your health care provider. Make sure you discuss any questions you have with your health care provider.

## 2014-09-24 NOTE — ED Notes (Signed)
Pt c/o generalized body pain and HA.  Pt has had HA since 0600 this morning and reports that the pain improved after Tramadol but HA is still present.  Pt sts "The pain moves and I just don't feel right.  I can't explain how I feel."  Pt denies any abdominal pain, n/v/d or chest pain.

## 2014-09-24 NOTE — ED Provider Notes (Signed)
CSN: [id number]     Arrival date & time 09/24/14  4259 History   First MD Initiated Contact with Patient 09/24/14 [phone]     Chief Complaint  Patient presents with  . generalized pain      (Consider location/radiation/quality/duration/timing/severity/associated sxs/prior Treatment) HPI Comments: Patient is a 81 year old female with hx of HTN, LVH, PVD, glaucoma, depression, senile dementia,  stage III CKD,  who presented from a nursing facility due to complain of HA and generalized malaise. Patient had severe headache at nursing facility and was given tramadol, which provided relief. History is limited by dementia. Upon interviewing patient she did not endorse any specific pain. No nausea, vomiting, visual complains, seizures, loss of consciousness, or numbness, no gait instability. No new fall. She stated that she "feels bad all over." When asked to elaborate she states that she has not felt like doing any activities since yesterday and just wants to feel better. She does note decreased urinary volume, increased frequency, and change in appearance. She also describes an increase in SOB.  She denies current chest pain, subjective fever, chills, nausea, vomiting, and diarrhea.  She has maintained an adequate appetite and fluid intake.  The history is provided by the patient.    Past Medical History  Diagnosis Date  . Hypertension   . Memory loss   . GERD (gastroesophageal reflux disease)   . RLS (restless legs syndrome)   . Hypertonicity of bladder   . Renal insufficiency   . Other headache syndromes(339.89)   . Osteoporosis, unspecified   . Urinary frequency   . Accelerated hypertension 07/09/2013  . Stage III chronic kidney disease 06/17/2014  . Hip fracture requiring operative repair 06/17/2014  . Primary osteoarthritis involving multiple joints 09/24/2013  . PVD (peripheral vascular disease) 11/07/2012  . Glaucoma 11/07/2012  . Depression 11/07/2012  . Overactive bladder 10/17/2012   Past  Surgical History  Procedure Laterality Date  . Appendectomy  1930  . Rotator cuff repair  1986  . Rotator cuff repair  2001  . Carpal tunnel release  2006    Bilateral   . Total hip arthroplasty Right 06/19/2014    Procedure: RIGHT HIP HEMI ANTERIOR APPROACH;  Surgeon: Rod Can, MD;  Location: [facility];  Service: Orthopedics;  Laterality: Right;   Family History  Problem Relation Age of Onset  . Heart attack Mother   . Cancer Brother   . Cancer Brother    History  Substance Use Topics  . Smoking status: Former Research scientist (life sciences)  . Smokeless tobacco: Never Used  . Alcohol Use: No   OB History    No data available     Review of Systems  Constitutional: Positive for activity change and fatigue.  Respiratory: Negative for shortness of breath.   Cardiovascular: Negative for chest pain.  Gastrointestinal: Positive for constipation. Negative for nausea, vomiting and abdominal pain.  Genitourinary: Negative for dysuria.  Musculoskeletal: Negative for neck pain.  Neurological: Positive for headaches.  All other systems reviewed and are negative.     Allergies  Sulfa antibiotics  Home Medications   Prior to Admission medications   Medication Sig Start Date End Date Taking? Authorizing Provider  acetaminophen (TYLENOL) 500 MG tablet Take 1,000 mg by mouth every 6 (six) hours as needed for mild pain.   Yes Historical Provider, MD  albuterol (PROVENTIL) (2.5 MG/3ML) 0.083% nebulizer solution Take 3 mLs (2.5 mg total) by nebulization every 4 (four) hours as needed for wheezing or shortness of breath. 06/22/14  Yes Orson Eva, MD  AMBULATORY NON FORMULARY MEDICATION Ted compression stockings for both legs- wear them in the morning and remove it before going to bed   Yes Historical Provider, MD  AMBULATORY NON FORMULARY MEDICATION 3-in-1 Commode To prevent pain during toileting task and increase patient safety 08/19/14  Yes Lauree Chandler, NP  amiodarone (PACERONE) 200 MG tablet Take 200  mg by mouth daily.   Yes Historical Provider, MD  amLODipine (NORVASC) 10 MG tablet Take one tablet once daily to control Blood pressure   Yes Historical Provider, MD  aspirin 81 MG tablet Take 81 mg by mouth daily.   Yes Historical Provider, MD  cilostazol (PLETAL) 50 MG tablet Take 50 mg by mouth 2 (two) times daily.   Yes Historical Provider, MD  cloNIDine (CATAPRES) 0.1 MG tablet Take 1 tablet (0.1 mg total) by mouth 2 (two) times daily. 10/24/12  Yes Mahima Bubba Camp, MD  gabapentin (NEURONTIN) 100 MG capsule Take 1 capsule (100 mg total) by mouth at bedtime. 06/06/14  Yes Gildardo Cranker, DO  geriatric multivitamins-minerals (ELDERTONIC/GEVRABON) ELIX Take 15 mLs by mouth 3 (three) times daily before meals.   Yes Historical Provider, MD  latanoprost (XALATAN) 0.005 % ophthalmic solution Place 1 drop into both eyes daily. Instill 1 drop in each eye once daily for glaucoma.   Yes Historical Provider, MD  metoprolol tartrate (LOPRESSOR) 25 MG tablet Take 12.5 mg by mouth 2 (two) times daily. HOLD if HR under 60   Yes Historical Provider, MD  mirabegron ER (MYRBETRIQ) 25 MG TB24 tablet Take 1 tablet (25 mg total) by mouth daily. 09/24/13  Yes Blanchie Serve, MD  Multiple Vitamins-Minerals (DECUBI-VITE) CAPS Take by mouth daily.   Yes Historical Provider, MD  omeprazole (PRILOSEC) 20 MG capsule Take 1 capsule (20 mg total) by mouth daily. 09/24/13  Yes Mahima Pandey, MD  senna-docusate (SENOKOT-S) 8.6-50 MG per tablet Take 2 tablets by mouth daily as needed for mild constipation.   Yes Historical Provider, MD  Timolol Maleate 0.5 % (DAILY) SOLN Place 1 drop into both eyes daily.    Yes Historical Provider, MD  topiramate (TOPAMAX) 25 MG tablet Take 1 tablet (25 mg total) by mouth 2 (two) times daily. 11/07/12  Yes Mahima Bubba Camp, MD  traMADol (ULTRAM) 50 MG tablet Take 1 tablet (50 mg total) by mouth every 8 (eight) hours as needed. For pain 09/17/14  Yes Monica Carter, DO   BP 188/83 mmHg  Pulse 65  Temp(Src)  97.7 F (36.5 C) (Oral)  Resp 21  Ht 5\' 4"  (1.626 m)  Wt 100 lb (45.36 kg)  BMI 17.16 kg/m2  SpO2 96% Physical Exam  Constitutional: She appears well-developed and well-nourished.  HENT:  Head: Normocephalic and atraumatic.  Eyes: EOM are normal. Pupils are equal, round, and reactive to light.  Neck: Neck supple.  Cardiovascular: Normal rate and regular rhythm.   Murmur heard. Pulmonary/Chest: Effort normal. No respiratory distress.  Abdominal: Soft. She exhibits no distension. There is no tenderness. There is no rebound and no guarding.  Neurological: She is alert.  Skin: Skin is warm and dry.  Pressure ulcer - sacral. Unstagable, but there is no skin break down  Nursing note and vitals reviewed.   ED Course  Procedures (including critical care time) Labs Review Labs Reviewed  BASIC METABOLIC PANEL - Abnormal; Notable for the following:    BUN 27 (*)    Creatinine, Ser 1.22 (*)    GFR calc non Af Amer 37 (*)  GFR calc Af Amer 43 (*)    All other components within normal limits  URINALYSIS, ROUTINE W REFLEX MICROSCOPIC (NOT AT [facility]) - Abnormal; Notable for the following:    Protein, ur 30 (*)    All other components within normal limits  URINE CULTURE  CBC WITH DIFFERENTIAL/PLATELET  URINE MICROSCOPIC-ADD ON  I-STAT TROPOININ, ED    Imaging Review No results found.   EKG Interpretation   Date/Time:  Wednesday September 24 2014 08:27:08 EDT Ventricular Rate:  64 PR Interval:  215 QRS Duration: 94 QT Interval:  429 QTC Calculation: 443 R Axis:   -62 Text Interpretation:  Sinus rhythm Borderline prolonged PR interval Left  anterior fascicular block Probable LVH with secondary repol abnrm Anterior  Q waves, possibly due to LVH Nonspecific ST and T wave abnormality No  significant change since last tracing Reconfirmed by Khiley Lieser, MD, Thelma Comp  [phone]) on 09/24/2014 11:30:10 AM      MDM   Final diagnoses:  Generalized weakness  Sacral pressure sore, unstageable   Constipation, unspecified constipation type    Pt comes in weakness.  DDx: Sepsis syndrome ACS syndrome DKA ICH Stroke CHF exacerbation COPD exacerbation Infection - pneumonia/UTI/Cellulitis PE Dehydration Electrolyte abnormality Tox syndrome  Basic screening labs for ACS, infection done - and are neg. Lytes are normal as well. Pt might have a bit of constipation, and she has a new pressure ulcer - and we have drawn the attention of the SNF to that matter. Patient is alert and oriented. No further workup indicated.     Varney Biles, MD 09/24/14 1704

## 2014-09-24 NOTE — ED Notes (Signed)
Pt. Coming from [facility] [address]. Pt. Complaint of HA starting at 0630 this AM. Nursing staff gave pt. Tramadol which helped with the pain at the time. Around 0700 pt. Began complaining of CP and generalized body pain. Pt. Denies CP at this time and with EMS. Pt. Had hip replaced a couple months ago and has had difficulty controlling BP since.

## 2014-09-29 LAB — URINE CULTURE: Culture: >=100000

## 2014-10-01 ENCOUNTER — Telehealth (HOSPITAL_COMMUNITY): Payer: Self-pay

## 2014-10-14 ENCOUNTER — Telehealth: Payer: Self-pay | Admitting: *Deleted

## 2014-10-14 NOTE — Telephone Encounter (Signed)
Received fax from [facility]  Chest X-Ray: Correlate clinically for potential minimal to mild ongoing congestive heart failure or cardiac decompensation. Continued follow up advised. Given to Sherrie Mustache, NP to review and sign. Patient resident of [place]

## 2014-10-23 ENCOUNTER — Telehealth: Payer: Self-pay

## 2014-10-23 NOTE — Telephone Encounter (Signed)
All vitals were good B/P 130/70, did not take pulse oxygen level because no order was given to do so. [name] talked with nurse at [facility] and was told to do 15 min breathing treatment with albuterol, afterwards respirations decreased to 17 and patient feels much better.

## 2014-10-23 NOTE — Telephone Encounter (Signed)
Are pts vital signs stable? Need pulse ox? How are the breath Breath sounds? Cough congestion, does the pt feel short of breath? Anxiety? If ongoing will need evaluation in clinic or at urgent care with any abnormal findings on assessment

## 2014-10-23 NOTE — Telephone Encounter (Signed)
Left message on voicemail for Carrie Moran to return call when available

## 2014-10-23 NOTE — Telephone Encounter (Signed)
[name] called on patient's behalf, patient with onset of shallow/labored breathing and increased respirations  (21). Patient is not in any acute distress. This episode started 15 minutes ago.   Patient with ongoing/ long-term  history of breathing issues. Please advise

## 2014-10-30 ENCOUNTER — Ambulatory Visit: Payer: Medicare Other | Admitting: Nurse Practitioner

## 2015-01-14 ENCOUNTER — Other Ambulatory Visit: Payer: Self-pay

## 2015-01-14 DIAGNOSIS — M25569 Pain in unspecified knee: Secondary | ICD-10-CM

## 2015-01-14 MED ORDER — TRAMADOL HCL 50 MG PO TABS: 50.0000 mg | ORAL_TABLET | Freq: Three times a day (TID) | ORAL | Status: AC | PRN

## 2015-01-14 NOTE — Telephone Encounter (Signed)
[facility] sent a request for refill on Tramadol for patient. RX printed, attached with paperwork and given to Kirk for signature. PCP is out of office on Wednesday

## 2015-01-27 ENCOUNTER — Ambulatory Visit (INDEPENDENT_AMBULATORY_CARE_PROVIDER_SITE_OTHER): Payer: Medicare Other | Admitting: Internal Medicine

## 2015-01-27 ENCOUNTER — Encounter: Payer: Self-pay | Admitting: Internal Medicine

## 2015-01-27 VITALS — BP 152/78 | HR 53 | Temp 97.4°F | Resp 18

## 2015-01-27 DIAGNOSIS — L89152 Pressure ulcer of sacral region, stage 2: Secondary | ICD-10-CM

## 2015-01-27 NOTE — Progress Notes (Signed)
Patient ID: Carrie Moran, female   DOB: [birth year], 81 y.o.   MRN: [id number]    Facility  [facility]    Place of Service:   OFFICE    Allergies  Allergen Reactions  . Sulfa Antibiotics     Unknown reaction.     Chief Complaint  Patient presents with  . Acute Visit    open area on left buttock, pain on left side.    HPI:  Decubitus ulcer of sacral region, stage 2 - patient presents today with a painful shallow ulceration in the sacral area. It has been there for weeks if not months. She is presently living in an assisted living building. Family was concerned because the scab that was over this area has broken off and patient is complaining of pain that disturbs her sleep. There is no bleeding.  Patient is quite sedentary. She spends a lot of the day sitting in bed or chair.    Medications: Patient's Medications  New Prescriptions   No medications on file  Previous Medications   ACETAMINOPHEN (TYLENOL) 500 MG TABLET    Take 1,000 mg by mouth every 6 (six) hours as needed for mild pain.   ALBUTEROL (PROVENTIL) (2.5 MG/3ML) 0.083% NEBULIZER SOLUTION    Take 3 mLs (2.5 mg total) by nebulization every 4 (four) hours as needed for wheezing or shortness of breath.   AMBULATORY NON FORMULARY MEDICATION    Ted compression stockings for both legs- wear them in the morning and remove it before going to bed   AMBULATORY NON FORMULARY MEDICATION    3-in-1 Commode To prevent pain during toileting task and increase patient safety   AMIODARONE (PACERONE) 200 MG TABLET    Take 200 mg by mouth daily.   AMLODIPINE (NORVASC) 10 MG TABLET    Take one tablet once daily to control Blood pressure   ASPIRIN 81 MG TABLET    Take 81 mg by mouth daily.   CILOSTAZOL (PLETAL) 50 MG TABLET    Take 50 mg by mouth 2 (two) times daily.   CLONIDINE (CATAPRES) 0.1 MG TABLET    Take 1 tablet (0.1 mg total) by mouth 2 (two) times daily.   GABAPENTIN (NEURONTIN) 100 MG CAPSULE    Take 1 capsule (100 mg total) by mouth  at bedtime.   GERIATRIC MULTIVITAMINS-MINERALS (ELDERTONIC/GEVRABON) ELIX    Take 15 mLs by mouth 3 (three) times daily before meals.   LATANOPROST (XALATAN) 0.005 % OPHTHALMIC SOLUTION    Place 1 drop into both eyes daily. Instill 1 drop in each eye once daily for glaucoma.   METOPROLOL TARTRATE (LOPRESSOR) 25 MG TABLET    Take 12.5 mg by mouth 2 (two) times daily. HOLD if HR under 60   MIRABEGRON ER (MYRBETRIQ) 25 MG TB24 TABLET    Take 1 tablet (25 mg total) by mouth daily.   MULTIPLE VITAMINS-MINERALS (DECUBI-VITE) CAPS    Take by mouth daily.   OMEPRAZOLE (PRILOSEC) 20 MG CAPSULE    Take 1 capsule (20 mg total) by mouth daily.   SENNA-DOCUSATE (SENOKOT-S) 8.6-50 MG PER TABLET    Take 2 tablets by mouth daily as needed for mild constipation.   TIMOLOL MALEATE 0.5 % (DAILY) SOLN    Place 1 drop into both eyes daily.    TOPIRAMATE (TOPAMAX) 25 MG TABLET    Take 1 tablet (25 mg total) by mouth 2 (two) times daily.   TRAMADOL (ULTRAM) 50 MG TABLET    Take 1 tablet (50 mg  total) by mouth every 8 (eight) hours as needed. For pain  Modified Medications   No medications on file  Discontinued Medications   No medications on file    Review of Systems  Constitutional: Negative for fever, chills and diaphoresis.  HENT:       Reports increased saliva and trouble swallowing   Respiratory: Positive for shortness of breath (appears random, no increase shortness of breath with exertion or laying down). Negative for cough and wheezing.   Cardiovascular: Positive for leg swelling (uses TED). Negative for chest pain and palpitations.  Gastrointestinal: Negative for nausea, vomiting, abdominal pain, diarrhea and constipation.  Genitourinary: Positive for frequency (OAB). Negative for dysuria.  Musculoskeletal: Negative for myalgias.  Skin: Negative for rash.       Shallow, painful grade 2 sacral decubitus ulceration at the left side of the gluteal cleft.  Neurological: Negative for dizziness and  headaches.  Psychiatric/Behavioral: Positive for confusion (memory issues). The patient is not nervous/anxious.     Filed Vitals:   01/27/15 1641  BP: 152/78  Pulse: 53  Temp: 97.4 F (36.3 C)  TempSrc: Oral  Resp: 18  SpO2: 98%   There is no weight on file to calculate BMI.  Physical Exam  Constitutional: She appears well-developed and well-nourished.  Thin white female  HENT:  Mouth/Throat: Oropharynx is clear and moist. No oropharyngeal exudate.  Neck: Normal range of motion. Neck supple.  Cardiovascular: Regular rhythm and normal heart sounds.  Bradycardia present.   Pulmonary/Chest: Effort normal and breath sounds normal.  Abdominal: Soft. Bowel sounds are normal. She exhibits no distension.  Musculoskeletal: She exhibits edema (+1 bilaterally). She exhibits no tenderness.  Neurological: She is alert.  Skin: Skin is warm and dry.  Grade 2/4 sacral decubitus on the left side of the gluteal cleft. No bleeding. Approximately three-quarter inch circumference. Midportion has been scraped off and there is a raw surface that is not bleeding.  Psychiatric: She has a normal mood and affect.    Labs reviewed: Lab Summary Latest Ref Rng 09/24/2014 08/18/2014 07/24/2014 06/24/2014 06/23/2014 06/22/2014 06/21/2014  Hemoglobin 12.0 - 15.0 g/dL 13.7 (None) 11.9 (None) (None) 10.3(L) 11.6(L)  Hematocrit 36.0 - 46.0 % 41.7 (None) 37.5 (None) (None) 31.9(L) 36.4  White count 4.0 - 10.5 K/uL 7.2 (None) 5.2 (None) (None) 7.5 15.6(H)  Platelet count 150 - 400 K/uL 184 (None) 197 (None) (None) 125(L) 178  Sodium 135 - 145 mmol/L 139 141 139 139 143 147(H) 146(H)  Potassium 3.5 - 5.1 mmol/L 3.7 3.9 5.0 3.2(L) 3.3(L) 3.5 3.9  Calcium 8.9 - 10.3 mg/dL 9.2 9.5 9.2 8.5 8.3(L) 8.5 8.8  Phosphorus - (None) (None) (None) (None) (None) (None) (None)  Creatinine 0.44 - 1.00 mg/dL 1.22(H) 1.12(H) 1.05(H) 0.93 0.95 0.85 1.05  AST 0 - 40 IU/L (None) (None) 23 (None) (None) (None) (None)  Alk Phos 39 - 117  IU/L (None) (None) 162(H) (None) (None) (None) (None)  Bilirubin 0.0 - 1.2 mg/dL (None) (None) 0.2 (None) (None) (None) (None)  Glucose 65 - 99 mg/dL 99 75 92 111(H) 97 114(H) 197(H)  Cholesterol - (None) (None) (None) (None) (None) (None) (None)  HDL cholesterol - (None) (None) (None) (None) (None) (None) (None)  Triglycerides - (None) (None) (None) (None) (None) (None) (None)  LDL Direct - (None) (None) (None) (None) (None) (None) (None)  LDL Calc - (None) (None) (None) (None) (None) (None) (None)  Total protein - (None) (None) (None) (None) (None) (None) (None)  Albumin 3.2 - 4.6 g/dL (None) (None)  3.8 (None) (None) (None) (None)   Lab Results  Component Value Date   TSH 3.660 06/22/2014   Lab Results  Component Value Date   BUN 27* 09/24/2014   No results found for: HGBA1C  Assessment/Plan  1. Decubitus ulcer of sacral region, stage 2 - Apply Tegaderm dressing after cleansing the area Change as needed for wrinkling or soilage or every 7 days.

## 2015-02-03 ENCOUNTER — Ambulatory Visit: Payer: Medicare Other | Admitting: Nurse Practitioner

## 2015-02-11 ENCOUNTER — Ambulatory Visit: Payer: Medicare Other | Admitting: Internal Medicine

## 2015-03-11 ENCOUNTER — Encounter: Payer: Self-pay | Admitting: Internal Medicine

## 2015-03-11 ENCOUNTER — Ambulatory Visit (INDEPENDENT_AMBULATORY_CARE_PROVIDER_SITE_OTHER): Payer: Medicare Other | Admitting: Internal Medicine

## 2015-03-11 VITALS — BP 128/62 | HR 50 | Temp 97.4°F | Resp 20 | Ht 64.0 in | Wt 92.6 lb

## 2015-03-11 DIAGNOSIS — N183 Chronic kidney disease, stage 3 unspecified: Secondary | ICD-10-CM

## 2015-03-11 DIAGNOSIS — K219 Gastro-esophageal reflux disease without esophagitis: Secondary | ICD-10-CM | POA: Diagnosis not present

## 2015-03-11 DIAGNOSIS — L89152 Pressure ulcer of sacral region, stage 2: Secondary | ICD-10-CM

## 2015-03-11 DIAGNOSIS — I10 Essential (primary) hypertension: Secondary | ICD-10-CM

## 2015-03-11 DIAGNOSIS — R634 Abnormal weight loss: Secondary | ICD-10-CM | POA: Diagnosis not present

## 2015-03-11 DIAGNOSIS — R531 Weakness: Secondary | ICD-10-CM | POA: Diagnosis not present

## 2015-03-11 DIAGNOSIS — I4891 Unspecified atrial fibrillation: Secondary | ICD-10-CM | POA: Diagnosis not present

## 2015-03-11 DIAGNOSIS — R2681 Unsteadiness on feet: Secondary | ICD-10-CM

## 2015-03-11 NOTE — Progress Notes (Signed)
Patient ID: Carrie Moran, female   DOB: 06-Jun-1917, 81 y.o.   MRN: [id number]    Facility  [facility]    Place of Service:   OFFICE    Allergies  Allergen Reactions  . Sulfa Antibiotics     Unknown reaction.     Chief Complaint  Patient presents with  . Medical Management of Chronic Issues    2 week f/u    HPI:  Seen for Decubitus ulcer of sacral region on 01/27/2015. The area is now fully healed.  Patient complains of generalized weakness. States she would like to learn to walk again. She is able to stand with some assistance. She is too unstable at the present time to walk independently.  Complains of some shortness of breath with exertion.   Medications: Patient's Medications  New Prescriptions   No medications on file  Previous Medications   ACETAMINOPHEN (TYLENOL) 500 MG TABLET    Take 1,000 mg by mouth every 6 (six) hours as needed for mild pain.   ALBUTEROL (PROVENTIL) (2.5 MG/3ML) 0.083% NEBULIZER SOLUTION    Take 3 mLs (2.5 mg total) by nebulization every 4 (four) hours as needed for wheezing or shortness of breath.   AMBULATORY NON FORMULARY MEDICATION    Ted compression stockings for both legs- wear them in the morning and remove it before going to bed   AMBULATORY NON FORMULARY MEDICATION    3-in-1 Commode To prevent pain during toileting task and increase patient safety   AMIODARONE (PACERONE) 200 MG TABLET    Take 200 mg by mouth daily.   AMLODIPINE (NORVASC) 10 MG TABLET    Take one tablet once daily to control Blood pressure   ASPIRIN 81 MG TABLET    Take 81 mg by mouth daily.   CILOSTAZOL (PLETAL) 50 MG TABLET    Take 50 mg by mouth 2 (two) times daily.   CLONIDINE (CATAPRES) 0.1 MG TABLET    Take 1 tablet (0.1 mg total) by mouth 2 (two) times daily.   GABAPENTIN (NEURONTIN) 100 MG CAPSULE    Take 1 capsule (100 mg total) by mouth at bedtime.   GERIATRIC MULTIVITAMINS-MINERALS (ELDERTONIC/GEVRABON) ELIX    Take 15 mLs by mouth 3 (three) times daily before  meals.   LATANOPROST (XALATAN) 0.005 % OPHTHALMIC SOLUTION    Place 1 drop into both eyes daily. Instill 1 drop in each eye once daily for glaucoma.   METOPROLOL TARTRATE (LOPRESSOR) 25 MG TABLET    Take 12.5 mg by mouth 2 (two) times daily. HOLD if HR under 60   MIRABEGRON ER (MYRBETRIQ) 25 MG TB24 TABLET    Take 1 tablet (25 mg total) by mouth daily.   MULTIPLE VITAMINS-MINERALS (DECUBI-VITE) CAPS    Take by mouth daily.   OMEPRAZOLE (PRILOSEC) 20 MG CAPSULE    Take 1 capsule (20 mg total) by mouth daily.   SENNA-DOCUSATE (SENOKOT-S) 8.6-50 MG PER TABLET    Take 2 tablets by mouth daily as needed for mild constipation.   TIMOLOL MALEATE 0.5 % (DAILY) SOLN    Place 1 drop into both eyes daily.    TOPIRAMATE (TOPAMAX) 25 MG TABLET    Take 1 tablet (25 mg total) by mouth 2 (two) times daily.   TRAMADOL (ULTRAM) 50 MG TABLET    Take 1 tablet (50 mg total) by mouth every 8 (eight) hours as needed. For pain  Modified Medications   No medications on file  Discontinued Medications   No medications on file  Review of Systems  Constitutional: Positive for unexpected weight change. Negative for fever, chills, diaphoresis, activity change, appetite change and fatigue.  HENT: Negative for congestion, ear discharge, ear pain, hearing loss, postnasal drip, rhinorrhea, sore throat, tinnitus, trouble swallowing and voice change.        Reports increased saliva and trouble swallowing   Eyes: Negative for pain, redness, itching and visual disturbance.  Respiratory: Positive for choking and shortness of breath (shortness of breath with exertion or laying down). Negative for cough and wheezing.   Cardiovascular: Positive for leg swelling (uses TED). Negative for chest pain and palpitations.  Gastrointestinal: Negative for nausea, vomiting, abdominal pain, diarrhea, constipation and abdominal distention.  Endocrine: Negative for cold intolerance, heat intolerance, polydipsia, polyphagia and polyuria.    Genitourinary: Positive for frequency (OAB). Negative for dysuria, urgency, hematuria, flank pain, vaginal discharge, difficulty urinating and pelvic pain.  Musculoskeletal: Negative for myalgias, back pain, arthralgias, gait problem, neck pain and neck stiffness.  Skin: Negative for color change, pallor and rash.       Healed grade 2 sacral decubitus ulceration at the left side of the gluteal cleft.  Allergic/Immunologic: Negative.   Neurological: Positive for weakness. Negative for dizziness, tremors, seizures, syncope, numbness and headaches.  Hematological: Negative for adenopathy. Does not bruise/bleed easily.  Psychiatric/Behavioral: Positive for confusion (memory issues). Negative for suicidal ideas, hallucinations, behavioral problems, sleep disturbance, dysphoric mood and agitation. The patient is not nervous/anxious and is not hyperactive.     Filed Vitals:   03/11/15 1205  BP: 128/62  Pulse: 50  Temp: 97.4 F (36.3 C)  TempSrc: Oral  Resp: 20  Height: 5' 4"  (1.626 m)  Weight: 92 lb 9.6 oz (42.003 kg)  SpO2: 98%   Body mass index is 15.89 kg/(m^2).  Physical Exam  Constitutional: She is oriented to person, place, and time. She appears well-developed and well-nourished. No distress.  Thin white female  HENT:  Right Ear: External ear normal.  Left Ear: External ear normal.  Nose: Nose normal.  Mouth/Throat: Oropharynx is clear and moist. No oropharyngeal exudate.  Eyes: Conjunctivae and EOM are normal. Pupils are equal, round, and reactive to light. No scleral icterus.  Neck: Normal range of motion. Neck supple. No JVD present. No tracheal deviation present. No thyromegaly present.  Cardiovascular: Regular rhythm, normal heart sounds and intact distal pulses.  Bradycardia present.  Exam reveals no gallop and no friction rub.   No murmur heard. Pulmonary/Chest: Effort normal. No respiratory distress. She has no wheezes. She has rales. She exhibits no tenderness.   Abdominal: Soft. Bowel sounds are normal. She exhibits no distension and no mass. There is no tenderness.  Musculoskeletal: Normal range of motion. She exhibits no edema or tenderness.  Able to stand with assistance. Unable to walk. Transported by wheelchair.  Lymphadenopathy:    She has no cervical adenopathy.  Neurological: She is alert and oriented to person, place, and time. No cranial nerve deficit. Coordination normal.  Skin: Skin is warm and dry. No rash noted. She is not diaphoretic. No erythema. No pallor.  Grade 2/4 sacral decubitus on the left side of the gluteal cleft. No bleeding. Approximately three-quarter inch circumference. Midportion has been scraped off and there is a raw surface that is not bleeding.  Psychiatric: She has a normal mood and affect. Her behavior is normal. Judgment and thought content normal.    Labs reviewed: Lab Summary Latest Ref Rng 09/24/2014 08/18/2014 07/24/2014 06/24/2014 06/23/2014 06/22/2014 06/21/2014  Hemoglobin 12.0 - 15.0  g/dL 13.7 (None) 11.9 (None) (None) 10.3(L) 11.6(L)  Hematocrit 36.0 - 46.0 % 41.7 (None) 37.5 (None) (None) 31.9(L) 36.4  White count 4.0 - 10.5 K/uL 7.2 (None) 5.2 (None) (None) 7.5 15.6(H)  Platelet count 150 - 400 K/uL 184 (None) 197 (None) (None) 125(L) 178  Sodium 135 - 145 mmol/L 139 141 139 139 143 147(H) 146(H)  Potassium 3.5 - 5.1 mmol/L 3.7 3.9 5.0 3.2(L) 3.3(L) 3.5 3.9  Calcium 8.9 - 10.3 mg/dL 9.2 9.5 9.2 8.5 8.3(L) 8.5 8.8  Phosphorus - (None) (None) (None) (None) (None) (None) (None)  Creatinine 0.44 - 1.00 mg/dL 1.22(H) 1.12(H) 1.05(H) 0.93 0.95 0.85 1.05  AST 0 - 40 IU/L (None) (None) 23 (None) (None) (None) (None)  Alk Phos 39 - 117 IU/L (None) (None) 162(H) (None) (None) (None) (None)  Bilirubin 0.0 - 1.2 mg/dL (None) (None) 0.2 (None) (None) (None) (None)  Glucose 65 - 99 mg/dL 99 75 92 111(H) 97 114(H) 197(H)  Cholesterol - (None) (None) (None) (None) (None) (None) (None)  HDL cholesterol - (None) (None)  (None) (None) (None) (None) (None)  Triglycerides - (None) (None) (None) (None) (None) (None) (None)  LDL Direct - (None) (None) (None) (None) (None) (None) (None)  LDL Calc - (None) (None) (None) (None) (None) (None) (None)  Total protein - (None) (None) (None) (None) (None) (None) (None)  Albumin 3.2 - 4.6 g/dL (None) (None) 3.8 (None) (None) (None) (None)   Lab Results  Component Value Date   TSH 3.660 06/22/2014   Lab Results  Component Value Date   BUN 27* 09/24/2014   No results found for: HGBA1C  Assessment/Plan  1. Atrial fibrillation with RVR (HCC) Controlled  2. Decubitus ulcer of sacral region, stage 2 Healed  3. Essential hypertension, benign Controlled  4. Gastroesophageal reflux disease, esophagitis presence not specified Asymptomatic  5. Loss of weight Etiology undetermined  6. Stage III chronic kidney disease Needs follow-up lab next visit  7. Unstable gait Physical therapy for strengthening and gait training  8. Weak Physical therapy ordered for strengthening

## 2015-03-15 ENCOUNTER — Emergency Department (HOSPITAL_COMMUNITY): Payer: Medicare Other

## 2015-03-15 ENCOUNTER — Encounter (HOSPITAL_COMMUNITY): Payer: Self-pay | Admitting: *Deleted

## 2015-03-15 ENCOUNTER — Emergency Department (HOSPITAL_COMMUNITY)
Admission: EM | Admit: 2015-03-15 | Discharge: 2015-03-15 | Disposition: A | Discharge status: Home / Self Care | Payer: Medicare Other | Attending: Emergency Medicine | Admitting: Emergency Medicine

## 2015-03-15 DIAGNOSIS — T148XXA Other injury of unspecified body region, initial encounter: Secondary | ICD-10-CM

## 2015-03-15 DIAGNOSIS — Z87891 Personal history of nicotine dependence: Secondary | ICD-10-CM | POA: Diagnosis not present

## 2015-03-15 DIAGNOSIS — W19XXXA Unspecified fall, initial encounter: Secondary | ICD-10-CM

## 2015-03-15 DIAGNOSIS — S79912A Unspecified injury of left hip, initial encounter: Secondary | ICD-10-CM | POA: Insufficient documentation

## 2015-03-15 DIAGNOSIS — K219 Gastro-esophageal reflux disease without esophagitis: Secondary | ICD-10-CM | POA: Diagnosis not present

## 2015-03-15 DIAGNOSIS — Y998 Other external cause status: Secondary | ICD-10-CM | POA: Insufficient documentation

## 2015-03-15 DIAGNOSIS — Z7982 Long term (current) use of aspirin: Secondary | ICD-10-CM | POA: Insufficient documentation

## 2015-03-15 DIAGNOSIS — F329 Major depressive disorder, single episode, unspecified: Secondary | ICD-10-CM | POA: Insufficient documentation

## 2015-03-15 DIAGNOSIS — Y9389 Activity, other specified: Secondary | ICD-10-CM | POA: Diagnosis not present

## 2015-03-15 DIAGNOSIS — M1990 Unspecified osteoarthritis, unspecified site: Secondary | ICD-10-CM | POA: Diagnosis not present

## 2015-03-15 DIAGNOSIS — S51012A Laceration without foreign body of left elbow, initial encounter: Secondary | ICD-10-CM | POA: Insufficient documentation

## 2015-03-15 DIAGNOSIS — Z8701 Personal history of pneumonia (recurrent): Secondary | ICD-10-CM | POA: Insufficient documentation

## 2015-03-15 DIAGNOSIS — H409 Unspecified glaucoma: Secondary | ICD-10-CM | POA: Insufficient documentation

## 2015-03-15 DIAGNOSIS — W01198A Fall on same level from slipping, tripping and stumbling with subsequent striking against other object, initial encounter: Secondary | ICD-10-CM | POA: Insufficient documentation

## 2015-03-15 DIAGNOSIS — I129 Hypertensive chronic kidney disease with stage 1 through stage 4 chronic kidney disease, or unspecified chronic kidney disease: Secondary | ICD-10-CM | POA: Diagnosis not present

## 2015-03-15 DIAGNOSIS — Y92121 Bathroom in nursing home as the place of occurrence of the external cause: Secondary | ICD-10-CM | POA: Diagnosis not present

## 2015-03-15 DIAGNOSIS — S41111A Laceration without foreign body of right upper arm, initial encounter: Secondary | ICD-10-CM | POA: Insufficient documentation

## 2015-03-15 DIAGNOSIS — Z79899 Other long term (current) drug therapy: Secondary | ICD-10-CM | POA: Insufficient documentation

## 2015-03-15 DIAGNOSIS — N183 Chronic kidney disease, stage 3 (moderate): Secondary | ICD-10-CM | POA: Diagnosis not present

## 2015-03-15 DIAGNOSIS — S6991XA Unspecified injury of right wrist, hand and finger(s), initial encounter: Secondary | ICD-10-CM | POA: Diagnosis present

## 2015-03-15 DIAGNOSIS — G2581 Restless legs syndrome: Secondary | ICD-10-CM | POA: Diagnosis not present

## 2015-03-15 HISTORY — DX: Pneumonia, unspecified organism: J18.9

## 2015-03-15 LAB — BASIC METABOLIC PANEL
Anion gap: 7 (ref 5–15)
BUN: 26 mg/dL — AB (ref 6–20)
CHLORIDE: 107 mmol/L (ref 101–111)
CO2: 26 mmol/L (ref 22–32)
Calcium: 8.8 mg/dL — ABNORMAL LOW (ref 8.9–10.3)
Creatinine, Ser: 1.11 mg/dL — ABNORMAL HIGH (ref 0.44–1.00)
GFR calc non Af Amer: 41 mL/min — ABNORMAL LOW (ref >60)
GFR, EST AFRICAN AMERICAN: 47 mL/min — AB (ref >60)
GLUCOSE: 100 mg/dL — AB (ref 65–99)
Potassium: 4 mmol/L (ref 3.5–5.1)
SODIUM: 140 mmol/L (ref 135–145)

## 2015-03-15 LAB — CBC WITH DIFFERENTIAL/PLATELET
Basophils Absolute: 0 10*3/uL (ref 0.0–0.1)
Basophils Relative: 0 %
EOS PCT: 2 %
Eosinophils Absolute: 0.1 10*3/uL (ref 0.0–0.7)
HCT: 41.2 % (ref 36.0–46.0)
Hemoglobin: 13.6 g/dL (ref 12.0–15.0)
LYMPHS ABS: 1.4 10*3/uL (ref 0.7–4.0)
LYMPHS PCT: 24 %
MCH: 31.3 pg (ref 26.0–34.0)
MCHC: 33 g/dL (ref 30.0–36.0)
MCV: 94.7 fL (ref 78.0–100.0)
MONO ABS: 0.4 10*3/uL (ref 0.1–1.0)
Monocytes Relative: 7 %
Neutro Abs: 3.8 10*3/uL (ref 1.7–7.7)
Neutrophils Relative %: 67 %
PLATELETS: 179 10*3/uL (ref 150–400)
RBC: 4.35 MIL/uL (ref 3.87–5.11)
RDW: 14 % (ref 11.5–15.5)
WBC: 5.8 10*3/uL (ref 4.0–10.5)

## 2015-03-15 LAB — URINALYSIS, ROUTINE W REFLEX MICROSCOPIC
BILIRUBIN URINE: NEGATIVE
Glucose, UA: NEGATIVE mg/dL
Hgb urine dipstick: NEGATIVE
Ketones, ur: NEGATIVE mg/dL
Leukocytes, UA: NEGATIVE
NITRITE: NEGATIVE
Protein, ur: NEGATIVE mg/dL
SPECIFIC GRAVITY, URINE: 1.016 (ref 1.005–1.030)
pH: 7 (ref 5.0–8.0)

## 2015-03-15 NOTE — ED Notes (Addendum)
Per EMS - patient comes from [facility] on [address] after getting tripped up over her walker this morning and falling.  Patient presents with bilateral skin tears to upper arms (left elbow and right upper arm).  Patient also c/o sacral pain where she landed during the fall.  Patient denies neck and back pain.  Patient and staff deny LOC or that patient hit head during fall.  SNF staff state patient is at baseline mentation.

## 2015-03-15 NOTE — ED Notes (Addendum)
Patient comes from [facility] after tripping over her walker while going to the bathroom earlier this morning.  Patient states she fell, landing on her sacrum and elbows.  Patient states she hit the back of her head "a little."  Patient denies LOC.  Patient has a skin tear to her left elbow and one to her upper right arm.  Patient denies back and neck pain, but c/o sacral pain and left hip pain.  General: Awake, alert, thin, NAD Cardio: RRR, S1S2 heard, no murmurs appreciated, no pre-tibial or pedal edema, cyanosis or clubbing; +2 radial and +1 dorsalis pedis pulses bilaterally Pulm: CTAB, no wheezes, rhonchi or rales GI: Soft, NT/ND, bowel sounds present Extremities: MAE, no external or internal rotation noted. No deformities noted to right or left lower extremities (hips, knees, ankles). MSK: Normal gait and station, no arthralgias Skin: dry, intact, no rashes or lesions.  Stage 1 pressure ulcer to sacrum noted - appears to be well established Neuro: Strength and sensation grossly intact

## 2015-03-15 NOTE — Discharge Instructions (Signed)
Fall Prevention in the Home  Falls can cause injuries and can affect people from all age groups. There are many simple things that you can do to make your home safe and to help prevent falls. WHAT CAN I DO ON THE OUTSIDE OF MY HOME?  Regularly repair the edges of walkways and driveways and fix any cracks.  Remove high doorway thresholds.  Trim any shrubbery on the main path into your home.  Use bright outdoor lighting.  Clear walkways of debris and clutter, including tools and rocks.  Regularly check that handrails are securely fastened and in good repair. Both sides of any steps should have handrails.  Install guardrails along the edges of any raised decks or porches.  Have leaves, snow, and ice cleared regularly.  Use sand or salt on walkways during winter months.  In the garage, clean up any spills right away, including grease or oil spills. WHAT CAN I DO IN THE BATHROOM?  Use night lights.  Install grab bars by the toilet and in the tub and shower. Do not use towel bars as grab bars.  Use non-skid mats or decals on the floor of the tub or shower.  If you need to sit down while you are in the shower, use a plastic, non-slip stool.[place]land Kitchen  Keep the floor dry. Immediately clean up any water that spills on the floor.  Remove soap buildup in the tub or shower on a regular basis.  Attach bath mats securely with double-sided non-slip rug tape.  Remove throw rugs and other tripping hazards from the floor. WHAT CAN I DO IN THE BEDROOM?  Use night lights.  Make sure that a bedside light is easy to reach.  Do not use oversized bedding that drapes onto the floor.  Have a firm chair that has side arms to use for getting dressed.  Remove throw rugs and other tripping hazards from the floor. WHAT CAN I DO IN THE KITCHEN?   Clean up any spills right away.  Avoid walking on wet floors.  Place frequently used items in easy-to-reach places.  If you need to reach for something  above you, use a sturdy step stool that has a grab bar.  Keep electrical cables out of the way.  Do not use floor polish or wax that makes floors slippery. If you have to use wax, make sure that it is non-skid floor wax.  Remove throw rugs and other tripping hazards from the floor. WHAT CAN I DO IN THE STAIRWAYS?  Do not leave any items on the stairs.  Make sure that there are handrails on both sides of the stairs. Fix handrails that are broken or loose. Make sure that handrails are as long as the stairways.  Check any carpeting to make sure that it is firmly attached to the stairs. Fix any carpet that is loose or worn.  Avoid having throw rugs at the top or bottom of stairways, or secure the rugs with carpet tape to prevent them from moving.  Make sure that you have a light switch at the top of the stairs and the bottom of the stairs. If you do not have them, have them installed. WHAT ARE SOME OTHER FALL PREVENTION TIPS?  Wear closed-toe shoes that fit well and support your feet. Wear shoes that have rubber soles or low heels.  When you use a stepladder, make sure that it is completely opened and that the sides are firmly locked. Have someone hold the ladder while you  are using it. Do not climb a closed stepladder.  Add color or contrast paint or tape to grab bars and handrails in your home. Place contrasting color strips on the first and last steps.  Use mobility aids as needed, such as canes, walkers, scooters, and crutches.  Turn on lights if it is dark. Replace any light bulbs that burn out.  Set up furniture so that there are clear paths. Keep the furniture in the same spot.  Fix any uneven floor surfaces.  Choose a carpet design that does not hide the edge of steps of a stairway.  Be aware of any and all pets.  Review your medicines with your healthcare provider. Some medicines can cause dizziness or changes in blood pressure, which increase your risk of falling. Talk  with your health care provider about other ways that you can decrease your risk of falls. This may include working with a physical therapist or trainer to improve your strength, balance, and endurance.   This information is not intended to replace advice given to you by your health care provider. Make sure you discuss any questions you have with your health care provider.   Document Released: 03/11/2002 Document Revised: 08/05/2014 Document Reviewed: 04/25/2014 Elsevier Interactive Patient Education 2016 [employer] Skin Tear Care A skin tear is a wound in which the top layer of skin has peeled off. This is a common problem with aging because the skin becomes thinner and more fragile as a person gets older. In addition, some medicines, such as oral corticosteroids, can lead to skin thinning if taken for long periods of time.  A skin tear is often repaired with tape or skin adhesive strips. This keeps the skin that has been peeled off in contact with the healthier skin beneath. Depending on the location of the wound, a bandage (dressing) may be applied over the tape or skin adhesive strips. Sometimes, during the healing process, the skin turns black and dies. Even when this happens, the torn skin acts as a good dressing until the skin underneath gets healthier and repairs itself. HOME CARE INSTRUCTIONS   Change dressings once per day or as directed by your caregiver.  Gently clean the skin tear and the area around the tear using saline solution or mild soap and water.  Do not rub the injured skin dry. Let the area air dry.  Apply petroleum jelly or an antibiotic cream or ointment to keep the tear moist. This will help the wound heal. Do not allow a scab to form.  If the dressing sticks before the next dressing change, moisten it with warm soapy water and gently remove it.  Protect the injured skin until it has healed.  Only take over-the-counter or prescription medicines as directed by your  caregiver.  Take showers or baths using warm soapy water. Apply a new dressing after the shower or bath.  Keep all follow-up appointments as directed by your caregiver.  SEEK IMMEDIATE MEDICAL CARE IF:   You have redness, swelling, or increasing pain in the skin tear.  You havepus coming from the skin tear.  You have chills.  You have a red streak that goes away from the skin tear.  You have a bad smell coming from the tear or dressing.  You have a fever or persistent symptoms for more than 2-3 days.  You have a fever and your symptoms suddenly get worse. MAKE SURE YOU:  Understand these instructions.  Will watch this condition.  Will get  help right away if your child is not doing well or gets worse.   This information is not intended to replace advice given to you by your health care provider. Make sure you discuss any questions you have with your health care provider.   Document Released: 12/14/2000 Document Revised: 12/14/2011 Document Reviewed: 10/03/2011 Elsevier Interactive Patient Education [employer].

## 2015-03-15 NOTE — ED Notes (Signed)
Patient ambulated with a walker well.

## 2015-03-15 NOTE — ED Notes (Signed)
Patient taken to radiology 

## 2015-03-15 NOTE — ED Provider Notes (Signed)
CSN: [id number]     Arrival date & time 03/15/15  [id number] History   First MD Initiated Contact with Patient 03/15/15 [phone]     Chief Complaint  Patient presents with  . Fall     Patient is a 81 y.o. female presenting with fall. The history is provided by the patient. No language interpreter was used.  Fall   Carrie Moran is a 81 y.o. female who presents to the Emergency Department complaining of fall. She was walking with her walker this morning at Dr. Quita Skye when she got tangled in her walker and fell backwards landing on her bottom on her left side and striking her head. She denies any loss of consciousness. She reports pain to her left hip and sacral region. She denies any recent illnesses. Symptoms are mild and constant. She takes Pletal and aspirin daily. Past Medical History  Diagnosis Date  . Hypertension   . Memory loss   . GERD (gastroesophageal reflux disease)   . RLS (restless legs syndrome)   . Hypertonicity of bladder   . Renal insufficiency   . Other headache syndromes(339.89)   . Osteoporosis, unspecified   . Urinary frequency   . Accelerated hypertension 07/09/2013  . Stage III chronic kidney disease 06/17/2014  . Hip fracture requiring operative repair ([place]) 06/17/2014  . Primary osteoarthritis involving multiple joints 09/24/2013  . PVD (peripheral vascular disease) ([place]) 11/07/2012  . Glaucoma 11/07/2012  . Depression 11/07/2012  . Overactive bladder 10/17/2012  . Pneumonia    Past Surgical History  Procedure Laterality Date  . Appendectomy  1930  . Rotator cuff repair  1986  . Rotator cuff repair  2001  . Carpal tunnel release  2006    Bilateral   . Total hip arthroplasty Right 06/19/2014    Procedure: RIGHT HIP HEMI ANTERIOR APPROACH;  Surgeon: Rod Can, MD;  Location: [facility];  Service: Orthopedics;  Laterality: Right;   Family History  Problem Relation Age of Onset  . Heart attack Mother   . Cancer Brother   . Cancer Brother    Social History  Substance  Use Topics  . Smoking status: Former Research scientist (life sciences)  . Smokeless tobacco: Never Used  . Alcohol Use: No   OB History    No data available     Review of Systems  All other systems reviewed and are negative.     Allergies  Sulfa antibiotics  Home Medications   Prior to Admission medications   Medication Sig Start Date End Date Taking? Authorizing Provider  acetaminophen (TYLENOL) 500 MG tablet Take 1,000 mg by mouth every 6 (six) hours as needed for mild pain.    Historical Provider, MD  albuterol (PROVENTIL) (2.5 MG/3ML) 0.083% nebulizer solution Take 3 mLs (2.5 mg total) by nebulization every 4 (four) hours as needed for wheezing or shortness of breath. 06/22/14   Orson Eva, MD  AMBULATORY NON FORMULARY MEDICATION Ted compression stockings for both legs- wear them in the morning and remove it before going to bed    Historical Provider, MD  AMBULATORY NON FORMULARY MEDICATION 3-in-1 Commode To prevent pain during toileting task and increase patient safety 08/19/14   Lauree Chandler, NP  amiodarone (PACERONE) 200 MG tablet Take 200 mg by mouth daily.    Historical Provider, MD  amLODipine (NORVASC) 10 MG tablet Take one tablet once daily to control Blood pressure    Historical Provider, MD  aspirin 81 MG tablet Take 81 mg by mouth daily.  Historical Provider, MD  cilostazol (PLETAL) 50 MG tablet Take 50 mg by mouth 2 (two) times daily.    Historical Provider, MD  cloNIDine (CATAPRES) 0.1 MG tablet Take 1 tablet (0.1 mg total) by mouth 2 (two) times daily. 10/24/12   Blanchie Serve, MD  gabapentin (NEURONTIN) 100 MG capsule Take 1 capsule (100 mg total) by mouth at bedtime. 06/06/14   Gildardo Cranker, DO  geriatric multivitamins-minerals (ELDERTONIC/GEVRABON) ELIX Take 15 mLs by mouth 3 (three) times daily before meals.    Historical Provider, MD  latanoprost (XALATAN) 0.005 % ophthalmic solution Place 1 drop into both eyes daily. Instill 1 drop in each eye once daily for glaucoma.    Historical  Provider, MD  metoprolol tartrate (LOPRESSOR) 25 MG tablet Take 12.5 mg by mouth 2 (two) times daily. HOLD if HR under 60    Historical Provider, MD  mirabegron ER (MYRBETRIQ) 25 MG TB24 tablet Take 1 tablet (25 mg total) by mouth daily. 09/24/13   Blanchie Serve, MD  Multiple Vitamins-Minerals (DECUBI-VITE) CAPS Take by mouth daily.    Historical Provider, MD  omeprazole (PRILOSEC) 20 MG capsule Take 1 capsule (20 mg total) by mouth daily. 09/24/13   Blanchie Serve, MD  senna-docusate (SENOKOT-S) 8.6-50 MG per tablet Take 2 tablets by mouth daily as needed for mild constipation.    Historical Provider, MD  Timolol Maleate 0.5 % (DAILY) SOLN Place 1 drop into both eyes daily.     Historical Provider, MD  topiramate (TOPAMAX) 25 MG tablet Take 1 tablet (25 mg total) by mouth 2 (two) times daily. 11/07/12   Blanchie Serve, MD  traMADol (ULTRAM) 50 MG tablet Take 1 tablet (50 mg total) by mouth every 8 (eight) hours as needed. For pain 01/14/15   [name], MD   BP 159/74 mmHg  Pulse 56  Temp(Src) 97.6 F (36.4 C) (Oral)  Resp 17  SpO2 95% Physical Exam  Constitutional: She is oriented to person, place, and time. She appears well-developed and well-nourished.  Appears younger than stated age  HENT:  Head: Normocephalic and atraumatic.  Eyes: EOM are normal. Pupils are equal, round, and reactive to light.  Neck:  No C-spine tenderness  Cardiovascular: Normal rate and regular rhythm.   No murmur heard. Pulmonary/Chest: Effort normal and breath sounds normal. No respiratory distress.  Abdominal: Soft. There is no tenderness. There is no rebound and no guarding.  Musculoskeletal:  Skin tear over the left posterior elbow without any local tenderness or swelling. There is a skin tear over the right upper arm without any tenderness or swelling. There is mild posterior left hip tenderness to palpation with preserved range of motion. Moves all extremities symmetrically and strongly. No thoracic or  lumbar tenderness to palpation.  Neurological: She is alert and oriented to person, place, and time.  Skin: Skin is warm and dry.  Psychiatric: She has a normal mood and affect. Her behavior is normal.  Nursing note and vitals reviewed.   ED Course  Procedures (including critical care time) Labs Review Labs Reviewed  BASIC METABOLIC PANEL - Abnormal; Notable for the following:    Glucose, Bld 100 (*)    BUN 26 (*)    Creatinine, Ser 1.11 (*)    Calcium 8.8 (*)    GFR calc non Af Amer 41 (*)    GFR calc Af Amer 47 (*)    All other components within normal limits  URINALYSIS, ROUTINE W REFLEX MICROSCOPIC (NOT AT [facility]  CBC WITH  DIFFERENTIAL/PLATELET    Imaging Review Ct Head Wo Contrast  03/15/2015  CLINICAL DATA:  Pain following fall EXAM: CT HEAD WITHOUT CONTRAST CT CERVICAL SPINE WITHOUT CONTRAST TECHNIQUE: Multidetector CT imaging of the head and cervical spine was performed following the standard protocol without intravenous contrast. Multiplanar CT image reconstructions of the cervical spine were also generated. COMPARISON:  CT head and CT cervical spine January 30, 2010; brain MRI January 30, 2010 FINDINGS: CT HEAD FINDINGS Moderate diffuse atrophy is stable. There is no intracranial mass hemorrhage, extra-axial fluid collection, or midline shift. There is small vessel disease throughout the centra semiovale bilaterally. There is stable confluent decreased attenuation in the white matter of the mid right centrum semiovale, consistent with prior infarct in this area, stable. There is no new gray-white compartment lesion. No acute infarct evident. Bony calvarium appears intact. The mastoid air cells are clear. No intraorbital lesions are identified. CT CERVICAL SPINE FINDINGS There is no demonstrable fracture. Slight anterolisthesis of C4 on C5 is stable. Minimal anterolisthesis of C7 on T1 is stable. There is no new spondylolisthesis. Prevertebral soft tissues and predental space  regions are normal. There is moderately severe disc space narrowing at C4-5, C5-6, C6-7, and C7-T1, stable. There is facet hypertrophy at most levels bilaterally with exit foraminal narrowing present at multiple levels, most severe at C4-5, C5-6, and C6-7 bilaterally. Bones are osteoporotic. There is calcification in both carotid arteries. IMPRESSION: CT head: Stable atrophy with extensive supratentorial small vessel disease. No intracranial mass, hemorrhage, or extra-axial fluid collection. No acute infarct evident. CT cervical spine: No fracture. Areas of spondylolisthesis are stable compared to prior study with this spondylolisthesis is felt to be secondary to the extensive underlying spondylosis. Multilevel spondylosis/osteoarthritic change is grossly stable. Bones are osteoporotic. There is bilateral carotid artery calcification. Electronically Signed   By: Lowella Grip III M.D.   On: 03/15/2015 08:54   Ct Cervical Spine Wo Contrast  03/15/2015  CLINICAL DATA:  Pain following fall EXAM: CT HEAD WITHOUT CONTRAST CT CERVICAL SPINE WITHOUT CONTRAST TECHNIQUE: Multidetector CT imaging of the head and cervical spine was performed following the standard protocol without intravenous contrast. Multiplanar CT image reconstructions of the cervical spine were also generated. COMPARISON:  CT head and CT cervical spine January 30, 2010; brain MRI January 30, 2010 FINDINGS: CT HEAD FINDINGS Moderate diffuse atrophy is stable. There is no intracranial mass hemorrhage, extra-axial fluid collection, or midline shift. There is small vessel disease throughout the centra semiovale bilaterally. There is stable confluent decreased attenuation in the white matter of the mid right centrum semiovale, consistent with prior infarct in this area, stable. There is no new gray-white compartment lesion. No acute infarct evident. Bony calvarium appears intact. The mastoid air cells are clear. No intraorbital lesions are identified.  CT CERVICAL SPINE FINDINGS There is no demonstrable fracture. Slight anterolisthesis of C4 on C5 is stable. Minimal anterolisthesis of C7 on T1 is stable. There is no new spondylolisthesis. Prevertebral soft tissues and predental space regions are normal. There is moderately severe disc space narrowing at C4-5, C5-6, C6-7, and C7-T1, stable. There is facet hypertrophy at most levels bilaterally with exit foraminal narrowing present at multiple levels, most severe at C4-5, C5-6, and C6-7 bilaterally. Bones are osteoporotic. There is calcification in both carotid arteries. IMPRESSION: CT head: Stable atrophy with extensive supratentorial small vessel disease. No intracranial mass, hemorrhage, or extra-axial fluid collection. No acute infarct evident. CT cervical spine: No fracture. Areas of spondylolisthesis are stable compared to  prior study with this spondylolisthesis is felt to be secondary to the extensive underlying spondylosis. Multilevel spondylosis/osteoarthritic change is grossly stable. Bones are osteoporotic. There is bilateral carotid artery calcification. Electronically Signed   By: Lowella Grip III M.D.   On: 03/15/2015 08:54   Dg Hip Unilat With Pelvis 2-3 Views Left  03/15/2015  CLINICAL DATA:  Pain following fall EXAM: DG HIP (WITH OR WITHOUT PELVIS) 2-3V LEFT COMPARISON:  June 17, 2014 FINDINGS: Frontal pelvis as well as frontal and lateral left hip images were obtained. There is a total hip prosthesis on the right with prosthetic component well-seated. There is moderate narrowing of the left hip joint. There is no acute fracture or dislocation. No erosive change. There are multiple foci of arterial vascular calcification. IMPRESSION: No acute fracture or dislocation. Moderate narrowing left hip joint. Total hip prosthesis on the right well-seated. Multiple foci of arterial vascular calcification. Electronically Signed   By: Lowella Grip III M.D.   On: 03/15/2015 08:42   I have  personally reviewed and evaluated these images and lab results as part of my medical decision-making.   EKG Interpretation None      MDM   Final diagnoses:  Fall, initial encounter  Multiple skin tears    Patient here for evaluation of a mechanical fall. There are skin tears on examination, no evidence of acute fracture or dislocation. Patient is able to ambulate in the department without difficulty. No evidence of acute urinary tract infection. BMP similar to priors. Plan to DC home with outpatient follow-up.    Quintella Reichert, MD 03/15/15 [phone]

## 2015-03-15 NOTE — ED Notes (Signed)
In and out of cath completed with Caryl Comes, EMT as chaperone.

## 2015-03-15 NOTE — ED Notes (Signed)
Bed: [id number] Expected date: 03/15/15 Expected time: 7:41 AM Means of arrival: Ambulance Comments: fall

## 2015-03-15 NOTE — ED Notes (Signed)
Patient arm wounds cleaned with Saf Clens and dressed with tegaderm dressings.

## 2015-03-15 NOTE — ED Notes (Signed)
Patient returned from CT

## 2015-03-21 ENCOUNTER — Emergency Department (HOSPITAL_COMMUNITY): Payer: Medicare Other

## 2015-03-21 ENCOUNTER — Encounter (HOSPITAL_COMMUNITY): Payer: Self-pay | Admitting: *Deleted

## 2015-03-21 ENCOUNTER — Emergency Department (HOSPITAL_COMMUNITY)
Admission: EM | Admit: 2015-03-21 | Discharge: 2015-03-21 | Disposition: A | Discharge status: Home / Self Care | Payer: Medicare Other | Attending: Emergency Medicine | Admitting: Emergency Medicine

## 2015-03-21 DIAGNOSIS — Z87891 Personal history of nicotine dependence: Secondary | ICD-10-CM | POA: Diagnosis not present

## 2015-03-21 DIAGNOSIS — Z79899 Other long term (current) drug therapy: Secondary | ICD-10-CM | POA: Insufficient documentation

## 2015-03-21 DIAGNOSIS — Z8701 Personal history of pneumonia (recurrent): Secondary | ICD-10-CM | POA: Insufficient documentation

## 2015-03-21 DIAGNOSIS — K219 Gastro-esophageal reflux disease without esophagitis: Secondary | ICD-10-CM | POA: Diagnosis not present

## 2015-03-21 DIAGNOSIS — Z7982 Long term (current) use of aspirin: Secondary | ICD-10-CM | POA: Diagnosis not present

## 2015-03-21 DIAGNOSIS — Z9049 Acquired absence of other specified parts of digestive tract: Secondary | ICD-10-CM | POA: Diagnosis not present

## 2015-03-21 DIAGNOSIS — H409 Unspecified glaucoma: Secondary | ICD-10-CM | POA: Insufficient documentation

## 2015-03-21 DIAGNOSIS — M1990 Unspecified osteoarthritis, unspecified site: Secondary | ICD-10-CM | POA: Diagnosis not present

## 2015-03-21 DIAGNOSIS — G2581 Restless legs syndrome: Secondary | ICD-10-CM | POA: Insufficient documentation

## 2015-03-21 DIAGNOSIS — N183 Chronic kidney disease, stage 3 (moderate): Secondary | ICD-10-CM | POA: Insufficient documentation

## 2015-03-21 DIAGNOSIS — I129 Hypertensive chronic kidney disease with stage 1 through stage 4 chronic kidney disease, or unspecified chronic kidney disease: Secondary | ICD-10-CM | POA: Diagnosis not present

## 2015-03-21 DIAGNOSIS — Z8781 Personal history of (healed) traumatic fracture: Secondary | ICD-10-CM | POA: Insufficient documentation

## 2015-03-21 DIAGNOSIS — F329 Major depressive disorder, single episode, unspecified: Secondary | ICD-10-CM | POA: Diagnosis not present

## 2015-03-21 DIAGNOSIS — K5900 Constipation, unspecified: Secondary | ICD-10-CM | POA: Diagnosis present

## 2015-03-21 DIAGNOSIS — K5641 Fecal impaction: Secondary | ICD-10-CM | POA: Insufficient documentation

## 2015-03-21 MED ORDER — DOCUSATE SODIUM 250 MG PO CAPS: 250.0000 mg | ORAL_CAPSULE | Freq: Two times a day (BID) | ORAL | Status: AC

## 2015-03-21 MED ORDER — MILK AND MOLASSES ENEMA
1.0000 | Freq: Once | RECTAL | Status: AC
  Administered 2015-03-21: 250 mL via RECTAL
  Filled 2015-03-21: qty 250

## 2015-03-21 NOTE — ED Notes (Signed)
[facility] here for transport back to facility.

## 2015-03-21 NOTE — ED Provider Notes (Signed)
CSN: [id number]     Arrival date & time 03/21/15  1425 History   First MD Initiated Contact with Patient 03/21/15 1521     Chief Complaint  Patient presents with  . Constipation     (Consider location/radiation/quality/duration/timing/severity/associated sxs/prior Treatment) HPI Carrie Moran is a 81 y.o. female with with hx of htn, GERD, UTIs, renal insufficiency, presents to ED with complaint of rectal pain and constipation. Patient is from a nursing home. She is accompanied by granddaughter. Granddaughter states that she went to visit her grandmother who appeared to be in a lot of pain today. Patient states her pain is in the rectum. She states she hasn't been able to have a bowel movement in a week. Patient states that she had some abdominal pain earlier, denies any at this time. She denies any blood in her stool. She denies any nausea or vomiting. No fever. No urinary symptoms. Patient had a right hip replacement 6 months ago and currently takes tramadol for pain. Patient does not take anything for constipation. Granddaughter states she tried to administer enema to her grandmother but was unable to advance enema passed a stool bolus in the rectum. Patient received senna for constipation 2 hours prior to coming to emergency department.  Past Medical History  Diagnosis Date  . Hypertension   . Memory loss   . GERD (gastroesophageal reflux disease)   . RLS (restless legs syndrome)   . Hypertonicity of bladder   . Renal insufficiency   . Other headache syndromes(339.89)   . Osteoporosis, unspecified   . Urinary frequency   . Accelerated hypertension 07/09/2013  . Stage III chronic kidney disease 06/17/2014  . Hip fracture requiring operative repair ([place]) 06/17/2014  . Primary osteoarthritis involving multiple joints 09/24/2013  . PVD (peripheral vascular disease) ([place]) 11/07/2012  . Glaucoma 11/07/2012  . Depression 11/07/2012  . Overactive bladder 10/17/2012  . Pneumonia    Past Surgical  History  Procedure Laterality Date  . Appendectomy  1930  . Rotator cuff repair  1986  . Rotator cuff repair  2001  . Carpal tunnel release  2006    Bilateral   . Total hip arthroplasty Right 06/19/2014    Procedure: RIGHT HIP HEMI ANTERIOR APPROACH;  Surgeon: Rod Can, MD;  Location: [facility];  Service: Orthopedics;  Laterality: Right;   Family History  Problem Relation Age of Onset  . Heart attack Mother   . Cancer Brother   . Cancer Brother    Social History  Substance Use Topics  . Smoking status: Former Research scientist (life sciences)  . Smokeless tobacco: Never Used  . Alcohol Use: No   OB History    No data available     Review of Systems  Constitutional: Negative for fever and chills.  Respiratory: Negative for cough, chest tightness and shortness of breath.   Cardiovascular: Negative for chest pain, palpitations and leg swelling.  Gastrointestinal: Positive for abdominal pain, constipation and rectal pain. Negative for nausea, vomiting and diarrhea.  Genitourinary: Negative for dysuria, flank pain and pelvic pain.  Musculoskeletal: Negative for myalgias, arthralgias, neck pain and neck stiffness.  Skin: Negative for rash.  Neurological: Negative for dizziness, weakness and headaches.  All other systems reviewed and are negative.     Allergies  Sulfa antibiotics  Home Medications   Prior to Admission medications   Medication Sig Start Date End Date Taking? Authorizing Provider  acetaminophen (TYLENOL) 500 MG tablet Take 1,000 mg by mouth every 6 (six) hours as needed for  mild pain.   Yes Historical Provider, MD  albuterol (PROVENTIL) (2.5 MG/3ML) 0.083% nebulizer solution Take 3 mLs (2.5 mg total) by nebulization every 4 (four) hours as needed for wheezing or shortness of breath. 06/22/14  Yes Orson Eva, MD  amiodarone (PACERONE) 200 MG tablet Take 200 mg by mouth daily.   Yes Historical Provider, MD  amLODipine (NORVASC) 10 MG tablet Take 10 mg by mouth daily. Take one tablet once  daily to control Blood pressure   Yes Historical Provider, MD  aspirin 81 MG chewable tablet Chew 81 mg by mouth daily.   Yes Historical Provider, MD  cilostazol (PLETAL) 50 MG tablet Take 50 mg by mouth 2 (two) times daily.   Yes Historical Provider, MD  cloNIDine (CATAPRES) 0.1 MG tablet Take 1 tablet (0.1 mg total) by mouth 2 (two) times daily. 10/24/12  Yes Mahima Bubba Camp, MD  gabapentin (NEURONTIN) 100 MG capsule Take 1 capsule (100 mg total) by mouth at bedtime. 06/06/14  Yes Monica Carter, DO  latanoprost (XALATAN) 0.005 % ophthalmic solution Place 1 drop into both eyes daily. Instill 1 drop in each eye once daily for glaucoma.   Yes Historical Provider, MD  metoprolol tartrate (LOPRESSOR) 25 MG tablet Take 12.5 mg by mouth 2 (two) times daily. HOLD if HR under 60   Yes Historical Provider, MD  mirabegron ER (MYRBETRIQ) 25 MG TB24 tablet Take 1 tablet (25 mg total) by mouth daily. 09/24/13  Yes Blanchie Serve, MD  Multiple Vitamins-Minerals (DECUBI-VITE) CAPS Take by mouth daily.   Yes Historical Provider, MD  omeprazole (PRILOSEC) 20 MG capsule Take 1 capsule (20 mg total) by mouth daily. 09/24/13  Yes Mahima Pandey, MD  OVER THE COUNTER MEDICATION Take 1 Dose by mouth 3 (three) times daily. Health Shake after meals   Yes Historical Provider, MD  senna-docusate (SENOKOT-S) 8.6-50 MG per tablet Take 2 tablets by mouth daily as needed for mild constipation.   Yes Historical Provider, MD  Timolol Maleate 0.5 % (DAILY) SOLN Place 1 drop into both eyes every morning.    Yes Historical Provider, MD  topiramate (TOPAMAX) 25 MG tablet Take 1 tablet (25 mg total) by mouth 2 (two) times daily. 11/07/12  Yes Mahima Bubba Camp, MD  traMADol (ULTRAM) 50 MG tablet Take 1 tablet (50 mg total) by mouth every 8 (eight) hours as needed. For pain 01/14/15  Yes [name], MD  AMBULATORY NON FORMULARY MEDICATION Ted compression stockings for both legs- wear them in the morning and remove it before going to bed     Historical Provider, MD  AMBULATORY NON FORMULARY MEDICATION 3-in-1 Commode To prevent pain during toileting task and increase patient safety 08/19/14   Lauree Chandler, NP   BP 172/88 mmHg  Pulse 54  Resp 18  SpO2 98% Physical Exam  Constitutional: She is oriented to person, place, and time. She appears well-developed and well-nourished. No distress.  HENT:  Head: Normocephalic.  Eyes: Conjunctivae are normal.  Neck: Neck supple.  Cardiovascular: Normal rate, regular rhythm and normal heart sounds.   Pulmonary/Chest: Effort normal and breath sounds normal. No respiratory distress. She has no wheezes. She has no rales.  Abdominal: Soft. Bowel sounds are normal. She exhibits no distension. There is no tenderness. There is no rebound.  Mildly distended lower abdomen. TTP in the lower abdomen bilaterally  Genitourinary:  Fecal impaction  Musculoskeletal: She exhibits no edema.  Neurological: She is alert and oriented to person, place, and time.  Skin: Skin is  warm and dry.  Psychiatric: She has a normal mood and affect. Her behavior is normal.  Nursing note and vitals reviewed.   ED Course  Procedures (including critical care time) Labs Review Labs Reviewed - No data to display  Imaging Review Dg Abd 1 View  03/21/2015  CLINICAL DATA:  81 year old female with abdominal pain and constipation for 1 week. EXAM: ABDOMEN - 1 VIEW COMPARISON:  06/17/2014 radiographs FINDINGS: A moderate to large amount of stool within the colon and rectum again noted. There is no evidence of bowel obstruction. No suspicious calcifications are identified. Right hip hemiarthroplasty and degenerative changes within the lumbar spine again noted. IMPRESSION: No evidence of acute abnormality. Moderate to large amount colonic and rectal stool. Electronically Signed   By: Margarette Canada M.D.   On: 03/21/2015 15:07   I have personally reviewed and evaluated these images and lab results as part of my medical  decision-making.   EKG Interpretation None      MDM   Final diagnoses:  Fecal impaction (HCC)  Constipation, unspecified constipation type    Patient with rectal pain, constipation. States she has not had a bowel movement in 1 week. X-ray showing large stool in rectum and colon. I manually disimpacted patient with large bolus of heart stool out and will administer an enema. Patient feels better after disimpaction. Denies pain at this time.  4:46 PM Pt received milk and molases enema with large amount of stool out. Pt feels much better. Abdomen is soft on re examination. Plan to dc home to the nursing home facility. Will start on colace BID.   Filed Vitals:   03/21/15 1439 03/21/15 1639 03/21/15 1915  BP: 172/88 184/85 158/89  Pulse: 54 55 60  Temp: 97.8 F (36.6 C)  97.5 F (36.4 C)  TempSrc: Oral  Oral  Resp: 18 18 20   SpO2: 98% 98% 98%     Jeannett Senior, PA-C 03/22/15 0009  Jeannett Senior, PA-C 03/22/15 0010  Gareth Morgan, MD 03/26/15 1429

## 2015-03-21 NOTE — ED Notes (Signed)
Bed: WA08 Expected date:  Expected time:  Means of arrival:  Comments: EMS - constipation

## 2015-03-21 NOTE — ED Notes (Signed)
Called [facility]- Pt. Is still in queue despite delay in pickup.

## 2015-03-21 NOTE — Discharge Instructions (Signed)
Take colace as prescribed twice a day daily. Follow up with primary care doctor.    Constipation, Adult Constipation is when a person has fewer than three bowel movements a week, has difficulty having a bowel movement, or has stools that are dry, hard, or larger than normal. As people grow older, constipation is more common. A low-fiber diet, not taking in enough fluids, and taking certain medicines may make constipation worse.  CAUSES   Certain medicines, such as antidepressants, pain medicine, iron supplements, antacids, and water pills.   Certain diseases, such as diabetes, irritable bowel syndrome (IBS), thyroid disease, or depression.   Not drinking enough water.   Not eating enough fiber-rich foods.   Stress or travel.   Lack of physical activity or exercise.   Ignoring the urge to have a bowel movement.   Using laxatives too much.  SIGNS AND SYMPTOMS   Having fewer than three bowel movements a week.   Straining to have a bowel movement.   Having stools that are hard, dry, or larger than normal.   Feeling full or bloated.   Pain in the lower abdomen.   Not feeling relief after having a bowel movement.  DIAGNOSIS  Your health care provider will take a medical history and perform a physical exam. Further testing may be done for severe constipation. Some tests may include:  A barium enema X-ray to examine your rectum, colon, and, sometimes, your small intestine.   A sigmoidoscopy to examine your lower colon.   A colonoscopy to examine your entire colon. TREATMENT  Treatment will depend on the severity of your constipation and what is causing it. Some dietary treatments include drinking more fluids and eating more fiber-rich foods. Lifestyle treatments may include regular exercise. If these diet and lifestyle recommendations do not help, your health care provider may recommend taking over-the-counter laxative medicines to help you have bowel movements.  Prescription medicines may be prescribed if over-the-counter medicines do not work.  HOME CARE INSTRUCTIONS   Eat foods that have a lot of fiber, such as fruits, vegetables, whole grains, and beans.  Limit foods high in fat and processed sugars, such as french fries, hamburgers, cookies, candies, and soda.   A fiber supplement may be added to your diet if you cannot get enough fiber from foods.   Drink enough fluids to keep your urine clear or pale yellow.   Exercise regularly or as directed by your health care provider.   Go to the restroom when you have the urge to go. Do not hold it.   Only take over-the-counter or prescription medicines as directed by your health care provider. Do not take other medicines for constipation without talking to your health care provider first.  [place] IF:   You have bright red blood in your stool.   Your constipation lasts for more than 4 days or gets worse.   You have abdominal or rectal pain.   You have thin, pencil-like stools.   You have unexplained weight loss. MAKE SURE YOU:   Understand these instructions.  Will watch your condition.  Will get help right away if you are not doing well or get worse.   This information is not intended to replace advice given to you by your health care provider. Make sure you discuss any questions you have with your health care provider.   Document Released: 12/18/2003 Document Revised: 04/11/2014 Document Reviewed: 12/31/2012 Elsevier Interactive Patient Education [employer].

## 2015-03-21 NOTE — ED Notes (Signed)
Per EMS - patient comes from [facility] [address] [place] with c/o constipation x1 week.  Staff gave patient senna 2 hours PTA to ED with no relief.  Patient also c/o abdominal pain.  Patient denies N/V and fever.  Patient has skin tear right upper arm with dressing to area.  Patient had a hip replacement 6 months ago and is on Tramadol for pain.  Vitals on scene were WNL, 146/70, HR 60, RR 18.

## 2015-03-24 ENCOUNTER — Telehealth: Payer: Self-pay | Admitting: *Deleted

## 2015-03-24 NOTE — Telephone Encounter (Signed)
Pamala Hurry with [facility] called and requested verbal orders for PT evaluation this week. Also faxed orders. Verbal ok given.

## 2015-03-25 DIAGNOSIS — M2351 Chronic instability of knee, right knee: Secondary | ICD-10-CM | POA: Diagnosis not present

## 2015-03-25 DIAGNOSIS — I129 Hypertensive chronic kidney disease with stage 1 through stage 4 chronic kidney disease, or unspecified chronic kidney disease: Secondary | ICD-10-CM | POA: Diagnosis not present

## 2015-03-25 DIAGNOSIS — N183 Chronic kidney disease, stage 3 (moderate): Secondary | ICD-10-CM

## 2015-03-25 DIAGNOSIS — H409 Unspecified glaucoma: Secondary | ICD-10-CM

## 2015-04-07 ENCOUNTER — Emergency Department (HOSPITAL_COMMUNITY): Payer: Medicare Other

## 2015-04-07 ENCOUNTER — Emergency Department (HOSPITAL_COMMUNITY)
Admission: EM | Admit: 2015-04-07 | Discharge: 2015-04-07 | Disposition: A | Discharge status: Home / Self Care | Payer: Medicare Other | Attending: Emergency Medicine | Admitting: Emergency Medicine

## 2015-04-07 DIAGNOSIS — I129 Hypertensive chronic kidney disease with stage 1 through stage 4 chronic kidney disease, or unspecified chronic kidney disease: Secondary | ICD-10-CM | POA: Insufficient documentation

## 2015-04-07 DIAGNOSIS — N3281 Overactive bladder: Secondary | ICD-10-CM | POA: Insufficient documentation

## 2015-04-07 DIAGNOSIS — M150 Primary generalized (osteo)arthritis: Secondary | ICD-10-CM | POA: Insufficient documentation

## 2015-04-07 DIAGNOSIS — Z8701 Personal history of pneumonia (recurrent): Secondary | ICD-10-CM | POA: Insufficient documentation

## 2015-04-07 DIAGNOSIS — F329 Major depressive disorder, single episode, unspecified: Secondary | ICD-10-CM | POA: Diagnosis not present

## 2015-04-07 DIAGNOSIS — N183 Chronic kidney disease, stage 3 (moderate): Secondary | ICD-10-CM | POA: Diagnosis not present

## 2015-04-07 DIAGNOSIS — Z7982 Long term (current) use of aspirin: Secondary | ICD-10-CM | POA: Diagnosis not present

## 2015-04-07 DIAGNOSIS — Y9301 Activity, walking, marching and hiking: Secondary | ICD-10-CM | POA: Insufficient documentation

## 2015-04-07 DIAGNOSIS — G2581 Restless legs syndrome: Secondary | ICD-10-CM | POA: Diagnosis not present

## 2015-04-07 DIAGNOSIS — Z79899 Other long term (current) drug therapy: Secondary | ICD-10-CM | POA: Insufficient documentation

## 2015-04-07 DIAGNOSIS — S32501A Unspecified fracture of right pubis, initial encounter for closed fracture: Secondary | ICD-10-CM | POA: Insufficient documentation

## 2015-04-07 DIAGNOSIS — W1839XA Other fall on same level, initial encounter: Secondary | ICD-10-CM | POA: Insufficient documentation

## 2015-04-07 DIAGNOSIS — S3992XA Unspecified injury of lower back, initial encounter: Secondary | ICD-10-CM | POA: Diagnosis present

## 2015-04-07 DIAGNOSIS — S32010A Wedge compression fracture of first lumbar vertebra, initial encounter for closed fracture: Secondary | ICD-10-CM | POA: Insufficient documentation

## 2015-04-07 DIAGNOSIS — Y92128 Other place in nursing home as the place of occurrence of the external cause: Secondary | ICD-10-CM | POA: Diagnosis not present

## 2015-04-07 DIAGNOSIS — H409 Unspecified glaucoma: Secondary | ICD-10-CM | POA: Insufficient documentation

## 2015-04-07 DIAGNOSIS — Y998 Other external cause status: Secondary | ICD-10-CM | POA: Diagnosis not present

## 2015-04-07 DIAGNOSIS — S32591A Other specified fracture of right pubis, initial encounter for closed fracture: Secondary | ICD-10-CM

## 2015-04-07 DIAGNOSIS — K219 Gastro-esophageal reflux disease without esophagitis: Secondary | ICD-10-CM | POA: Insufficient documentation

## 2015-04-07 DIAGNOSIS — W19XXXA Unspecified fall, initial encounter: Secondary | ICD-10-CM

## 2015-04-07 DIAGNOSIS — Z87891 Personal history of nicotine dependence: Secondary | ICD-10-CM | POA: Diagnosis not present

## 2015-04-07 DIAGNOSIS — S32000A Wedge compression fracture of unspecified lumbar vertebra, initial encounter for closed fracture: Secondary | ICD-10-CM

## 2015-04-07 MED ORDER — TRAMADOL HCL 50 MG PO TABS: 50.0000 mg | ORAL_TABLET | Freq: Four times a day (QID) | ORAL | Status: AC | PRN

## 2015-04-07 MED ORDER — CLONIDINE HCL 0.1 MG PO TABS: 0.1000 mg | ORAL_TABLET | Freq: Once | ORAL | Status: DC

## 2015-04-07 NOTE — ED Notes (Signed)
Patient request for pain medicine for back pain

## 2015-04-07 NOTE — ED Notes (Signed)
Pt denies hitting her head, denies LOC

## 2015-04-07 NOTE — ED Provider Notes (Signed)
CSN: [id number]     Arrival date & time 04/07/15  1718 History   First MD Initiated Contact with Patient 04/07/15 1813     Chief Complaint  Patient presents with  . Fall     (Consider location/radiation/quality/duration/timing/severity/associated sxs/prior Treatment) Patient is a 82 y.o. female presenting with fall. The history is provided by the EMS personnel, the nursing home and the patient. The history is limited by the condition of the patient.  Fall   level V caveat applies due to history of dementia. However patient will answer questions.  The patient states that she stripped when walking according nursing facility was unwitnessed fall she lost her balance patient states headache into the bathroom fell backwards complain of pain to her buttocks and low back pain. According nursing home patient has a known pressure ulcer on sacrum area. Patient denies any head pain or neck pain any belly pain, or chest pain. No complaint of any hip pain or leg pain. No complaints of arm pain.  Past Medical History  Diagnosis Date  . Hypertension   . Memory loss   . GERD (gastroesophageal reflux disease)   . RLS (restless legs syndrome)   . Hypertonicity of bladder   . Renal insufficiency   . Other headache syndromes(339.89)   . Osteoporosis, unspecified   . Urinary frequency   . Accelerated hypertension 07/09/2013  . Stage III chronic kidney disease 06/17/2014  . Hip fracture requiring operative repair ([place]) 06/17/2014  . Primary osteoarthritis involving multiple joints 09/24/2013  . PVD (peripheral vascular disease) ([place]) 11/07/2012  . Glaucoma 11/07/2012  . Depression 11/07/2012  . Overactive bladder 10/17/2012  . Pneumonia    Past Surgical History  Procedure Laterality Date  . Appendectomy  1930  . Rotator cuff repair  1986  . Rotator cuff repair  2001  . Carpal tunnel release  2006    Bilateral   . Total hip arthroplasty Right 06/19/2014    Procedure: RIGHT HIP HEMI ANTERIOR APPROACH;   Surgeon: Rod Can, MD;  Location: [facility];  Service: Orthopedics;  Laterality: Right;   Family History  Problem Relation Age of Onset  . Heart attack Mother   . Cancer Brother   . Cancer Brother    Social History  Substance Use Topics  . Smoking status: Former Research scientist (life sciences)  . Smokeless tobacco: Never Used  . Alcohol Use: No   OB History    No data available     Review of Systems  Unable to perform ROS: Dementia      Allergies  Sulfa antibiotics  Home Medications   Prior to Admission medications   Medication Sig Start Date End Date Taking? Authorizing Provider  acetaminophen (TYLENOL) 500 MG tablet Take 1,000 mg by mouth every 6 (six) hours as needed for mild pain.   Yes Historical Provider, MD  albuterol (PROVENTIL) (2.5 MG/3ML) 0.083% nebulizer solution Take 3 mLs (2.5 mg total) by nebulization every 4 (four) hours as needed for wheezing or shortness of breath. 06/22/14  Yes Orson Eva, MD  AMBULATORY NON FORMULARY MEDICATION Ted compression stockings for both legs- wear them in the morning and remove it before going to bed   Yes Historical Provider, MD  AMBULATORY NON FORMULARY MEDICATION 3-in-1 Commode To prevent pain during toileting task and increase patient safety 08/19/14  Yes Lauree Chandler, NP  amiodarone (PACERONE) 200 MG tablet Take 200 mg by mouth daily.   Yes Historical Provider, MD  amLODipine (NORVASC) 10 MG tablet Take 10 mg  by mouth daily. Take one tablet once daily to control Blood pressure   Yes Historical Provider, MD  aspirin 81 MG chewable tablet Chew 81 mg by mouth daily.   Yes Historical Provider, MD  cilostazol (PLETAL) 50 MG tablet Take 50 mg by mouth 2 (two) times daily.   Yes Historical Provider, MD  cloNIDine (CATAPRES) 0.1 MG tablet Take 1 tablet (0.1 mg total) by mouth 2 (two) times daily. 10/24/12  Yes Mahima Bubba Camp, MD  docusate sodium (COLACE) 250 MG capsule Take 1 capsule (250 mg total) by mouth 2 (two) times daily. 03/21/15  Yes Tatyana  Kirichenko, PA-C  gabapentin (NEURONTIN) 100 MG capsule Take 1 capsule (100 mg total) by mouth at bedtime. 06/06/14  Yes Monica Carter, DO  latanoprost (XALATAN) 0.005 % ophthalmic solution Place 1 drop into both eyes daily. Instill 1 drop in each eye once daily for glaucoma.   Yes Historical Provider, MD  metoprolol tartrate (LOPRESSOR) 25 MG tablet Take 12.5 mg by mouth 2 (two) times daily. HOLD if HR under 60   Yes Historical Provider, MD  mirabegron ER (MYRBETRIQ) 25 MG TB24 tablet Take 1 tablet (25 mg total) by mouth daily. 09/24/13  Yes Blanchie Serve, MD  Multiple Vitamins-Minerals (DECUBI-VITE) CAPS Take by mouth daily.   Yes Historical Provider, MD  neomycin-bacitracin-polymyxin (NEOSPORIN) [phone] ointment Apply 1 application topically daily. To left elbow and right upper ar near shoulder, after cleansing.  Dress with non-adherent dressing and wrap with Kerlex daily.   Yes Historical Provider, MD  Nutritional Supplements (NUTRITIONAL SHAKE) LIQD Take 1 Container by mouth 3 (three) times daily after meals. "Health shake."   Yes Historical Provider, MD  omeprazole (PRILOSEC) 20 MG capsule Take 1 capsule (20 mg total) by mouth daily. 09/24/13  Yes Mahima Pandey, MD  senna-docusate (SENOKOT-S) 8.6-50 MG per tablet Take 2 tablets by mouth daily as needed for mild constipation.   Yes Historical Provider, MD  Timolol Maleate 0.5 % (DAILY) SOLN Place 1 drop into both eyes every morning.    Yes Historical Provider, MD  topiramate (TOPAMAX) 25 MG tablet Take 1 tablet (25 mg total) by mouth 2 (two) times daily. 11/07/12  Yes Mahima Bubba Camp, MD  traMADol (ULTRAM) 50 MG tablet Take 1 tablet (50 mg total) by mouth every 8 (eight) hours as needed. For pain 01/14/15  Yes [name], MD  traMADol (ULTRAM) 50 MG tablet Take 1 tablet (50 mg total) by mouth every 6 (six) hours as needed. 04/07/15   Fredia Sorrow, MD   BP 169/81 mmHg  Pulse 65  Temp(Src) 98.5 F (36.9 C) (Oral)  Resp 17  SpO2 100% Physical  Exam  Constitutional: She is oriented to person, place, and time. She appears well-developed and well-nourished. No distress.  HENT:  Head: Normocephalic and atraumatic.  Eyes: Conjunctivae and EOM are normal. Pupils are equal, round, and reactive to light.  Neck: Normal range of motion.  Cardiovascular: Normal rate, regular rhythm and intact distal pulses.   No murmur heard. Pulmonary/Chest: Effort normal and breath sounds normal.  Abdominal: Soft. Bowel sounds are normal. There is no tenderness.  Musculoskeletal: Normal range of motion. She exhibits tenderness.  Patient with good range of motion of both legs. Pelvis nontender seems stable. Tenderness to palpation upper lumbar area. Patient with a sacral decubitus. Distally cap refill 2 seconds good movement of feet. Patient with good straight leg raise of both legs.  Neurological: She is alert and oriented to person, place, and time. No cranial  nerve deficit. She exhibits normal muscle tone. Coordination normal.  Skin: Skin is warm.  Nursing note and vitals reviewed.   ED Course  Procedures (including critical care time) Labs Review Labs Reviewed - No data to display  Imaging Review Dg Lumbar Spine Complete  04/07/2015  CLINICAL DATA:  Low witnessed fall.  Back pain and hip pain EXAM: LUMBAR SPINE - COMPLETE 4+ VIEW COMPARISON:  CT lumbar spine 06/18/2014 FINDINGS: There is new compression of the L1 vertebral body by approximately 20% compared to CT of 06/18/2014. No retropulsion. No subluxation. There is multiple levels of endplate spurring and joint space narrowing from L2-L5. AP view demonstrates levoscoliosis of the lumbar spine. IMPRESSION: 1. Compression fracture at L1 new from 06/18/2014 with approximately 20% loss vertebral body height. 2. Multilevel disc osteophytic disease from L2 through S1. Electronically Signed   By: Suzy Bouchard M.D.   On: 04/07/2015 19:50   Ct Head Wo Contrast  04/07/2015  CLINICAL DATA:  unwitnessed  fall EXAM: CT HEAD WITHOUT CONTRAST CT CERVICAL SPINE WITHOUT CONTRAST TECHNIQUE: Multidetector CT imaging of the head and cervical spine was performed following the standard protocol without intravenous contrast. Multiplanar CT image reconstructions of the cervical spine were also generated. COMPARISON:  03/15/2015 FINDINGS: CT HEAD FINDINGS No skull fracture is noted. Atherosclerotic calcifications of carotid siphon again noted. Paranasal sinuses and mastoid air cells are unremarkable. No intracranial hemorrhage, mass effect or midline shift. No acute cortical infarction. No mass lesion is noted on this unenhanced scan. Cerebral atrophy is stable. Again noted extensive chronic white matter disease. No definite acute cortical infarction. No mass lesion is noted on this unenhanced scan. CT CERVICAL SPINE FINDINGS Axial images of the cervical spine shows no acute fracture or subluxation. Computer processed images shows diffuse osteopenia. Degenerative changes C1-C2 articulation again noted. Again noted anterior and posterior spurring at C1-C2 level. Stable about 3 mm anterolisthesis C4 on C5 vertebral body. There is disc space flattening with mild anterior spurring at C4-C5 level. Significant disc space flattening and partial bony fusion with mild anterior and posterior spurring at C5-C6 level. Disc space flattening with mild anterior and posterior spurring at C6-C7 level. Stable minimal anterolisthesis and mild anterior spurring C7 on T1 vertebral body. No prevertebral soft tissue swelling. Cervical airway is patent. Again noted mild spinous canal stenosis due to posterior spurring at C5-C6 level. Multilevel facet degenerative changes. There is no pneumothorax in visualized lung apices. Stable scarring in right apex. No prevertebral soft tissue swelling. Cervical airway is patent. IMPRESSION: 1. No acute intracranial abnormality. Stable cerebral atrophy. Extensive chronic white matter disease again noted. No  definite acute cortical infarction. 2. No cervical spine acute fracture or subluxation. Stable mild anterolisthesis C4 on C5 and C7 on T1 vertebral body. Multilevel degenerative changes as described above. Diffuse osteopenia. No prevertebral soft tissue swelling. Cervical airway is patent. Electronically Signed   By: Lahoma Crocker M.D.   On: 04/07/2015 19:28   Ct Cervical Spine Wo Contrast  04/07/2015  CLINICAL DATA:  unwitnessed fall EXAM: CT HEAD WITHOUT CONTRAST CT CERVICAL SPINE WITHOUT CONTRAST TECHNIQUE: Multidetector CT imaging of the head and cervical spine was performed following the standard protocol without intravenous contrast. Multiplanar CT image reconstructions of the cervical spine were also generated. COMPARISON:  03/15/2015 FINDINGS: CT HEAD FINDINGS No skull fracture is noted. Atherosclerotic calcifications of carotid siphon again noted. Paranasal sinuses and mastoid air cells are unremarkable. No intracranial hemorrhage, mass effect or midline shift. No acute cortical infarction. No  mass lesion is noted on this unenhanced scan. Cerebral atrophy is stable. Again noted extensive chronic white matter disease. No definite acute cortical infarction. No mass lesion is noted on this unenhanced scan. CT CERVICAL SPINE FINDINGS Axial images of the cervical spine shows no acute fracture or subluxation. Computer processed images shows diffuse osteopenia. Degenerative changes C1-C2 articulation again noted. Again noted anterior and posterior spurring at C1-C2 level. Stable about 3 mm anterolisthesis C4 on C5 vertebral body. There is disc space flattening with mild anterior spurring at C4-C5 level. Significant disc space flattening and partial bony fusion with mild anterior and posterior spurring at C5-C6 level. Disc space flattening with mild anterior and posterior spurring at C6-C7 level. Stable minimal anterolisthesis and mild anterior spurring C7 on T1 vertebral body. No prevertebral soft tissue swelling.  Cervical airway is patent. Again noted mild spinous canal stenosis due to posterior spurring at C5-C6 level. Multilevel facet degenerative changes. There is no pneumothorax in visualized lung apices. Stable scarring in right apex. No prevertebral soft tissue swelling. Cervical airway is patent. IMPRESSION: 1. No acute intracranial abnormality. Stable cerebral atrophy. Extensive chronic white matter disease again noted. No definite acute cortical infarction. 2. No cervical spine acute fracture or subluxation. Stable mild anterolisthesis C4 on C5 and C7 on T1 vertebral body. Multilevel degenerative changes as described above. Diffuse osteopenia. No prevertebral soft tissue swelling. Cervical airway is patent. Electronically Signed   By: Lahoma Crocker M.D.   On: 04/07/2015 19:28   Dg Hips Bilat With Pelvis 3-4 Views  04/07/2015  CLINICAL DATA:  Fall, bilateral hip pain EXAM: DG HIP (WITH OR WITHOUT PELVIS) 3-4V BILAT COMPARISON:  06/18/2014 FINDINGS: Five views bilateral hip submitted. No acute fracture or subluxation. There is diffuse osteopenia. Right hip prosthesis with anatomic alignment. There is question interval fracture deformity of the right superior pubic ramus adjacent to pubic symphysis. This is of indeterminate age. Clinical correlation is necessary. Moderate stool noted within rectum. IMPRESSION: Limited study by diffuse osteopenia. No definite acute fracture or subluxation. Right hip prosthesis with anatomic alignment. Question interval fracture deformity of the right pubic ramus adjacent to pubic symphysis of indeterminate age. Clinical correlation is necessary. Electronically Signed   By: Lahoma Crocker M.D.   On: 04/07/2015 19:48   I have personally reviewed and evaluated these images and lab results as part of my medical decision-making.   EKG Interpretation None      MDM   Final diagnoses:  Fall, initial encounter  Lumbar compression fracture, closed, initial encounter ([place])  Pubic ramus  fracture, right, closed, initial encounter [facility])    Patient sent in from [place] facility unwitnessed fall patient lost balance and headed to the bathroom. Patient with complaint of back pain patient with a known pressure ulcer on her sacrum. Patient denies any head or neck pain.  Workup shows evidence of a new L1 compression fracture and probable right pubic ramus fracture. CT of head and neck without any acute findings.  Orthopedic referral information provided. Pain control recommended. Both fracture should heal on their own. Patient stable for discharge back to nursing facility.    Fredia Sorrow, MD 04/07/15 2115

## 2015-04-07 NOTE — ED Notes (Signed)
Pt lives at [facility] in [place] and she had a unwitnessed fall, lost balance when headed to bathroom, pt c/o buttox and back pain, pt also has a pressure ulcer to saccrum area

## 2015-04-07 NOTE — ED Notes (Signed)
Bed: WA21 Expected date:  Expected time:  Means of arrival:  Comments: Fall

## 2015-04-07 NOTE — Discharge Instructions (Signed)
Workup for the fall shows evidence of a L1 lumbar compression fracture and a probable right pubic ramus fracture. Both of these will heal neurology but will require pain control. Follow-up with orthopedics is needed referral information provided. Also recommend tramadol for pain. Return for any new or worse symptoms. CT of the head and neck without any acute findings.

## 2015-04-08 ENCOUNTER — Telehealth: Payer: Self-pay | Admitting: *Deleted

## 2015-04-08 NOTE — Telephone Encounter (Signed)
Posey Pronto, nurse with [facility] called with several concerns with patient and patient was in hospital. Nurse will be faxing assessment to provider.

## 2015-04-10 ENCOUNTER — Observation Stay (HOSPITAL_COMMUNITY)
Admission: EM | Admit: 2015-04-10 | Discharge: 2015-04-11 | Disposition: A | Discharge status: Home / Self Care | Payer: Medicare Other | Attending: Internal Medicine | Admitting: Internal Medicine

## 2015-04-10 ENCOUNTER — Emergency Department (HOSPITAL_COMMUNITY): Payer: Medicare Other

## 2015-04-10 ENCOUNTER — Encounter (HOSPITAL_COMMUNITY): Payer: Self-pay | Admitting: Emergency Medicine

## 2015-04-10 DIAGNOSIS — S32018A Other fracture of first lumbar vertebra, initial encounter for closed fracture: Secondary | ICD-10-CM | POA: Diagnosis not present

## 2015-04-10 DIAGNOSIS — R140 Abdominal distension (gaseous): Secondary | ICD-10-CM | POA: Diagnosis not present

## 2015-04-10 DIAGNOSIS — G4489 Other headache syndrome: Secondary | ICD-10-CM | POA: Diagnosis not present

## 2015-04-10 DIAGNOSIS — R945 Abnormal results of liver function studies: Secondary | ICD-10-CM

## 2015-04-10 DIAGNOSIS — N318 Other neuromuscular dysfunction of bladder: Secondary | ICD-10-CM | POA: Insufficient documentation

## 2015-04-10 DIAGNOSIS — K219 Gastro-esophageal reflux disease without esophagitis: Secondary | ICD-10-CM | POA: Insufficient documentation

## 2015-04-10 DIAGNOSIS — R410 Disorientation, unspecified: Secondary | ICD-10-CM | POA: Insufficient documentation

## 2015-04-10 DIAGNOSIS — Z79899 Other long term (current) drug therapy: Secondary | ICD-10-CM | POA: Insufficient documentation

## 2015-04-10 DIAGNOSIS — E876 Hypokalemia: Secondary | ICD-10-CM | POA: Diagnosis not present

## 2015-04-10 DIAGNOSIS — Z8701 Personal history of pneumonia (recurrent): Secondary | ICD-10-CM | POA: Diagnosis not present

## 2015-04-10 DIAGNOSIS — Z87891 Personal history of nicotine dependence: Secondary | ICD-10-CM | POA: Insufficient documentation

## 2015-04-10 DIAGNOSIS — F329 Major depressive disorder, single episode, unspecified: Secondary | ICD-10-CM | POA: Diagnosis not present

## 2015-04-10 DIAGNOSIS — K5900 Constipation, unspecified: Principal | ICD-10-CM | POA: Insufficient documentation

## 2015-04-10 DIAGNOSIS — N3281 Overactive bladder: Secondary | ICD-10-CM | POA: Insufficient documentation

## 2015-04-10 DIAGNOSIS — G2581 Restless legs syndrome: Secondary | ICD-10-CM | POA: Insufficient documentation

## 2015-04-10 DIAGNOSIS — I129 Hypertensive chronic kidney disease with stage 1 through stage 4 chronic kidney disease, or unspecified chronic kidney disease: Secondary | ICD-10-CM | POA: Insufficient documentation

## 2015-04-10 DIAGNOSIS — L8990 Pressure ulcer of unspecified site, unspecified stage: Secondary | ICD-10-CM | POA: Insufficient documentation

## 2015-04-10 DIAGNOSIS — N183 Chronic kidney disease, stage 3 unspecified: Secondary | ICD-10-CM | POA: Diagnosis present

## 2015-04-10 DIAGNOSIS — Z8781 Personal history of (healed) traumatic fracture: Secondary | ICD-10-CM | POA: Diagnosis not present

## 2015-04-10 DIAGNOSIS — I739 Peripheral vascular disease, unspecified: Secondary | ICD-10-CM | POA: Diagnosis not present

## 2015-04-10 DIAGNOSIS — M810 Age-related osteoporosis without current pathological fracture: Secondary | ICD-10-CM | POA: Insufficient documentation

## 2015-04-10 DIAGNOSIS — S32019A Unspecified fracture of first lumbar vertebra, initial encounter for closed fracture: Secondary | ICD-10-CM | POA: Diagnosis present

## 2015-04-10 DIAGNOSIS — Z7982 Long term (current) use of aspirin: Secondary | ICD-10-CM | POA: Insufficient documentation

## 2015-04-10 DIAGNOSIS — I10 Essential (primary) hypertension: Secondary | ICD-10-CM | POA: Diagnosis present

## 2015-04-10 DIAGNOSIS — R109 Unspecified abdominal pain: Secondary | ICD-10-CM | POA: Diagnosis present

## 2015-04-10 DIAGNOSIS — R7989 Other specified abnormal findings of blood chemistry: Secondary | ICD-10-CM | POA: Diagnosis not present

## 2015-04-10 DIAGNOSIS — Z7984 Long term (current) use of oral hypoglycemic drugs: Secondary | ICD-10-CM | POA: Insufficient documentation

## 2015-04-10 LAB — CBC WITH DIFFERENTIAL/PLATELET
Basophils Absolute: 0 10*3/uL (ref 0.0–0.1)
Basophils Relative: 0 %
EOS ABS: 0 10*3/uL (ref 0.0–0.7)
EOS PCT: 0 %
HCT: 44.9 % (ref 36.0–46.0)
HEMOGLOBIN: 15 g/dL (ref 12.0–15.0)
LYMPHS ABS: 0.5 10*3/uL — AB (ref 0.7–4.0)
LYMPHS PCT: 6 %
MCH: 31.9 pg (ref 26.0–34.0)
MCHC: 33.4 g/dL (ref 30.0–36.0)
MCV: 95.5 fL (ref 78.0–100.0)
MONOS PCT: 9 %
Monocytes Absolute: 0.7 10*3/uL (ref 0.1–1.0)
NEUTROS ABS: 6.7 10*3/uL (ref 1.7–7.7)
NEUTROS PCT: 85 %
Platelets: 162 10*3/uL (ref 150–400)
RBC: 4.7 MIL/uL (ref 3.87–5.11)
RDW: 14.2 % (ref 11.5–15.5)
WBC: 8 10*3/uL (ref 4.0–10.5)

## 2015-04-10 LAB — COMPREHENSIVE METABOLIC PANEL
ALBUMIN: 3.9 g/dL (ref 3.5–5.0)
ALK PHOS: 84 U/L (ref 38–126)
ALT: 237 U/L — ABNORMAL HIGH (ref 14–54)
ANION GAP: 10 (ref 5–15)
AST: 210 U/L — ABNORMAL HIGH (ref 15–41)
BILIRUBIN TOTAL: 0.7 mg/dL (ref 0.3–1.2)
BUN: 36 mg/dL — ABNORMAL HIGH (ref 6–20)
CALCIUM: 9.6 mg/dL (ref 8.9–10.3)
CO2: 29 mmol/L (ref 22–32)
Chloride: 106 mmol/L (ref 101–111)
Creatinine, Ser: 1.06 mg/dL — ABNORMAL HIGH (ref 0.44–1.00)
GFR calc non Af Amer: 43 mL/min — ABNORMAL LOW (ref >60)
GFR, EST AFRICAN AMERICAN: 50 mL/min — AB (ref >60)
Glucose, Bld: 151 mg/dL — ABNORMAL HIGH (ref 65–99)
POTASSIUM: 3.2 mmol/L — AB (ref 3.5–5.1)
SODIUM: 145 mmol/L (ref 135–145)
TOTAL PROTEIN: 7.3 g/dL (ref 6.5–8.1)

## 2015-04-10 LAB — URINE MICROSCOPIC-ADD ON

## 2015-04-10 LAB — URINALYSIS, ROUTINE W REFLEX MICROSCOPIC
BILIRUBIN URINE: NEGATIVE
GLUCOSE, UA: NEGATIVE mg/dL
HGB URINE DIPSTICK: NEGATIVE
Ketones, ur: NEGATIVE mg/dL
Nitrite: NEGATIVE
Protein, ur: 30 mg/dL — AB
SPECIFIC GRAVITY, URINE: 1.016 (ref 1.005–1.030)
pH: 7 (ref 5.0–8.0)

## 2015-04-10 LAB — I-STAT CG4 LACTIC ACID, ED: Lactic Acid, Venous: 1.38 mmol/L (ref 0.5–2.0)

## 2015-04-10 LAB — LIPASE, BLOOD: LIPASE: 22 U/L (ref 11–51)

## 2015-04-10 MED ORDER — FENTANYL CITRATE (PF) 100 MCG/2ML IJ SOLN
50.0000 ug | Freq: Once | INTRAMUSCULAR | Status: AC
  Administered 2015-04-10: 50 ug via INTRAVENOUS
  Filled 2015-04-10: qty 2

## 2015-04-10 MED ORDER — CLONIDINE HCL 0.1 MG PO TABS
0.2000 mg | ORAL_TABLET | Freq: Once | ORAL | Status: AC
  Administered 2015-04-10: 0.2 mg via ORAL
  Filled 2015-04-10: qty 2

## 2015-04-10 MED ORDER — AMLODIPINE BESYLATE 10 MG PO TABS
10.0000 mg | ORAL_TABLET | Freq: Every day | ORAL | Status: DC
  Administered 2015-04-10 – 2015-04-11 (×2): 10 mg via ORAL
  Filled 2015-04-10 (×2): qty 1

## 2015-04-10 MED ORDER — METOPROLOL TARTRATE 25 MG PO TABS
12.5000 mg | ORAL_TABLET | Freq: Two times a day (BID) | ORAL | Status: DC
  Administered 2015-04-10: via ORAL
  Administered 2015-04-11: 12.5 mg via ORAL
  Filled 2015-04-10 (×2): qty 1

## 2015-04-10 MED ORDER — MILK AND MOLASSES ENEMA
1.0000 | Freq: Once | RECTAL | Status: AC
  Administered 2015-04-11: 250 mL via RECTAL
  Filled 2015-04-10: qty 250

## 2015-04-10 MED ORDER — POTASSIUM CHLORIDE CRYS ER 20 MEQ PO TBCR
40.0000 meq | EXTENDED_RELEASE_TABLET | Freq: Once | ORAL | Status: AC
  Administered 2015-04-10: 40 meq via ORAL
  Filled 2015-04-10: qty 2

## 2015-04-10 NOTE — H&P (Addendum)
PCP: Lauree Chandler, NP  [facility]  Referring provider Ayesha Rumpf   Chief Complaint:  Back pain and abdominal pain  HPI: Carrie Moran is a 82 y.o. female   has a past medical history of Hypertension; Memory loss; GERD (gastroesophageal reflux disease); RLS (restless legs syndrome); Hypertonicity of bladder; Renal insufficiency; Other headache syndromes(339.89); Osteoporosis, unspecified; Urinary frequency; Accelerated hypertension (07/09/2013); Stage III chronic kidney disease (06/17/2014); Hip fracture requiring operative repair ([place]) (06/17/2014); Primary osteoarthritis involving multiple joints (09/24/2013); PVD (peripheral vascular disease) ([place]) (11/07/2012); Glaucoma (11/07/2012); Depression (11/07/2012); Overactive bladder (10/17/2012); and Pneumonia.   Presented with  Abdominal pain and back pain. Per EMS note nursing home facility noted that she had been having some abdominal distention. Last bowel movement was 2 days ago per nurse's home staff no nausea vomiting or diarrhea. Patient has history of dementia but has been somewhat more disoriented lately. Emergency department she is noncooperative with exam. Appears to be moving all 4 extremities equally.  Patient currently resides in [facility] assisted living. She has history of dementia. 3rd of January patient was seen in the emergency department for a mechanical fall. Patient has been too weak to get out of chair but apparently has attempted and had a fall soon thereafter.  She presented to emergency department with back pain. Plain films showed evidence of L1 compression fracture several pubic rami fracture. She was referred to orthopedics as an outpatient follow-up and discharge back to nursing home facility.  Today CT scan of the abdomen was done showing L1 compression fracture that has not became severe now with associated visual portion of the posterior portion of his vertebral body towards the  central canal causing moderately to severe canal stenosis. Also noted bladder distention and fairly large stool ball within the rectal wall with large amount of stool and gas throughout remainder of the colon. Patient was again found to have right hydronephrosis which is chronic. CT scan today did not show Rami fractures Case has been discussed with the neurosurgeon to this point states that due to examination is comorbidities she is not a candidate for operative intervention on conservative measures including TLCO brace and pain control. At this point no spinal precautions indicated.  Discussed case at length with the son at this point leaning towards comfort care palliative care consult has been ordered. Son would  like [place] to avoid any aggressive interventions and concentrate on comfort and pain management. Patient is DO NOT RESUSCITATE   Hospitalist was called for admission for L1 compression fracture with central canal stenosis possible urinary retention and severe obstipation  Review of Systems:    Pertinent positives include: abdominal pain, confusion  back pain.  Constitutional:  No weight loss, night sweats, Fevers, chills, fatigue, weight loss  HEENT:  No headaches, Difficulty swallowing,Tooth/dental problems,Sore throat,  No sneezing, itching, ear ache, nasal congestion, post nasal drip,  Cardio-vascular:  No chest pain, Orthopnea, PND, anasarca, dizziness, palpitations.no Bilateral lower extremity swelling  GI:  No heartburn, indigestion, nausea, vomiting, diarrhea, change in bowel habits, loss of appetite, melena, blood in stool, hematemesis Resp:  no shortness of breath at rest. No dyspnea on exertion, No excess mucus, no productive cough, No non-productive cough, No coughing up of blood.No change in color of mucus.No wheezing. Skin:  no rash or lesions. No jaundice GU:  no dysuria, change in color of urine, no urgency or frequency. No straining to urinate.  No flank pain.    Musculoskeletal:  No  joint pain or no joint swelling. No decreased range of motion. No Psych:  No change in mood or affect. No depression or anxiety. No memory loss.  Neuro: no localizing neurological complaints, no tingling, no weakness, no double vision, no gait abnormality, no slurred speech,   Otherwise ROS are negative except for above, 10 systems were reviewed  Past Medical History: Past Medical History  Diagnosis Date  . Hypertension   . Memory loss   . GERD (gastroesophageal reflux disease)   . RLS (restless legs syndrome)   . Hypertonicity of bladder   . Renal insufficiency   . Other headache syndromes(339.89)   . Osteoporosis, unspecified   . Urinary frequency   . Accelerated hypertension 07/09/2013  . Stage III chronic kidney disease 06/17/2014  . Hip fracture requiring operative repair ([place]) 06/17/2014  . Primary osteoarthritis involving multiple joints 09/24/2013  . PVD (peripheral vascular disease) ([place]) 11/07/2012  . Glaucoma 11/07/2012  . Depression 11/07/2012  . Overactive bladder 10/17/2012  . Pneumonia    Past Surgical History  Procedure Laterality Date  . Appendectomy  1930  . Rotator cuff repair  1986  . Rotator cuff repair  2001  . Carpal tunnel release  2006    Bilateral   . Total hip arthroplasty Right 06/19/2014    Procedure: RIGHT HIP HEMI ANTERIOR APPROACH;  Surgeon: Rod Can, MD;  Location: [facility];  Service: Orthopedics;  Laterality: Right;     Medications: Prior to Admission medications   Medication Sig Start Date End Date Taking? Authorizing Provider  amiodarone (PACERONE) 200 MG tablet Take 200 mg by mouth daily.   Yes Historical Provider, MD  amLODipine (NORVASC) 10 MG tablet Take 10 mg by mouth daily. Take one tablet once daily to control Blood pressure   Yes Historical Provider, MD  aspirin 81 MG chewable tablet Chew 81 mg by mouth daily.   Yes Historical Provider, MD  cilostazol (PLETAL) 50 MG tablet Take 50 mg by mouth 2 (two) times  daily.   Yes Historical Provider, MD  cloNIDine (CATAPRES) 0.1 MG tablet Take 1 tablet (0.1 mg total) by mouth 2 (two) times daily. 10/24/12  Yes Mahima Bubba Camp, MD  docusate sodium (COLACE) 250 MG capsule Take 1 capsule (250 mg total) by mouth 2 (two) times daily. 03/21/15  Yes Tatyana Kirichenko, PA-C  gabapentin (NEURONTIN) 100 MG capsule Take 1 capsule (100 mg total) by mouth at bedtime. 06/06/14  Yes Monica Carter, DO  latanoprost (XALATAN) 0.005 % ophthalmic solution Place 1 drop into both eyes daily. Instill 1 drop in each eye once daily for glaucoma.   Yes Historical Provider, MD  metoprolol tartrate (LOPRESSOR) 25 MG tablet Take 12.5 mg by mouth 2 (two) times daily. HOLD if HR under 60   Yes Historical Provider, MD  mirabegron ER (MYRBETRIQ) 25 MG TB24 tablet Take 1 tablet (25 mg total) by mouth daily. 09/24/13  Yes Blanchie Serve, MD  Multiple Vitamins-Minerals (DECUBI-VITE) CAPS Take 1 capsule by mouth daily.    Yes Historical Provider, MD  Nutritional Supplements (NUTRITIONAL SHAKE) LIQD Take 1 Container by mouth 3 (three) times daily after meals. "Health shake."   Yes Historical Provider, MD  omeprazole (PRILOSEC) 20 MG capsule Take 1 capsule (20 mg total) by mouth daily. 09/24/13  Yes Mahima Pandey, MD  senna-docusate (SENOKOT-S) 8.6-50 MG per tablet Take 2 tablets by mouth daily as needed for mild constipation.   Yes Historical Provider, MD  Timolol Maleate 0.5 % (DAILY) SOLN Place 1 drop into  both eyes every morning.    Yes Historical Provider, MD  topiramate (TOPAMAX) 25 MG tablet Take 1 tablet (25 mg total) by mouth 2 (two) times daily. 11/07/12  Yes Mahima Bubba Camp, MD  traMADol (ULTRAM) 50 MG tablet Take 1 tablet (50 mg total) by mouth every 6 (six) hours as needed. Patient taking differently: Take 50 mg by mouth every 8 (eight) hours as needed.  04/07/15  Yes Fredia Sorrow, MD  acetaminophen (TYLENOL) 500 MG tablet Take 1,000 mg by mouth every 6 (six) hours as needed for mild pain.     Historical Provider, MD  albuterol (PROVENTIL) (2.5 MG/3ML) 0.083% nebulizer solution Take 3 mLs (2.5 mg total) by nebulization every 4 (four) hours as needed for wheezing or shortness of breath. 06/22/14   Orson Eva, MD  AMBULATORY NON FORMULARY MEDICATION Ted compression stockings for both legs- wear them in the morning and remove it before going to bed    Historical Provider, MD  AMBULATORY NON FORMULARY MEDICATION 3-in-1 Commode To prevent pain during toileting task and increase patient safety 08/19/14   Lauree Chandler, NP  neomycin-bacitracin-polymyxin (NEOSPORIN) [phone] ointment Apply 1 application topically daily. To left elbow and right upper ar near shoulder, after cleansing.  Dress with non-adherent dressing and wrap with Kerlex daily.    Historical Provider, MD  traMADol (ULTRAM) 50 MG tablet Take 1 tablet (50 mg total) by mouth every 8 (eight) hours as needed. For pain Patient not taking: Reported on 04/10/2015 01/14/15   [name], MD    Allergies:   Allergies  Allergen Reactions  . Sulfa Antibiotics     Unknown reaction.     Social History:  Ambulatory wheelchair bound    From facility [facility]   reports that she has quit smoking. She has never used smokeless tobacco. She reports that she does not drink alcohol or use illicit drugs.    Family History: family history includes Cancer in her brother and brother; Heart attack in her mother.    Physical Exam: Patient Vitals for the past 24 hrs:  BP Temp Temp src Pulse Resp SpO2  04/10/15 1904 (!) 222/111 mmHg - - 80 22 94 %  04/10/15 1616 195/94 mmHg 98.1 F (36.7 C) Oral 65 16 95 %    1. General:  in No Acute distress 2. Psychological: Alert but not Oriented 3. Head/ENT:    Dry Mucous Membranes                          Head Non traumatic, neck supple                          Normal  Dentition 4. SKIN:   decreased Skin turgor,  Skin clean Dry and intact no rash 5. Heart: Regular rate and  rhythm no Murmur, Rub or gallop 6. Lungs: Clear to auscultation bilaterally, no wheezes or crackles   7. Abdomen: Soft, non-tender,  distended 8. Lower extremities: no clubbing, cyanosis, or edema 9. Neurologically Grossly intact, moving all 4 extremities equally 10. MSK: Normal range of motion  body mass index is unknown because there is no weight on file.   Labs on Admission:   Results for orders placed or performed during the hospital encounter of 04/10/15 (from the past 24 hour(s))  Comprehensive metabolic panel     Status: Abnormal   Collection Time: 04/10/15  5:12 PM  Result Value Ref  Range   Sodium 145 135 - 145 mmol/L   Potassium 3.2 (L) 3.5 - 5.1 mmol/L   Chloride 106 101 - 111 mmol/L   CO2 29 22 - 32 mmol/L   Glucose, Bld 151 (H) 65 - 99 mg/dL   BUN 36 (H) 6 - 20 mg/dL   Creatinine, Ser 1.06 (H) 0.44 - 1.00 mg/dL   Calcium 9.6 8.9 - 10.3 mg/dL   Total Protein 7.3 6.5 - 8.1 g/dL   Albumin 3.9 3.5 - 5.0 g/dL   AST 210 (H) 15 - 41 U/L   ALT 237 (H) 14 - 54 U/L   Alkaline Phosphatase 84 38 - 126 U/L   Total Bilirubin 0.7 0.3 - 1.2 mg/dL   GFR calc non Af Amer 43 (L) >60 mL/min   GFR calc Af Amer 50 (L) >60 mL/min   Anion gap 10 5 - 15  CBC with Differential     Status: Abnormal   Collection Time: 04/10/15  5:12 PM  Result Value Ref Range   WBC 8.0 4.0 - 10.5 K/uL   RBC 4.70 3.87 - 5.11 MIL/uL   Hemoglobin 15.0 12.0 - 15.0 g/dL   HCT 44.9 36.0 - 46.0 %   MCV 95.5 78.0 - 100.0 fL   MCH 31.9 26.0 - 34.0 pg   MCHC 33.4 30.0 - 36.0 g/dL   RDW 14.2 11.5 - 15.5 %   Platelets 162 150 - 400 K/uL   Neutrophils Relative % 85 %   Neutro Abs 6.7 1.7 - 7.7 K/uL   Lymphocytes Relative 6 %   Lymphs Abs 0.5 (L) 0.7 - 4.0 K/uL   Monocytes Relative 9 %   Monocytes Absolute 0.7 0.1 - 1.0 K/uL   Eosinophils Relative 0 %   Eosinophils Absolute 0.0 0.0 - 0.7 K/uL   Basophils Relative 0 %   Basophils Absolute 0.0 0.0 - 0.1 K/uL  Lipase, blood     Status: None   Collection Time:  04/10/15  5:12 PM  Result Value Ref Range   Lipase 22 11 - 51 U/L  I-Stat CG4 Lactic Acid, ED     Status: None   Collection Time: 04/10/15  5:25 PM  Result Value Ref Range   Lactic Acid, Venous 1.38 0.5 - 2.0 mmol/L  Urinalysis, Routine w reflex microscopic     Status: Abnormal   Collection Time: 04/10/15  6:38 PM  Result Value Ref Range   Color, Urine YELLOW YELLOW   APPearance TURBID (A) CLEAR   Specific Gravity, Urine 1.016 1.005 - 1.030   pH 7.0 5.0 - 8.0   Glucose, UA NEGATIVE NEGATIVE mg/dL   Hgb urine dipstick NEGATIVE NEGATIVE   Bilirubin Urine NEGATIVE NEGATIVE   Ketones, ur NEGATIVE NEGATIVE mg/dL   Protein, ur 30 (A) NEGATIVE mg/dL   Nitrite NEGATIVE NEGATIVE   Leukocytes, UA SMALL (A) NEGATIVE  Urine microscopic-add on     Status: Abnormal   Collection Time: 04/10/15  6:38 PM  Result Value Ref Range   Squamous Epithelial / LPF 6-30 (A) NONE SEEN   WBC, UA 0-5 0 - 5 WBC/hpf   RBC / HPF 0-5 0 - 5 RBC/hpf   Bacteria, UA FEW (A) NONE SEEN   Urine-Other AMORPHOUS URATES/PHOSPHATES     UA no evidence of UTI   No results found for: HGBA1C  CrCl cannot be calculated (Unknown ideal weight.).  BNP (last 3 results) No results for input(s): PROBNP in the last 8760 hours.  Other results:  I  have pearsonaly reviewed this: ECG REPORT  Rate75  Rhythm: Sinus rhythm and left fascicular block ST&T Change: no ischemic changes QTC 475  There were no vitals filed for this visit.   Cultures:    Component Value Date/Time   SDES URINE, RANDOM 09/24/2014 0940   SPECREQUEST NONE 09/24/2014 0940   CULT  09/24/2014 0940    >=100,000 COLONIES/mL STAPHYLOCOCCUS SPECIES (COAGULASE NEGATIVE)   REPTSTATUS 09/29/2014 FINAL 09/24/2014 0940     Radiological Exams on Admission: Ct Abdomen Pelvis Wo Contrast  04/10/2015  CLINICAL DATA:  Distended abdomen, disoriented. History of recent fall with L1 compression fracture and pubic ramus fracture. EXAM: CT ABDOMEN AND PELVIS WITHOUT  CONTRAST TECHNIQUE: Multidetector CT imaging of the abdomen and pelvis was performed following the standard protocol without IV contrast. COMPARISON:  CT abdomen dated 06/18/2014. FINDINGS: Patient is status post right hip arthroplasty. Hardware appears intact and well positioned. No surgical complicating features seen. The L1 compression fracture is now severe, only mild on plain film of 04/07/2015. There is associated retropulsion of the posterior portion of the vertebral body towards the central canal causing moderately severe central canal stenosis. Bladder is distended, at least moderate in degree. There is a chronic right hydronephrosis, most likely related to a chronic right UPJ obstruction. 2 mm nonobstructing left renal stone. 2.5 cm gallstone within the fundus of the mildly distended gallbladder. Liver and spleen are unremarkable. Pancreas not well seen but grossly unremarkable. Fairly large stool ball within the rectal vault. Fairly large amount of stool and gas throughout the remainder of the colon which is of normal caliber. No small bowel dilatation. No free fluid or hemorrhage within the abdomen or pelvis. No free intraperitoneal air. There is a small right pleural effusion with adjacent atelectasis. Lung bases otherwise clear. There is cardiomegaly, incompletely imaged, similar to previous exam. Extensive atherosclerotic changes are seen throughout the normal- caliber abdominal aorta and branch vessels. Degenerative changes are seen throughout the scoliotic thoracolumbar spine. IMPRESSION: 1. L1 compression fracture deformity is now severe, was only mild on recent plain film of 04/07/2015, significantly progressed in the interval. No fracture involvement of the posterior elements. There is now associated retropulsion of the posterior portion of this vertebral body towards the central canal causing a moderately severe central canal stenosis. 2. Status post right hip arthroplasty for a femoral neck  fracture identified on CT of 06/18/2014. 3. Bladder is distended, at least moderate in degree. 4. Cholelithiasis without evidence of acute cholecystitis. 5. Chronic right hydronephrosis, most likely related to a chronic UPJ obstruction. 2 mm nonobstructing left renal stone. 6. Moderate to large stool ball in the rectal vault, similar to previous CT (some degree of impaction?). Fairly large amount of stool and gas throughout the remainder of the colon. No evidence of bowel obstruction. These results were called by telephone at the time of interpretation on 04/10/2015 at 8:00 pm to Dr. Quintella Reichert , who verbally acknowledged these results. Electronically Signed   By: Franki Cabot M.D.   On: 04/10/2015 20:01    Chart has been reviewed  Family not at  Bedside  plan of care was discussed with  Son Kaizleigh Barrios (MPOA) over the [phone]  Assessment/Plan  82 year old female with history of PVD, stage III chronic kidney disease, hypertension poorly controlled, decubitus ulcer presents with abdominal pain  and back pain was found to have worsening of L1 fracture now resulting in spinal canal stenosis per neurosurgery nonoperative as well as obstipation and urinary retention  Present on Admission:  . Closed L1 vertebral fracture (HCC) pain management, ordered brace unfortunately not an operative candidate  . PVD (peripheral vascular disease) (HCC) chronic stable continue Pletal  . Stage III chronic kidney disease chronic stable continue to monitor  . Hypokalemia will replace check magnesium level   . Hypertension poorly controlled will resume home medications at this point patient's family is leaning towards palliative care  . Elevated LFTs patient had this in the past but resolved. Will order ultrasound of abdomen to evaluate for any gallbladder abnormality, we'll hold amiodarone  . Obstipation order milk and molasses enema repeat as needed repeat KUB in the morning Urinary retention has  ordered foley catheter Street of atrial fibrillation currently continue metoprolol. Amiodarone given LFT abnormalities. Currently rate controlled Prophylaxis: SCD  CODE STATUS: DNR/DNI as per family is leaning towards comfort care at this point would like [place] to concentrate on pain management and avoid aggressive interventions interested in palliative care consult  Disposition: Back to current facility when stable                       Other plan as per orders.  I have spent a total of 65 min on this admission  Tramel Westbrook 04/10/2015, 11:38 PM    [facility]  Pager [phone]   after 2 AM please page floor coverage PA If 7AM-7PM, please contact the day team taking care of the patient  [email or web address]  Password [email or web address]

## 2015-04-10 NOTE — ED Notes (Signed)
Bed: [facility] Expected date:  Expected time:  Means of arrival:  Comments: No bed 

## 2015-04-10 NOTE — ED Notes (Signed)
Place PT on bedpan, PT c/o back pain request for bedpan to be remove

## 2015-04-10 NOTE — ED Notes (Signed)
Bed: WA09 Expected date:  Expected time:  Means of arrival:  Comments: EMS- 82 yo AMS

## 2015-04-10 NOTE — ED Notes (Signed)
Nurse drawing labs. 

## 2015-04-10 NOTE — ED Notes (Signed)
Pt able to urinate on a bedpan---- voided approximately 200 ml of amber urine.

## 2015-04-10 NOTE — ED Provider Notes (Signed)
CSN: [id number]     Arrival date & time 04/10/15  1555 History   First MD Initiated Contact with Patient 04/10/15 1628     Chief Complaint  Patient presents with  . Abdominal Pain  . Altered Mental Status     Patient is a 82 y.o. female presenting with abdominal pain and altered mental status. The history is provided by the patient. No language interpreter was used.  Abdominal Pain Altered Mental Status Associated symptoms: abdominal pain    Carrie Moran is a 82 y.o. female who presents to the Emergency Department complaining of abdominal pain. Level 5 caveat due to confusion. She presents from [facility] for abdominal pain. Patient states she has had pain for months and she vomited once one time in the hospital. Per report nursing noticed distended abdomen this morning. Upon discussion with her son he reports that she has had problems since she a had fall a few days ago.   Past Medical History  Diagnosis Date  . Hypertension   . Memory loss   . GERD (gastroesophageal reflux disease)   . RLS (restless legs syndrome)   . Hypertonicity of bladder   . Renal insufficiency   . Other headache syndromes(339.89)   . Osteoporosis, unspecified   . Urinary frequency   . Accelerated hypertension 07/09/2013  . Stage III chronic kidney disease 06/17/2014  . Hip fracture requiring operative repair ([place]) 06/17/2014  . Primary osteoarthritis involving multiple joints 09/24/2013  . PVD (peripheral vascular disease) ([place]) 11/07/2012  . Glaucoma 11/07/2012  . Depression 11/07/2012  . Overactive bladder 10/17/2012  . Pneumonia    Past Surgical History  Procedure Laterality Date  . Appendectomy  1930  . Rotator cuff repair  1986  . Rotator cuff repair  2001  . Carpal tunnel release  2006    Bilateral   . Total hip arthroplasty Right 06/19/2014    Procedure: RIGHT HIP HEMI ANTERIOR APPROACH;  Surgeon: Rod Can, MD;  Location: [facility];  Service: Orthopedics;  Laterality: Right;   Family History   Problem Relation Age of Onset  . Heart attack Mother   . Cancer Brother   . Cancer Brother    Social History  Substance Use Topics  . Smoking status: Former Research scientist (life sciences)  . Smokeless tobacco: Never Used  . Alcohol Use: No   OB History    No data available     Review of Systems  Gastrointestinal: Positive for abdominal pain.  All other systems reviewed and are negative.     Allergies  Sulfa antibiotics  Home Medications   Prior to Admission medications   Medication Sig Start Date End Date Taking? Authorizing Provider  amiodarone (PACERONE) 200 MG tablet Take 200 mg by mouth daily.   Yes Historical Provider, MD  amLODipine (NORVASC) 10 MG tablet Take 10 mg by mouth daily. Take one tablet once daily to control Blood pressure   Yes Historical Provider, MD  aspirin 81 MG chewable tablet Chew 81 mg by mouth daily.   Yes Historical Provider, MD  cilostazol (PLETAL) 50 MG tablet Take 50 mg by mouth 2 (two) times daily.   Yes Historical Provider, MD  cloNIDine (CATAPRES) 0.1 MG tablet Take 1 tablet (0.1 mg total) by mouth 2 (two) times daily. 10/24/12  Yes Mahima Bubba Camp, MD  docusate sodium (COLACE) 250 MG capsule Take 1 capsule (250 mg total) by mouth 2 (two) times daily. 03/21/15  Yes Tatyana Kirichenko, PA-C  gabapentin (NEURONTIN) 100 MG capsule Take 1 capsule (  100 mg total) by mouth at bedtime. 06/06/14  Yes Monica Carter, DO  latanoprost (XALATAN) 0.005 % ophthalmic solution Place 1 drop into both eyes daily. Instill 1 drop in each eye once daily for glaucoma.   Yes Historical Provider, MD  metoprolol tartrate (LOPRESSOR) 25 MG tablet Take 12.5 mg by mouth 2 (two) times daily. HOLD if HR under 60   Yes Historical Provider, MD  mirabegron ER (MYRBETRIQ) 25 MG TB24 tablet Take 1 tablet (25 mg total) by mouth daily. 09/24/13  Yes Blanchie Serve, MD  Multiple Vitamins-Minerals (DECUBI-VITE) CAPS Take 1 capsule by mouth daily.    Yes Historical Provider, MD  Nutritional Supplements  (NUTRITIONAL SHAKE) LIQD Take 1 Container by mouth 3 (three) times daily after meals. "Health shake."   Yes Historical Provider, MD  omeprazole (PRILOSEC) 20 MG capsule Take 1 capsule (20 mg total) by mouth daily. 09/24/13  Yes Mahima Pandey, MD  senna-docusate (SENOKOT-S) 8.6-50 MG per tablet Take 2 tablets by mouth daily as needed for mild constipation.   Yes Historical Provider, MD  Timolol Maleate 0.5 % (DAILY) SOLN Place 1 drop into both eyes every morning.    Yes Historical Provider, MD  topiramate (TOPAMAX) 25 MG tablet Take 1 tablet (25 mg total) by mouth 2 (two) times daily. 11/07/12  Yes Mahima Bubba Camp, MD  traMADol (ULTRAM) 50 MG tablet Take 1 tablet (50 mg total) by mouth every 6 (six) hours as needed. Patient taking differently: Take 50 mg by mouth every 8 (eight) hours as needed.  04/07/15  Yes Fredia Sorrow, MD  acetaminophen (TYLENOL) 500 MG tablet Take 1,000 mg by mouth every 6 (six) hours as needed for mild pain.    Historical Provider, MD  albuterol (PROVENTIL) (2.5 MG/3ML) 0.083% nebulizer solution Take 3 mLs (2.5 mg total) by nebulization every 4 (four) hours as needed for wheezing or shortness of breath. 06/22/14   Orson Eva, MD  AMBULATORY NON FORMULARY MEDICATION Ted compression stockings for both legs- wear them in the morning and remove it before going to bed    Historical Provider, MD  AMBULATORY NON FORMULARY MEDICATION 3-in-1 Commode To prevent pain during toileting task and increase patient safety 08/19/14   Lauree Chandler, NP  neomycin-bacitracin-polymyxin (NEOSPORIN) [phone] ointment Apply 1 application topically daily. To left elbow and right upper ar near shoulder, after cleansing.  Dress with non-adherent dressing and wrap with Kerlex daily.    Historical Provider, MD  traMADol (ULTRAM) 50 MG tablet Take 1 tablet (50 mg total) by mouth every 8 (eight) hours as needed. For pain Patient not taking: Reported on 04/10/2015 01/14/15   [name], MD   BP 152/73 mmHg   Pulse 65  Temp(Src) 98.1 F (36.7 C) (Oral)  Resp 18  Ht 5\' 3"  (1.6 m)  Wt 84 lb (38.102 kg)  BMI 14.88 kg/m2  SpO2 94% Physical Exam  Constitutional: She appears well-developed and well-nourished.  HENT:  Head: Normocephalic and atraumatic.  Cardiovascular: Normal rate and regular rhythm.   No murmur heard. Pulmonary/Chest: Effort normal and breath sounds normal. No respiratory distress.  Abdominal:  Moderate abdominal distention with diffuse tenderness to palpation. Decreased bowel sounds.  Musculoskeletal: She exhibits no edema or tenderness.  Neurological: She is alert.  Patient is oriented to hospital but when asked what year it is she states 3. Confused. 5/5 LLE strength 4+ to 5/5 RLE (pt states hip pain limits her strength in right leg.  Sensation to light touch intact throughout bilateral lower  extremities.  No saddle anesthesia.    Skin: Skin is warm and dry.  Psychiatric: She has a normal mood and affect. Her behavior is normal.  Nursing note and vitals reviewed.   ED Course  Procedures (including critical care time) Labs Review Labs Reviewed  COMPREHENSIVE METABOLIC PANEL - Abnormal; Notable for the following:    Potassium 3.2 (*)    Glucose, Bld 151 (*)    BUN 36 (*)    Creatinine, Ser 1.06 (*)    AST 210 (*)    ALT 237 (*)    GFR calc non Af Amer 43 (*)    GFR calc Af Amer 50 (*)    All other components within normal limits  CBC WITH DIFFERENTIAL/PLATELET - Abnormal; Notable for the following:    Lymphs Abs 0.5 (*)    All other components within normal limits  URINALYSIS, ROUTINE W REFLEX MICROSCOPIC (NOT AT [facility]) - Abnormal; Notable for the following:    APPearance TURBID (*)    Protein, ur 30 (*)    Leukocytes, UA SMALL (*)    All other components within normal limits  URINE MICROSCOPIC-ADD ON - Abnormal; Notable for the following:    Squamous Epithelial / LPF 6-30 (*)    Bacteria, UA FEW (*)    All other components within normal limits  URINE CULTURE   LIPASE, BLOOD  I-STAT CG4 LACTIC ACID, ED    Imaging Review Ct Abdomen Pelvis Wo Contrast  04/10/2015  CLINICAL DATA:  Distended abdomen, disoriented. History of recent fall with L1 compression fracture and pubic ramus fracture. EXAM: CT ABDOMEN AND PELVIS WITHOUT CONTRAST TECHNIQUE: Multidetector CT imaging of the abdomen and pelvis was performed following the standard protocol without IV contrast. COMPARISON:  CT abdomen dated 06/18/2014. FINDINGS: Patient is status post right hip arthroplasty. Hardware appears intact and well positioned. No surgical complicating features seen. The L1 compression fracture is now severe, only mild on plain film of 04/07/2015. There is associated retropulsion of the posterior portion of the vertebral body towards the central canal causing moderately severe central canal stenosis. Bladder is distended, at least moderate in degree. There is a chronic right hydronephrosis, most likely related to a chronic right UPJ obstruction. 2 mm nonobstructing left renal stone. 2.5 cm gallstone within the fundus of the mildly distended gallbladder. Liver and spleen are unremarkable. Pancreas not well seen but grossly unremarkable. Fairly large stool ball within the rectal vault. Fairly large amount of stool and gas throughout the remainder of the colon which is of normal caliber. No small bowel dilatation. No free fluid or hemorrhage within the abdomen or pelvis. No free intraperitoneal air. There is a small right pleural effusion with adjacent atelectasis. Lung bases otherwise clear. There is cardiomegaly, incompletely imaged, similar to previous exam. Extensive atherosclerotic changes are seen throughout the normal- caliber abdominal aorta and branch vessels. Degenerative changes are seen throughout the scoliotic thoracolumbar spine. IMPRESSION: 1. L1 compression fracture deformity is now severe, was only mild on recent plain film of 04/07/2015, significantly progressed in the interval. No  fracture involvement of the posterior elements. There is now associated retropulsion of the posterior portion of this vertebral body towards the central canal causing a moderately severe central canal stenosis. 2. Status post right hip arthroplasty for a femoral neck fracture identified on CT of 06/18/2014. 3. Bladder is distended, at least moderate in degree. 4. Cholelithiasis without evidence of acute cholecystitis. 5. Chronic right hydronephrosis, most likely related to a chronic UPJ obstruction. 2  mm nonobstructing left renal stone. 6. Moderate to large stool ball in the rectal vault, similar to previous CT (some degree of impaction?). Fairly large amount of stool and gas throughout the remainder of the colon. No evidence of bowel obstruction. These results were called by telephone at the time of interpretation on 04/10/2015 at 8:00 pm to Dr. Quintella Reichert , who verbally acknowledged these results. Electronically Signed   By: Franki Cabot M.D.   On: 04/10/2015 20:01   I have personally reviewed and evaluated these images and lab results as part of my medical decision-making.   EKG Interpretation   Date/Time:  Friday April 10 2015 16:53:51 EST Ventricular Rate:  72 PR Interval:  201 QRS Duration: 105 QT Interval:  407 QTC Calculation: 445 R Axis:   -57 Text Interpretation:  Sinus rhythm Atrial premature complexes Probable  left atrial enlargement Left anterior fascicular block Anterior infarct,  old Confirmed by Hazle Coca [phone]) on 04/10/2015 4:58:15 PM      MDM   Final diagnoses:  None   Pt here for lower abdominal pain/distension, back pain.  Pt distended and tender on initial examination.  Labs demonstrate elevation in transaminases of unclear significance.  CT scan demonstrates significant worsening of her L1  fracture.  Pt's neurologic examination is slightly limited due to her dementia and baseline confusion.  D/w Dr. Cyndy Freeze with Neurosurgery findings of CT scan and exam -  recommends TLSO.  D/w hospitalst regarding admission for further evaluation of abdominal pain, elevation in transaminases.  D/w pt's son over the phone the findings of studies and recommendation for admission for further evaluation and he is in agreement with plan.      Quintella Reichert, MD 2015-05-05 [phone]

## 2015-04-10 NOTE — ED Notes (Signed)
Per EMS. Pt from [facility]. Staff noticed pt had a distended abd this am. LBM 2 days ago. No n/v/d. EMS also noted pt was disoriented to time which is unusual for her. Per nursing facility paperwork, pt had a fall on 1/3 resulting in a L1 compression and pubic ramus fracture. Pt denies any pain at this time.

## 2015-04-10 NOTE — ED Notes (Addendum)
Phelbotomy walked past pt. Room and found pt. In between bed rails, pt. Pulled out IV and gauze and tape applied.Pt. Cleaned up and bed linen changed. Pt. Moved to hall bed to be observed. CN, Autumn made aware.

## 2015-04-11 ENCOUNTER — Observation Stay (HOSPITAL_COMMUNITY): Payer: Medicare Other

## 2015-04-11 DIAGNOSIS — R7989 Other specified abnormal findings of blood chemistry: Secondary | ICD-10-CM

## 2015-04-11 DIAGNOSIS — K5900 Constipation, unspecified: Secondary | ICD-10-CM

## 2015-04-11 DIAGNOSIS — L8990 Pressure ulcer of unspecified site, unspecified stage: Secondary | ICD-10-CM | POA: Insufficient documentation

## 2015-04-11 DIAGNOSIS — I739 Peripheral vascular disease, unspecified: Secondary | ICD-10-CM

## 2015-04-11 LAB — CBC
HCT: 40.2 % (ref 36.0–46.0)
HEMOGLOBIN: 12.7 g/dL (ref 12.0–15.0)
MCH: 31.1 pg (ref 26.0–34.0)
MCHC: 31.6 g/dL (ref 30.0–36.0)
MCV: 98.5 fL (ref 78.0–100.0)
Platelets: 165 10*3/uL (ref 150–400)
RBC: 4.08 MIL/uL (ref 3.87–5.11)
RDW: 14.4 % (ref 11.5–15.5)
WBC: 8.7 10*3/uL (ref 4.0–10.5)

## 2015-04-11 LAB — COMPREHENSIVE METABOLIC PANEL
ALBUMIN: 3.5 g/dL (ref 3.5–5.0)
ALK PHOS: 71 U/L (ref 38–126)
ALT: 209 U/L — AB (ref 14–54)
ANION GAP: 10 (ref 5–15)
AST: 168 U/L — ABNORMAL HIGH (ref 15–41)
BUN: 26 mg/dL — ABNORMAL HIGH (ref 6–20)
CALCIUM: 8.6 mg/dL — AB (ref 8.9–10.3)
CHLORIDE: 107 mmol/L (ref 101–111)
CO2: 27 mmol/L (ref 22–32)
Creatinine, Ser: 0.79 mg/dL (ref 0.44–1.00)
GFR calc Af Amer: >60 mL/min (ref >60)
GFR calc non Af Amer: >60 mL/min (ref >60)
GLUCOSE: 119 mg/dL — AB (ref 65–99)
Potassium: 3.7 mmol/L (ref 3.5–5.1)
SODIUM: 144 mmol/L (ref 135–145)
Total Bilirubin: 0.8 mg/dL (ref 0.3–1.2)
Total Protein: 6.3 g/dL — ABNORMAL LOW (ref 6.5–8.1)

## 2015-04-11 LAB — PHOSPHORUS: Phosphorus: 2.5 mg/dL (ref 2.5–4.6)

## 2015-04-11 LAB — MAGNESIUM: MAGNESIUM: 1.8 mg/dL (ref 1.7–2.4)

## 2015-04-11 LAB — MRSA PCR SCREENING: MRSA by PCR: POSITIVE — AB

## 2015-04-11 LAB — TSH: TSH: 26.941 u[IU]/mL — AB (ref 0.350–4.500)

## 2015-04-11 MED ORDER — ONDANSETRON HCL 4 MG/2ML IJ SOLN: 4.0000 mg | Freq: Four times a day (QID) | INTRAMUSCULAR | Status: DC | PRN

## 2015-04-11 MED ORDER — CHLORHEXIDINE GLUCONATE CLOTH 2 % EX PADS
6.0000 | MEDICATED_PAD | Freq: Every day | CUTANEOUS | Status: DC
  Administered 2015-04-11: 6 via TOPICAL

## 2015-04-11 MED ORDER — SODIUM CHLORIDE 0.9 % IV SOLN
INTRAVENOUS | Status: AC
  Administered 2015-04-11: 02:00:00 via INTRAVENOUS

## 2015-04-11 MED ORDER — MIRABEGRON ER 25 MG PO TB24
25.0000 mg | ORAL_TABLET | Freq: Every day | ORAL | Status: DC
  Administered 2015-04-11: 25 mg via ORAL
  Filled 2015-04-11: qty 1

## 2015-04-11 MED ORDER — MORPHINE SULFATE (PF) 2 MG/ML IV SOLN
INTRAVENOUS | Status: AC
  Filled 2015-04-11: qty 1

## 2015-04-11 MED ORDER — TOPIRAMATE 25 MG PO TABS
25.0000 mg | ORAL_TABLET | Freq: Two times a day (BID) | ORAL | Status: DC
  Administered 2015-04-11 (×2): 25 mg via ORAL
  Filled 2015-04-11 (×3): qty 1

## 2015-04-11 MED ORDER — MUPIROCIN 2 % EX OINT
1.0000 "application " | TOPICAL_OINTMENT | Freq: Two times a day (BID) | CUTANEOUS | Status: DC
  Administered 2015-04-11 (×2): 1 via NASAL
  Filled 2015-04-11 (×2): qty 22

## 2015-04-11 MED ORDER — POLYETHYLENE GLYCOL 3350 17 G PO PACK: 17.0000 g | PACK | Freq: Every day | ORAL | Status: DC

## 2015-04-11 MED ORDER — MORPHINE SULFATE (PF) 2 MG/ML IV SOLN
2.0000 mg | INTRAVENOUS | Status: DC | PRN
  Administered 2015-04-11: 2 mg via INTRAVENOUS

## 2015-04-11 MED ORDER — GABAPENTIN 100 MG PO CAPS
100.0000 mg | ORAL_CAPSULE | Freq: Every day | ORAL | Status: DC
  Administered 2015-04-11: 100 mg via ORAL
  Filled 2015-04-11: qty 1

## 2015-04-11 MED ORDER — CILOSTAZOL 50 MG PO TABS
50.0000 mg | ORAL_TABLET | Freq: Two times a day (BID) | ORAL | Status: DC
  Administered 2015-04-11 (×2): 50 mg via ORAL
  Filled 2015-04-11 (×3): qty 1

## 2015-04-11 MED ORDER — LATANOPROST 0.005 % OP SOLN
1.0000 [drp] | Freq: Every day | OPHTHALMIC | Status: DC
  Administered 2015-04-11: 1 [drp] via OPHTHALMIC
  Filled 2015-04-11: qty 2.5

## 2015-04-11 MED ORDER — CETYLPYRIDINIUM CHLORIDE 0.05 % MT LIQD
7.0000 mL | Freq: Two times a day (BID) | OROMUCOSAL | Status: DC
  Administered 2015-04-11: 7 mL via OROMUCOSAL

## 2015-04-11 MED ORDER — TIMOLOL MALEATE 0.5 % OP SOLN
1.0000 [drp] | Freq: Every morning | OPHTHALMIC | Status: DC
  Administered 2015-04-11: 1 [drp] via OPHTHALMIC
  Filled 2015-04-11: qty 5

## 2015-04-11 MED ORDER — PANTOPRAZOLE SODIUM 40 MG PO TBEC
40.0000 mg | DELAYED_RELEASE_TABLET | Freq: Every day | ORAL | Status: DC
  Administered 2015-04-11: 40 mg via ORAL
  Filled 2015-04-11: qty 1

## 2015-04-11 MED ORDER — TRAMADOL HCL 50 MG PO TABS
50.0000 mg | ORAL_TABLET | Freq: Four times a day (QID) | ORAL | Status: DC | PRN
Start: 2015-04-11 — End: 2015-04-11

## 2015-04-11 MED ORDER — ASPIRIN 81 MG PO CHEW
81.0000 mg | CHEWABLE_TABLET | Freq: Every day | ORAL | Status: DC
  Administered 2015-04-11: 81 mg via ORAL
  Filled 2015-04-11: qty 1

## 2015-04-11 MED ORDER — HYDROCODONE-ACETAMINOPHEN 5-325 MG PO TABS
1.0000 | ORAL_TABLET | ORAL | Status: DC | PRN
  Filled 2015-04-11: qty 1

## 2015-04-11 MED ORDER — METHOCARBAMOL 500 MG PO TABS: 500.0000 mg | ORAL_TABLET | Freq: Three times a day (TID) | ORAL | Status: DC | PRN

## 2015-04-11 MED ORDER — DOCUSATE SODIUM 50 MG PO CAPS
250.0000 mg | ORAL_CAPSULE | Freq: Two times a day (BID) | ORAL | Status: DC
  Administered 2015-04-11: 250 mg via ORAL
  Filled 2015-04-11 (×2): qty 1

## 2015-04-11 MED ORDER — CLONIDINE HCL 0.1 MG PO TABS
0.1000 mg | ORAL_TABLET | Freq: Two times a day (BID) | ORAL | Status: DC
  Administered 2015-04-11 (×2): 0.1 mg via ORAL
  Filled 2015-04-11 (×2): qty 1

## 2015-04-11 MED ORDER — METHOCARBAMOL 1000 MG/10ML IJ SOLN: 500.0000 mg | Freq: Three times a day (TID) | INTRAVENOUS | Status: DC | PRN

## 2015-04-11 MED ORDER — ACETAMINOPHEN 650 MG RE SUPP: 650.0000 mg | Freq: Four times a day (QID) | RECTAL | Status: DC | PRN

## 2015-04-11 MED ORDER — ALBUTEROL SULFATE (2.5 MG/3ML) 0.083% IN NEBU: 2.5000 mg | INHALATION_SOLUTION | RESPIRATORY_TRACT | Status: DC | PRN

## 2015-04-11 MED ORDER — ONDANSETRON HCL 4 MG PO TABS: 4.0000 mg | ORAL_TABLET | Freq: Four times a day (QID) | ORAL | Status: DC | PRN

## 2015-04-11 MED ORDER — ACETAMINOPHEN 325 MG PO TABS: 650.0000 mg | ORAL_TABLET | Freq: Four times a day (QID) | ORAL | Status: DC | PRN

## 2015-04-12 LAB — URINE CULTURE

## 2015-05-06 NOTE — Discharge Summary (Signed)
Death Summary  Carrie Moran [id number] DOB: 1917/10/29 DOA: 2015/04/22  PCP: Lauree Chandler, NP PCP/Office notified:called  Admit date: Apr 22, 2015 Date of Death: Apr 23, 2015  Final Diagnoses:  Active Problems:   PVD (peripheral vascular disease) (HCC)   Stage III chronic kidney disease   Hypokalemia   Closed L1 vertebral fracture (HCC)   Hypertension   Elevated LFTs   Obstipation   Pressure ulcer   History of present illness:  82 y.o. female has a past medical history of Hypertension; Memory loss; GERD (gastroesophageal reflux disease); RLS (restless legs syndrome); Hypertonicity of bladder; Renal insufficiency; Other headache syndromes(339.89); Osteoporosis, unspecified; Urinary frequency; Accelerated hypertension (07/09/2013); Stage III chronic kidney disease (06/17/2014); Hip fracture requiring operative repair ([place]) (06/17/2014); Primary osteoarthritis involving multiple joints (09/24/2013); PVD (peripheral vascular disease) ([place]) (11/07/2012); Glaucoma (11/07/2012); Depression (11/07/2012); Overactive bladder (10/17/2012); and Pneumonia.   Patient presented with abdominal pain and uncontrolled back pain. Patient was admitted for further work up.  Hospital Course:  . Closed L1 vertebral fracture ([place])  -During this admission, focus was on pain management, ordered brace unfortunately not an operative candidate  . PVD (peripheral vascular disease) ([place])  -chronic stable continued Pletal  . Stage III chronic kidney disease  -chronic stable continue to monitor  . Hypokalemia  -replaced . Hypertension  -poorly controlled -Had resumed home medications . Elevated LFTs  -patient had this in the past but resolved.  -Ultrasound of abdomen notable for large stone without signs of GB wall thickening or findings suggestive of acute cholecystitis . Obstipation  -given enemas with result  Sadly, on the afternoon of 04-22-22, patient was noted to have passed at 1528, seen and evaluated by Palliative  Care physician. Family was notified   Time of death: Jul 17, 1526  Signed:  Leasha Goldberger, Orpah Melter  [facility] 04/23/15, 4:08 PM

## 2015-05-06 NOTE — Progress Notes (Signed)
Went into patient room to check on the patient and the patient was not breathing.  Checked patient and there was no pulse or heart rate.  Pronounced dead at 1528.  Lorrene Reid

## 2015-05-06 NOTE — Final Progress Note (Signed)
I had stopped to see Carrie Moran earlier this afternoon, and plan was for family meeting with her son tomorrow.  I was called back to room by her bedside nurse who went to check on her and found that she stopped breathing.  On entering room, patinet was lying in bed.  No response to verbal, tactile, or painful stimuli.  Pupils fixed and dilated. No corneal reflex present.  No palpable central or peripheral pulses.  No auscultatable cardiac or respiratory activity for greater than one minute.  Time of death 15:28.  I called and notified her son, Carrie Moran.  He reports being appreciative of care she has received this admission.  Dr. Wyline Copas notified by staff.  Micheline Rough, MD [place] [facility] [phone]

## 2015-05-06 DEATH — deceased

## 2015-05-13 ENCOUNTER — Ambulatory Visit: Payer: Medicare Other | Admitting: Internal Medicine

## 2016-08-15 IMAGING — CT CT ABD-PELV W/O CM
2 of 4 series · 17 of 46 positions shown, 19 images · non-contrast
Comparison: 06/17/2014 radiographs

CLINICAL DATA: Abdominal pain.  Leukocytosis.

EXAM:
CT ABDOMEN AND PELVIS WITHOUT CONTRAST
TECHNIQUE: Multidetector CT imaging of the abdomen and pelvis was performed
following the standard protocol without IV contrast.

[Series 2: rtn a/p w/o · axial · non-contrast · 0.68mm/px · z∈[-395,-25]mm · 14 of 82 slices shown, 16 images]
[im 4/82  soft-tissue]
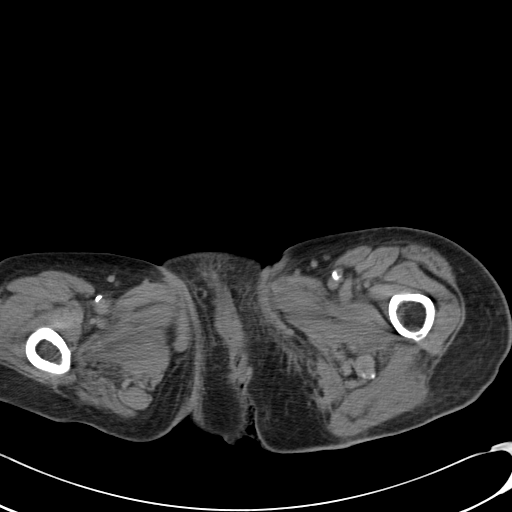
[im 4/82  bone]
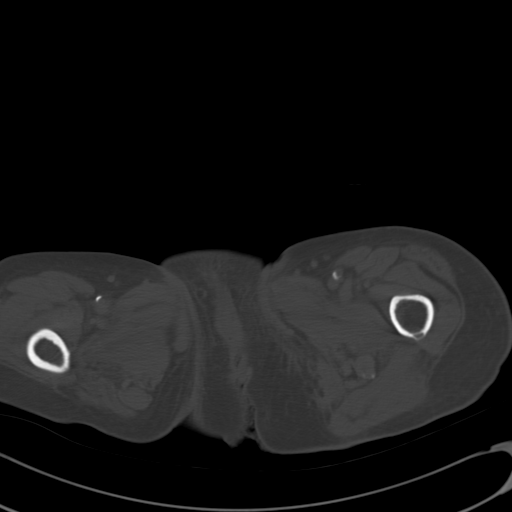
[im 10/82  soft-tissue]
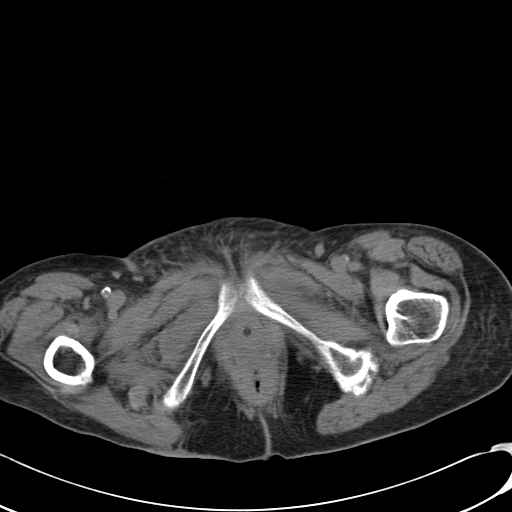
[im 17/82  soft-tissue]
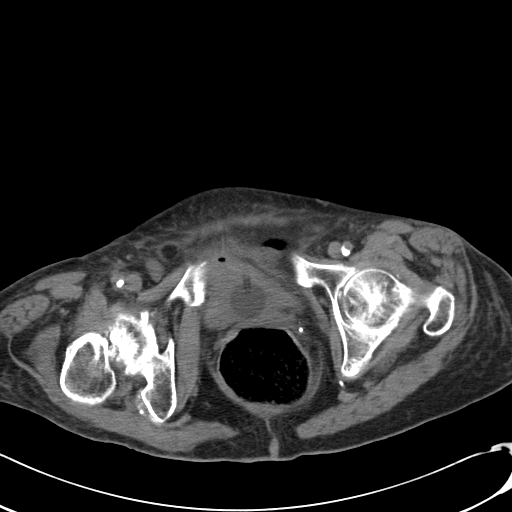
[im 23/82  soft-tissue]
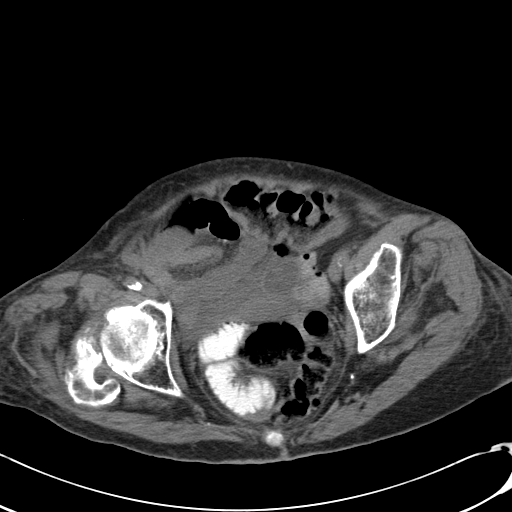
[im 26/82  soft-tissue]
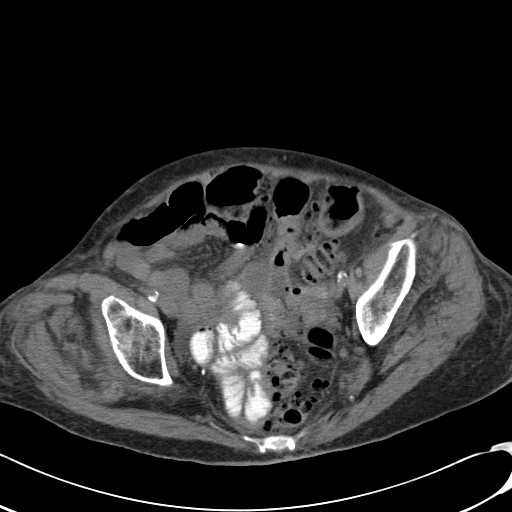
[im 33/82  soft-tissue]
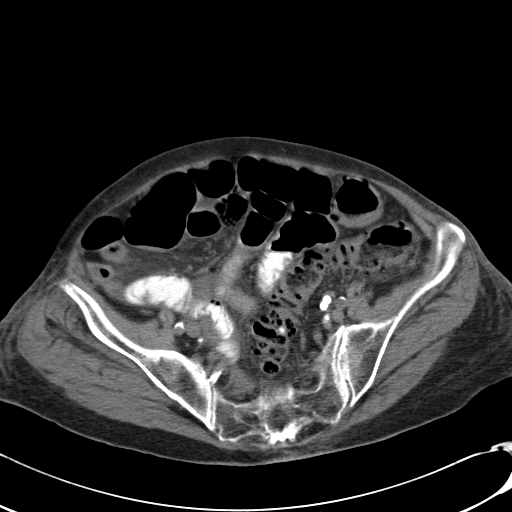
[im 39/82  soft-tissue]
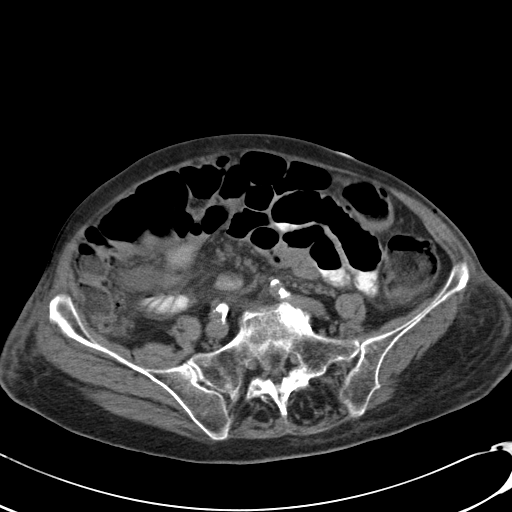
[im 43/82  soft-tissue]
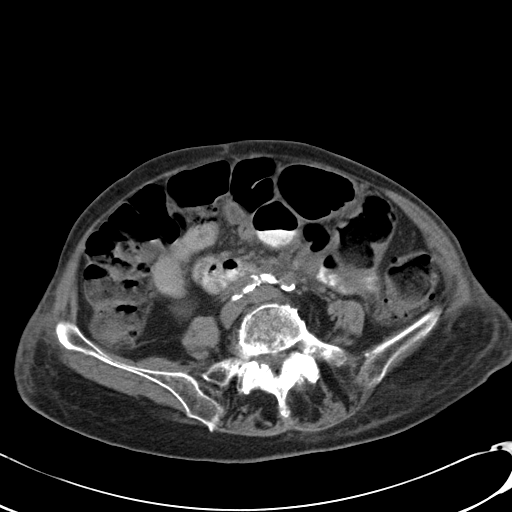
[im 49/82  soft-tissue]
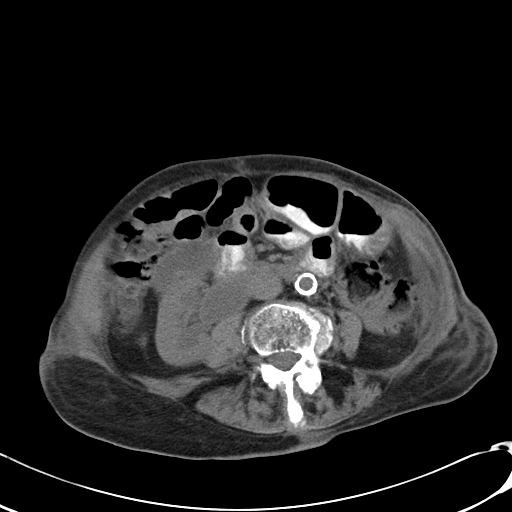
[im 49/82  bone]
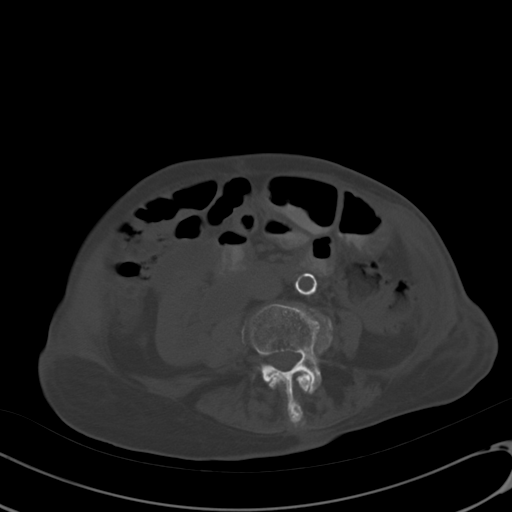
[im 56/82  soft-tissue]
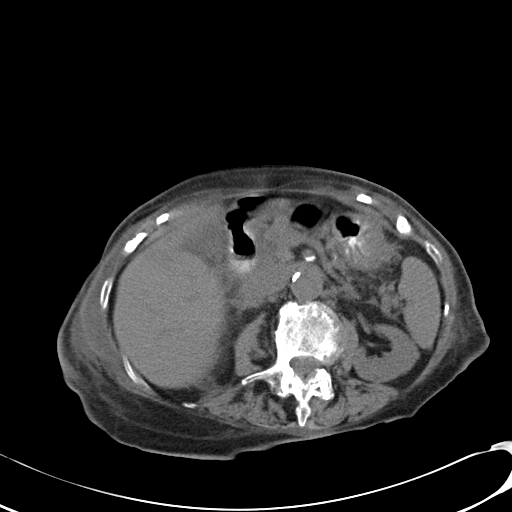
[im 62/82  soft-tissue]
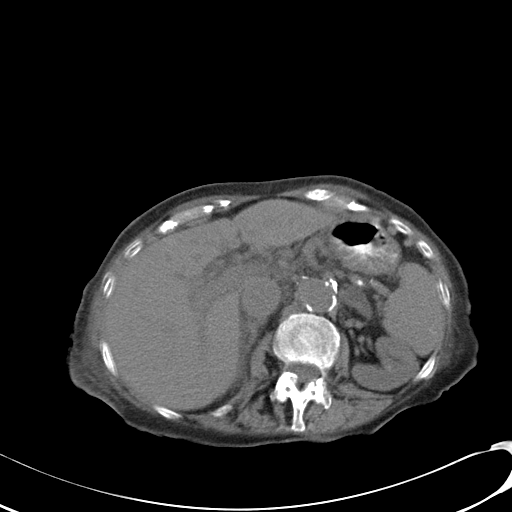
[im 65/82  soft-tissue]
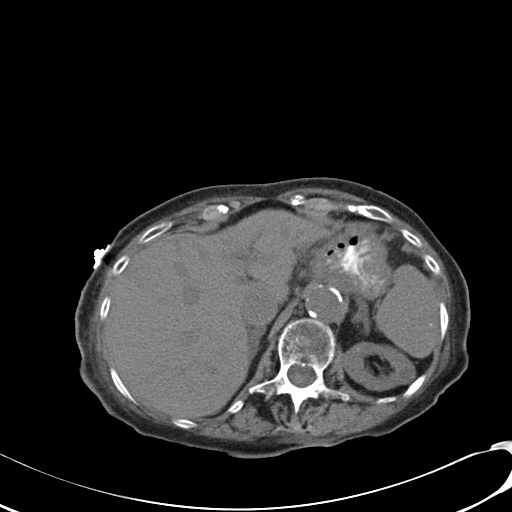
[im 72/82  soft-tissue]
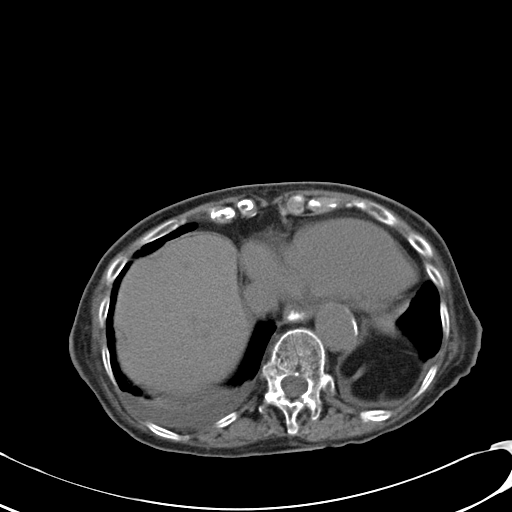
[im 78/82  soft-tissue]
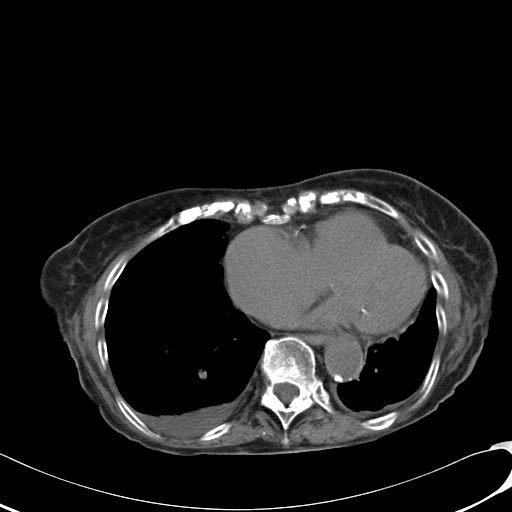

[Series 602: coronal · coronal · 0.79mm/px · 3 of 69 slices shown]
[im 23/69  soft-tissue]
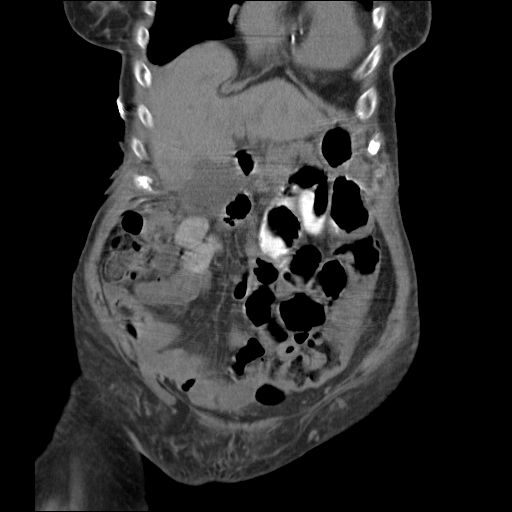
[im 31/69  soft-tissue]
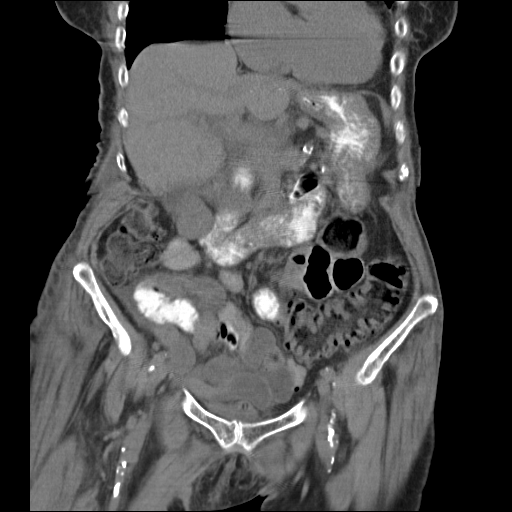
[im 38/69  soft-tissue]
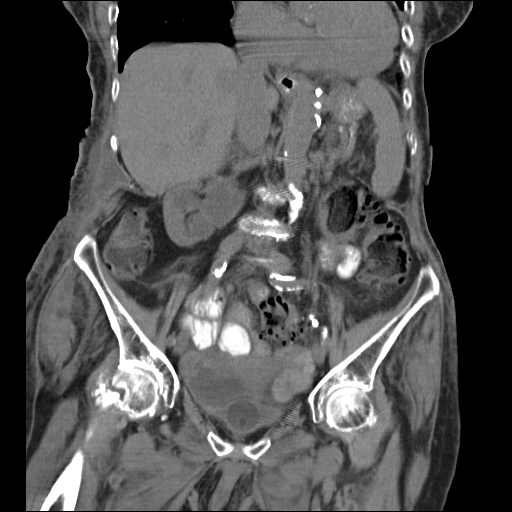

[17 of 46 positions shown; findings below may reference images not displayed]

FINDINGS: There are unremarkable unenhanced appearances of the liver, spleen,
and pancreas. There is a 2.0 cm calculus within the gallbladder
lumen. There is no bile duct dilatation. The right kidney is
hydronephrotic, with abrupt caliber transition at the ureteropelvic
junction. This may represent a chronic UPJ obstruction. No
obstructing calculus or mass is evident. The left kidney is normal.
There is no evidence of bowel obstruction or extraluminal air. There
is mild fluid and air distention of small and large bowel without
caliber transition. There is a prominent rectal stool mass which may
represent a degree of fecal impaction. There is colonic
diverticulosis. There is no evidence of diverticulitis or other
focal acute inflammatory process in the abdomen or pelvis. There is
no ascites.

In the lower chest there are small bilateral effusions, right
greater than left. Lung bases are clear.

There is a subcapital right hip fracture. No other acute
musculoskeletal abnormalities are evident. Moderately severe
degenerative disc and facet changes are present from L2 through the
sacrum.
IMPRESSION: 1. Cholelithiasis
2. Right hydronephrosis, perhaps a chronic UPJ obstruction. No
obstructing mass or calculus is evident.
3. Diverticulosis
4. The rectum is distended with stool, raising the question of fecal
impaction. There is moderate distension of all of the small and
large bowel without caliber transition.
5. Small bilateral pleural effusions
6. Subcapital right hip fracture

## 2016-08-16 IMAGING — DX DG PORTABLE PELVIS
1 series · 1 of 1 positions shown · non-contrast
Comparison: None.

CLINICAL DATA: Status post right hip replacement

EXAM:
PORTABLE PELVIS 1-2 VIEWS

[pelvis ap]
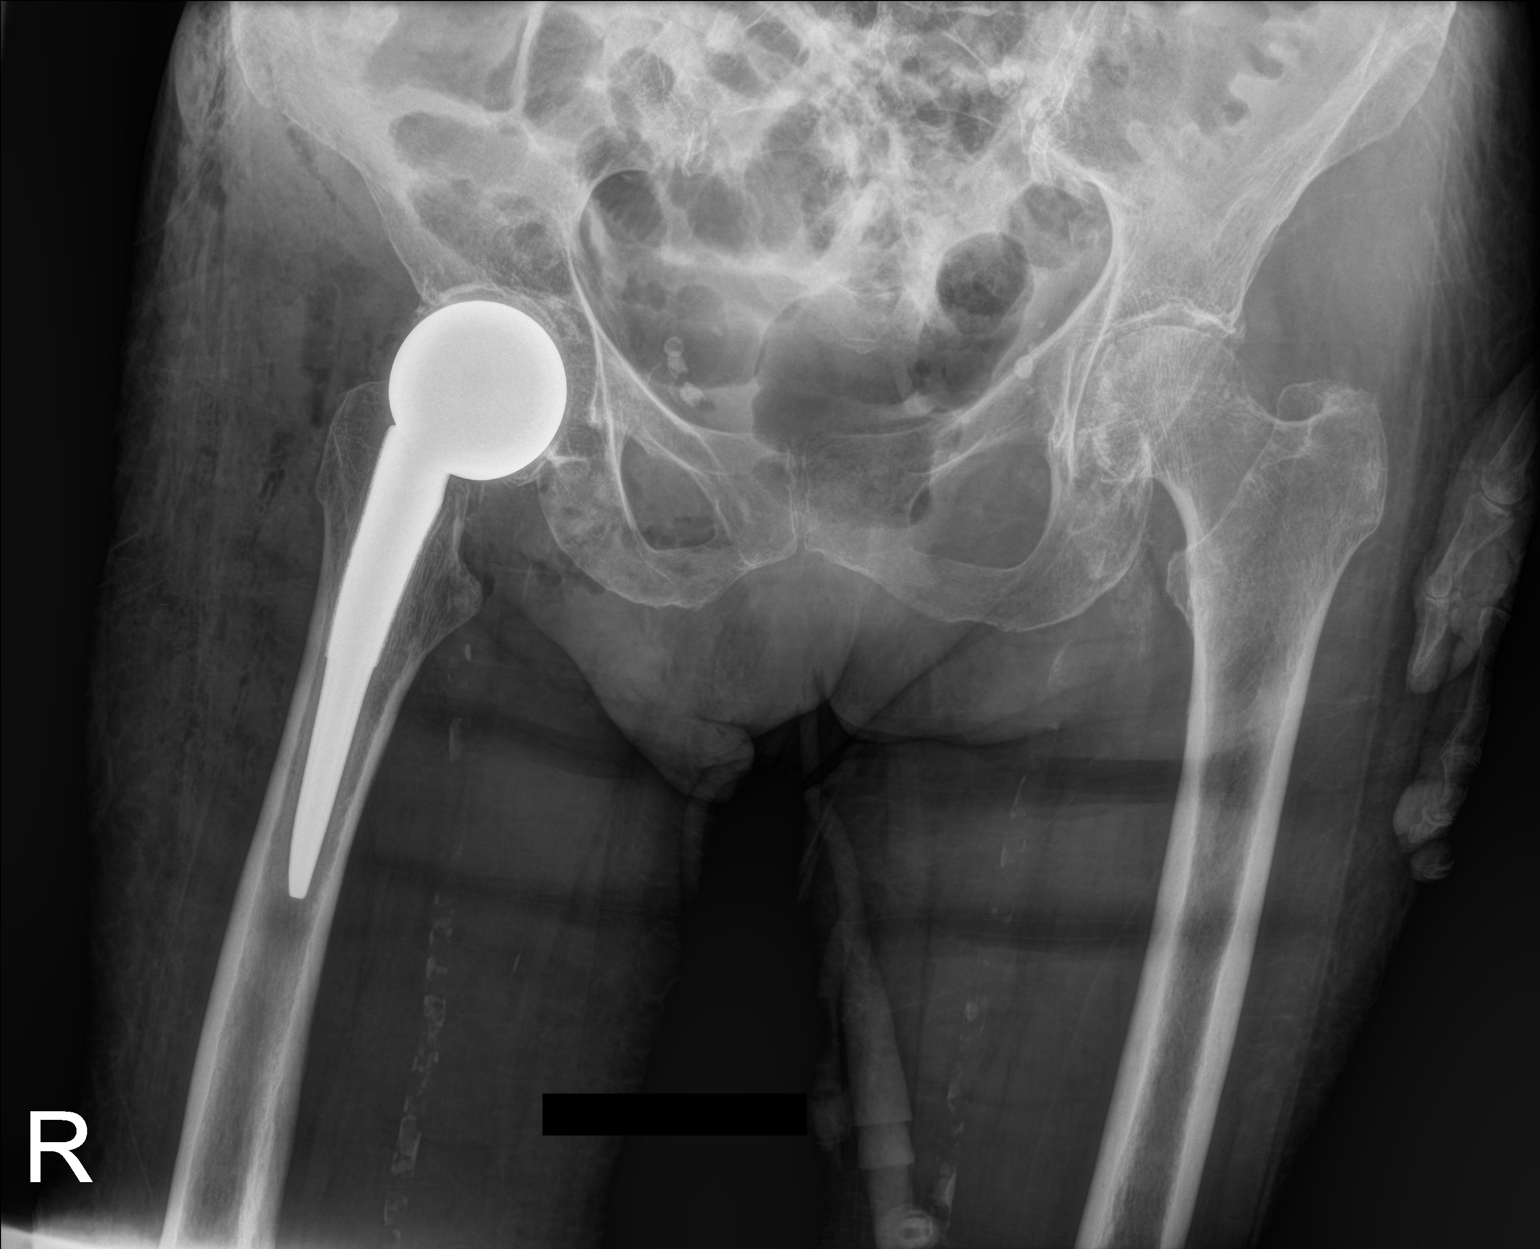

[1 of 1 positions shown; findings below may reference images not displayed]

FINDINGS: Right hip replacement is now seen. No acute bony abnormality is
seen. Soft tissue changes are noted consistent with the recent
surgery. The pelvic ring is visualized is intact.
IMPRESSION: Status post right hip replacement without acute abnormality.

## 2016-08-17 IMAGING — DX DG PELVIS 1-2V
1 series · 1 of 1 positions shown · non-contrast
Comparison: 06/19/2014

CLINICAL DATA: Postop hip replacement

EXAM:
PELVIS - 1-2 VIEW

[pelvis ap]
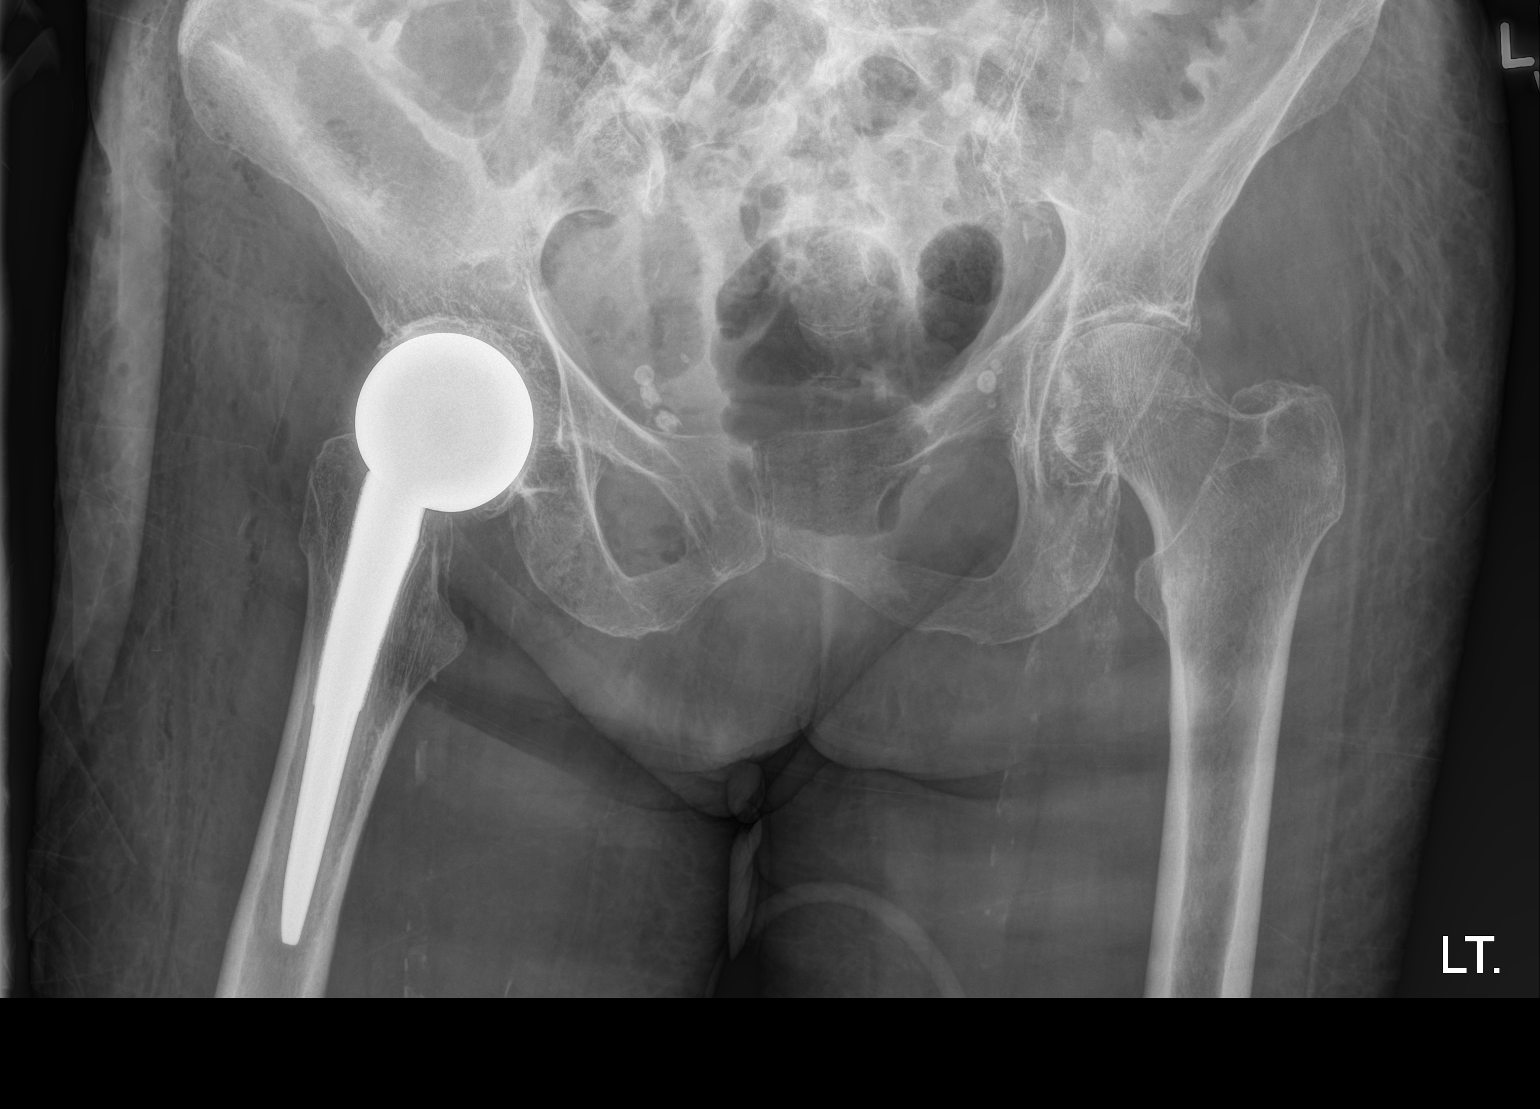

[1 of 1 positions shown; findings below may reference images not displayed]

FINDINGS: Right hip hemiarthroplasty in satisfactory position alignment. No
fracture or complication. No change from yesterday
IMPRESSION: Satisfactory right hip hemiarthroplasty.

## 2016-08-18 IMAGING — CR DG CHEST 1V PORT
1 series · 1 of 1 positions shown · non-contrast
Comparison: 06/20/2014

CLINICAL DATA: Hypertension.  Short of breath today.

EXAM:
PORTABLE CHEST - 1 VIEW

[AP]
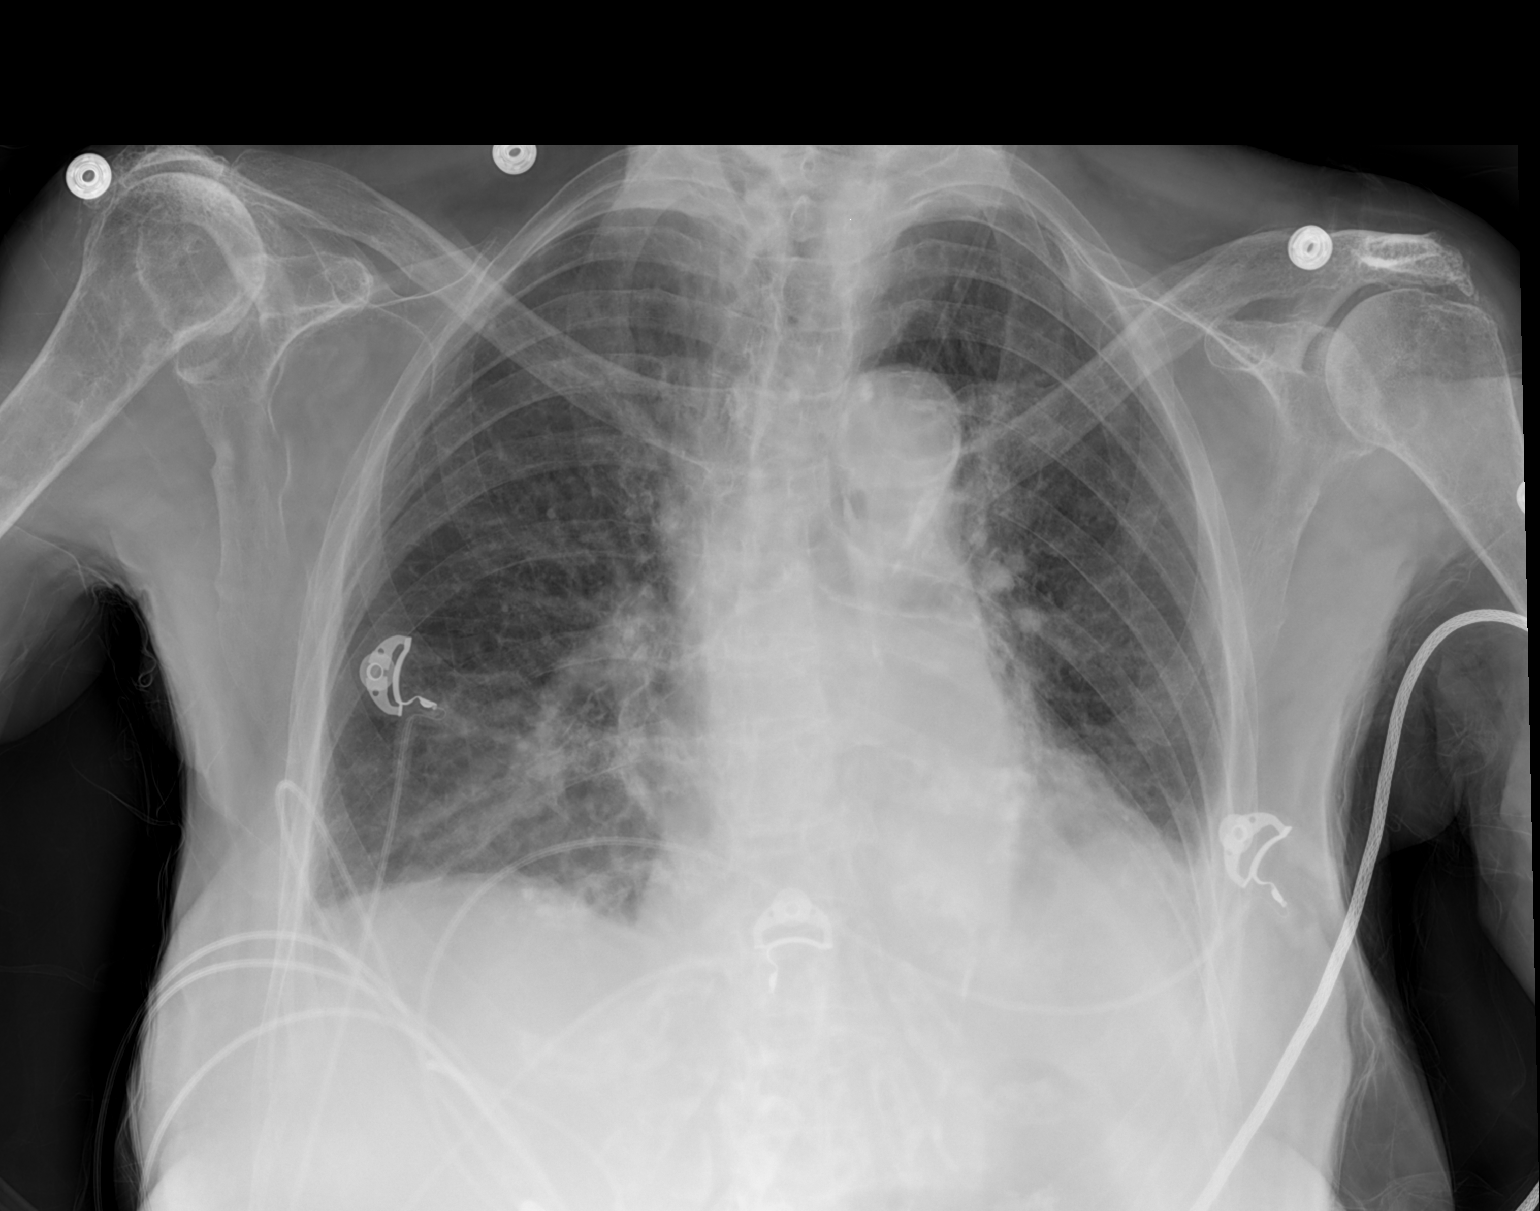

[1 of 1 positions shown; findings below may reference images not displayed]

FINDINGS: Mild enlargement of the cardiopericardial silhouette, stable. No
mediastinal or hilar masses. Mild basilar opacity likely due to
atelectasis. Possible small effusions. No overt pulmonary edema or
convincing pneumonia. No pneumothorax.

Bony thorax is demineralized but grossly intact.
IMPRESSION: Mild lung base opacity likely atelectasis. Probable associated small
effusions. No overt pulmonary edema or convincing pneumonia.
Findings are similar to the previous day's study.
